# Patient Record
Sex: Male | Born: 1955 | Race: Black or African American | Hispanic: No | Marital: Married | Smoking: Current every day smoker
Health system: Southern US, Community
[De-identification: ages and names within clinical notes are randomized; demographics above are authoritative.]

## PROBLEM LIST (undated history)

## (undated) DIAGNOSIS — F250 Schizoaffective disorder, bipolar type: Secondary | ICD-10-CM

## (undated) DIAGNOSIS — K219 Gastro-esophageal reflux disease without esophagitis: Secondary | ICD-10-CM

## (undated) DIAGNOSIS — E079 Disorder of thyroid, unspecified: Secondary | ICD-10-CM

## (undated) DIAGNOSIS — E785 Hyperlipidemia, unspecified: Secondary | ICD-10-CM

## (undated) DIAGNOSIS — K589 Irritable bowel syndrome without diarrhea: Secondary | ICD-10-CM

## (undated) DIAGNOSIS — F1910 Other psychoactive substance abuse, uncomplicated: Secondary | ICD-10-CM

## (undated) DIAGNOSIS — I10 Essential (primary) hypertension: Secondary | ICD-10-CM

## (undated) DIAGNOSIS — F259 Schizoaffective disorder, unspecified: Secondary | ICD-10-CM

---

## 1998-11-26 ENCOUNTER — Encounter: Payer: Self-pay | Admitting: Emergency Medicine

## 1998-11-26 ENCOUNTER — Emergency Department (HOSPITAL_COMMUNITY): Admission: EM | Admit: 1998-11-26 | Discharge: 1998-11-26 | Payer: Self-pay | Admitting: Emergency Medicine

## 1998-11-30 ENCOUNTER — Emergency Department (HOSPITAL_COMMUNITY): Admission: EM | Admit: 1998-11-30 | Discharge: 1998-11-30 | Payer: Self-pay | Admitting: Emergency Medicine

## 2000-12-18 ENCOUNTER — Encounter: Admission: RE | Admit: 2000-12-18 | Discharge: 2000-12-18 | Payer: Self-pay | Admitting: Nephrology

## 2000-12-18 ENCOUNTER — Encounter: Payer: Self-pay | Admitting: Nephrology

## 2001-01-31 ENCOUNTER — Encounter: Payer: Self-pay | Admitting: Nephrology

## 2001-01-31 ENCOUNTER — Ambulatory Visit (HOSPITAL_COMMUNITY): Admission: RE | Admit: 2001-01-31 | Discharge: 2001-01-31 | Payer: Self-pay | Admitting: Nephrology

## 2003-08-12 ENCOUNTER — Inpatient Hospital Stay (HOSPITAL_COMMUNITY): Admission: EM | Admit: 2003-08-12 | Discharge: 2003-08-20 | Payer: Self-pay | Admitting: Emergency Medicine

## 2004-10-11 ENCOUNTER — Emergency Department (HOSPITAL_COMMUNITY): Admission: EM | Admit: 2004-10-11 | Discharge: 2004-10-11 | Payer: Self-pay | Admitting: Emergency Medicine

## 2005-08-26 ENCOUNTER — Emergency Department (HOSPITAL_COMMUNITY): Admission: EM | Admit: 2005-08-26 | Discharge: 2005-08-26 | Payer: Self-pay | Admitting: Emergency Medicine

## 2010-06-23 ENCOUNTER — Emergency Department (HOSPITAL_COMMUNITY): Admission: EM | Admit: 2010-06-23 | Discharge: 2010-06-23 | Payer: Self-pay | Admitting: Emergency Medicine

## 2010-11-01 LAB — LIPASE, BLOOD: Lipase: 24 U/L (ref 11–59)

## 2010-11-01 LAB — CBC
HCT: 45.6 % (ref 39.0–52.0)
Hemoglobin: 15.6 g/dL (ref 13.0–17.0)
MCH: 31.2 pg (ref 26.0–34.0)
MCHC: 34.3 g/dL (ref 30.0–36.0)
MCV: 90.8 fL (ref 78.0–100.0)
Platelets: 162 10*3/uL (ref 150–400)
RBC: 5.02 MIL/uL (ref 4.22–5.81)
RDW: 13.5 % (ref 11.5–15.5)
WBC: 5.3 10*3/uL (ref 4.0–10.5)

## 2010-11-01 LAB — COMPREHENSIVE METABOLIC PANEL
ALT: 30 U/L (ref 0–53)
AST: 36 U/L (ref 0–37)
Albumin: 4 g/dL (ref 3.5–5.2)
Alkaline Phosphatase: 60 U/L (ref 39–117)
BUN: 6 mg/dL (ref 6–23)
CO2: 26 mEq/L (ref 19–32)
Calcium: 9.7 mg/dL (ref 8.4–10.5)
Chloride: 107 mEq/L (ref 96–112)
Creatinine, Ser: 1.12 mg/dL (ref 0.4–1.5)
GFR calc Af Amer: >60 mL/min (ref >60)
GFR calc non Af Amer: >60 mL/min (ref >60)
Glucose, Bld: 105 mg/dL — ABNORMAL HIGH (ref 70–99)
Potassium: 3.8 mEq/L (ref 3.5–5.1)
Sodium: 142 mEq/L (ref 135–145)
Total Bilirubin: 0.7 mg/dL (ref 0.3–1.2)
Total Protein: 6.7 g/dL (ref 6.0–8.3)

## 2010-11-01 LAB — DIFFERENTIAL
Basophils Absolute: 0 10*3/uL (ref 0.0–0.1)
Basophils Relative: 1 % (ref 0–1)
Eosinophils Absolute: 0.4 10*3/uL (ref 0.0–0.7)
Eosinophils Relative: 7 % — ABNORMAL HIGH (ref 0–5)
Lymphocytes Relative: 31 % (ref 12–46)
Lymphs Abs: 1.6 10*3/uL (ref 0.7–4.0)
Monocytes Absolute: 0.6 10*3/uL (ref 0.1–1.0)
Monocytes Relative: 11 % (ref 3–12)
Neutro Abs: 2.7 10*3/uL (ref 1.7–7.7)
Neutrophils Relative %: 51 % (ref 43–77)

## 2011-01-06 NOTE — Discharge Summary (Signed)
Anthony Solis, Anthony Solis                       ACCOUNT NO.:  [id number]   MEDICAL RECORD NO.:  [id number]                   PATIENT TYPE:  INP   LOCATION:  3018                                 FACILITY:  [facility]   PHYSICIAN:  Jarome Matin, M.D.            DATE OF BIRTH:  07/17/1978   DATE OF ADMISSION:  08/12/2003  DATE OF DISCHARGE:  08/19/2003                                 DISCHARGE SUMMARY   ADMISSION DIAGNOSIS:  Persistent wheezing bilaterally with moderate  distress.   DISCHARGE DIAGNOSES:  1. Chronic bronchitis with acute exacerbation, with exacerbation of     asthmatic wheezing probably brought on by viral or bacterial bronchitis     and also is exacerbated by continued tobacco abuse, smoking.  2. The patient also has a diagnosis of paranoid schizophrenia and he resides     at the [facility].   BRIEF HISTORY AND HOSPITAL COURSE:  This is a 55 year old, I am not sure  about his age, but he says 74, he looks a little older, African-American  male who resides at the [facility].  He has a history of being a paranoid  schizophrenic and hypothyroidism.  He said he has been short of breath for  the past two weeks with wheezing and came to the emergency room on the night  of admission.  He was wheezing bilaterally with some moderate degree of  distress.  He had been given some albuterol with a nebulizer solution, Solu-  Medrol 125 mg, and he continues to wheeze with maybe some slight  improvement.  He denies coughing up sputum or anything of that nature.  His  temperature was 98.3, blood pressure 122/81, pulse was 130, respirations 28-  24.  He had bilateral wheezing, expiratory and inspiratory.  Soft, no mass,  no organomegaly.  Persistent wheezing.   He was admitted and was given nebulizer with albuterol q.6h., Solu-Medrol  125 mg IV q.6h., and he was continued on his other medications, and his  other medications included, the patient is a paranoid schizophrenic and he  was continued on his Bentyl 20 mg q.i.d., and he also has hypothyroidism on  Synthroid 50 mcg daily.  He is on niacin 500 mg daily at night, bedtime,  Remeron 15 mg tablet at h.s., and Protonix 40 mg a.c. tabs daily.  He is  also on Clozaril 125 mg three times a day.  He was given, as stated, some  albuterol for his rescue inhaler, and today the patient's chest is clear and  we are going to continue him for another seven days on the Augmentin that he  was given for his acute bronchitis, 875 mg b.i.d. for seven more days.  Continue on his medications at home mentioned, and give him Atrovent  inhaler.  He can take two puffs of that four times a day and, as stated  before, 875 mg twice a day for another seven days.  Put  him on a prednisone  Dosepak, a 10 mg Dosepak to last about seven days.  His tuberculin skin test  was negative, and Tylenol as needed for pain and also an antacid, Maalox,  q.6h. p.r.n. for his upset stomach.  The patient can continue on that and be  seen in the office.  We are going to also give him a flu shot before he is  discharged.  I do not think he has had one, so we can schedule him to do  that before discharge, and the patient can be seen in the office in three to  four weeks as needed after discharge.  I spoke with him at length about  smoking, but he did not seem very impressed.                                                Jarome Matin, M.D.    CEF/MEDQ  D:  08/19/2003  T:  08/19/2003  Job:  [id number]

## 2012-08-20 ENCOUNTER — Encounter (HOSPITAL_COMMUNITY): Payer: Self-pay | Admitting: Emergency Medicine

## 2012-08-20 DIAGNOSIS — Z79899 Other long term (current) drug therapy: Secondary | ICD-10-CM

## 2012-08-20 DIAGNOSIS — E079 Disorder of thyroid, unspecified: Secondary | ICD-10-CM | POA: Diagnosis present

## 2012-08-20 DIAGNOSIS — K8010 Calculus of gallbladder with chronic cholecystitis without obstruction: Principal | ICD-10-CM | POA: Diagnosis present

## 2012-08-20 DIAGNOSIS — F259 Schizoaffective disorder, unspecified: Secondary | ICD-10-CM | POA: Diagnosis present

## 2012-08-20 DIAGNOSIS — K219 Gastro-esophageal reflux disease without esophagitis: Secondary | ICD-10-CM | POA: Diagnosis present

## 2012-08-20 DIAGNOSIS — K589 Irritable bowel syndrome without diarrhea: Secondary | ICD-10-CM | POA: Diagnosis present

## 2012-08-20 DIAGNOSIS — I10 Essential (primary) hypertension: Secondary | ICD-10-CM | POA: Diagnosis present

## 2012-08-20 DIAGNOSIS — F1910 Other psychoactive substance abuse, uncomplicated: Secondary | ICD-10-CM | POA: Diagnosis present

## 2012-08-20 DIAGNOSIS — E785 Hyperlipidemia, unspecified: Secondary | ICD-10-CM | POA: Diagnosis present

## 2012-08-20 LAB — CBC WITH DIFFERENTIAL/PLATELET
Basophils Absolute: 0 10*3/uL (ref 0.0–0.1)
Basophils Relative: 0 % (ref 0–1)
Eosinophils Absolute: 0.2 10*3/uL (ref 0.0–0.7)
Eosinophils Relative: 3 % (ref 0–5)
HCT: 43.9 % (ref 39.0–52.0)
Hemoglobin: 14.7 g/dL (ref 13.0–17.0)
Lymphocytes Relative: 13 % (ref 12–46)
Lymphs Abs: 1.1 10*3/uL (ref 0.7–4.0)
MCH: 29.1 pg (ref 26.0–34.0)
MCHC: 33.5 g/dL (ref 30.0–36.0)
MCV: 86.8 fL (ref 78.0–100.0)
Monocytes Absolute: 0.4 10*3/uL (ref 0.1–1.0)
Monocytes Relative: 5 % (ref 3–12)
Neutro Abs: 6.6 10*3/uL (ref 1.7–7.7)
Neutrophils Relative %: 79 % — ABNORMAL HIGH (ref 43–77)
Platelets: 141 10*3/uL — ABNORMAL LOW (ref 150–400)
RBC: 5.06 MIL/uL (ref 4.22–5.81)
RDW: 13.4 % (ref 11.5–15.5)
WBC: 8.3 10*3/uL (ref 4.0–10.5)

## 2012-08-20 NOTE — ED Notes (Signed)
PT. ARRIVED WITH EMS FROM [facility] ,  REPORTS GENERALIZED ABDOMINAL PAIN WITH DIARRHEA ONSET THIS EVENING , DENIES NAUSEA OR VOMITTING .

## 2012-08-21 ENCOUNTER — Inpatient Hospital Stay (HOSPITAL_COMMUNITY)
Admission: EM | Admit: 2012-08-21 | Discharge: 2012-08-21 | DRG: 446 | Discharge status: Against Medical Advice | Payer: Medicare Other | Attending: Emergency Medicine | Admitting: Emergency Medicine

## 2012-08-21 ENCOUNTER — Emergency Department (HOSPITAL_COMMUNITY): Payer: Medicare Other

## 2012-08-21 HISTORY — DX: Other psychoactive substance abuse, uncomplicated: F19.10

## 2012-08-21 HISTORY — DX: Gastro-esophageal reflux disease without esophagitis: K21.9

## 2012-08-21 HISTORY — DX: Essential (primary) hypertension: I10

## 2012-08-21 HISTORY — DX: Schizoaffective disorder, unspecified: F25.9

## 2012-08-21 HISTORY — DX: Schizoaffective disorder, bipolar type: F25.0

## 2012-08-21 HISTORY — DX: Irritable bowel syndrome, unspecified: K58.9

## 2012-08-21 HISTORY — DX: Hyperlipidemia, unspecified: E78.5

## 2012-08-21 HISTORY — DX: Disorder of thyroid, unspecified: E07.9

## 2012-08-21 LAB — URINALYSIS, ROUTINE W REFLEX MICROSCOPIC
Glucose, UA: NEGATIVE mg/dL
Hgb urine dipstick: NEGATIVE
Ketones, ur: NEGATIVE mg/dL
Nitrite: NEGATIVE
Protein, ur: NEGATIVE mg/dL
Specific Gravity, Urine: 1.017 (ref 1.005–1.030)
Urobilinogen, UA: 1 mg/dL (ref 0.0–1.0)
pH: 8.5 — ABNORMAL HIGH (ref 5.0–8.0)

## 2012-08-21 LAB — COMPREHENSIVE METABOLIC PANEL
ALT: 126 U/L — ABNORMAL HIGH (ref 0–53)
AST: 259 U/L — ABNORMAL HIGH (ref 0–37)
Albumin: 4.2 g/dL (ref 3.5–5.2)
Alkaline Phosphatase: 89 U/L (ref 39–117)
BUN: 9 mg/dL (ref 6–23)
CO2: 31 mEq/L (ref 19–32)
Calcium: 10.2 mg/dL (ref 8.4–10.5)
Chloride: 100 mEq/L (ref 96–112)
Creatinine, Ser: 1.15 mg/dL (ref 0.50–1.35)
GFR calc Af Amer: 82 mL/min — ABNORMAL LOW (ref >90)
GFR calc non Af Amer: 70 mL/min — ABNORMAL LOW (ref >90)
Glucose, Bld: 154 mg/dL — ABNORMAL HIGH (ref 70–99)
Potassium: 3.3 mEq/L — ABNORMAL LOW (ref 3.5–5.1)
Sodium: 143 mEq/L (ref 135–145)
Total Bilirubin: 1.5 mg/dL — ABNORMAL HIGH (ref 0.3–1.2)
Total Protein: 7 g/dL (ref 6.0–8.3)

## 2012-08-21 LAB — URINE MICROSCOPIC-ADD ON

## 2012-08-21 LAB — LIPASE, BLOOD: Lipase: 28 U/L (ref 11–59)

## 2012-08-21 MED ORDER — MORPHINE SULFATE 4 MG/ML IJ SOLN
4.0000 mg | Freq: Once | INTRAMUSCULAR | Status: AC
  Administered 2012-08-21: 4 mg via INTRAVENOUS
  Filled 2012-08-21: qty 1

## 2012-08-21 MED ORDER — DEXTROSE 5 % IV SOLN
2.0000 g | INTRAVENOUS | Status: AC
  Administered 2012-08-21: 2 g via INTRAVENOUS
  Filled 2012-08-21: qty 2

## 2012-08-21 MED ORDER — DEXTROSE 5 % IV SOLN
2.0000 g | Freq: Two times a day (BID) | INTRAVENOUS | Status: DC
  Filled 2012-08-21 (×2): qty 2

## 2012-08-21 MED ORDER — FENTANYL CITRATE 0.05 MG/ML IJ SOLN
100.0000 ug | Freq: Once | INTRAMUSCULAR | Status: AC
  Administered 2012-08-21: 100 ug via INTRAVENOUS
  Filled 2012-08-21: qty 2

## 2012-08-21 NOTE — ED Notes (Signed)
RN called [facility] to transport patient

## 2012-08-21 NOTE — ED Notes (Signed)
Patient transported to Ultrasound 

## 2012-08-21 NOTE — H&P (Signed)
Anthony Solis is an 57 y.o. male.   Chief Complaint: Abdominal pain HPI: This is a 57 year old gentleman schizoaffective disorder That presents with upper abdominal pain. He is a very poor historian. The pain started last night. He has had chronic abdominal complaints for some time he reports that he has gastroenteritis. He has no nausea or vomiting. Bowel movements have been loose. He denies fevers.  Past Medical History  Diagnosis Date  . IBS (irritable bowel syndrome)   . Thyroid disease   . Schizophrenia, schizo-affective type   . Hypertension   . Polysubstance abuse   . GERD (gastroesophageal reflux disease)   . Hyperlipidemia   . Drug abuse     History reviewed. No pertinent past surgical history.  No family history on file. Social History:  reports that he has never smoked. He does not have any smokeless tobacco history on file. He reports that he does not drink alcohol or use illicit drugs.  Allergies: No Known Allergies   (Not in a hospital admission)  Results for orders placed during the hospital encounter of 08/21/12 (from the past 48 hour(s))  CBC WITH DIFFERENTIAL     Status: Abnormal   Collection Time   08/20/12 11:20 PM      Component Value Range Comment   WBC 8.3  4.0 - 10.5 K/uL    RBC 5.06  4.22 - 5.81 MIL/uL    Hemoglobin 14.7  13.0 - 17.0 g/dL    HCT 16.1  09.6 - 04.5 %    MCV 86.8  78.0 - 100.0 fL    MCH 29.1  26.0 - 34.0 pg    MCHC 33.5  30.0 - 36.0 g/dL    RDW 40.9  81.1 - 91.4 %    Platelets 141 (*) 150 - 400 K/uL    Neutrophils Relative 79 (*) 43 - 77 %    Neutro Abs 6.6  1.7 - 7.7 K/uL    Lymphocytes Relative 13  12 - 46 %    Lymphs Abs 1.1  0.7 - 4.0 K/uL    Monocytes Relative 5  3 - 12 %    Monocytes Absolute 0.4  0.1 - 1.0 K/uL    Eosinophils Relative 3  0 - 5 %    Eosinophils Absolute 0.2  0.0 - 0.7 K/uL    Basophils Relative 0  0 - 1 %    Basophils Absolute 0.0  0.0 - 0.1 K/uL   COMPREHENSIVE METABOLIC PANEL     Status: Abnormal   Collection Time   08/20/12 11:20 PM      Component Value Range Comment   Sodium 143  135 - 145 mEq/L    Potassium 3.3 (*) 3.5 - 5.1 mEq/L    Chloride 100  96 - 112 mEq/L    CO2 31  19 - 32 mEq/L    Glucose, Bld 154 (*) 70 - 99 mg/dL    BUN 9  6 - 23 mg/dL    Creatinine, Ser 7.82  0.50 - 1.35 mg/dL    Calcium 95.6  8.4 - 10.5 mg/dL    Total Protein 7.0  6.0 - 8.3 g/dL    Albumin 4.2  3.5 - 5.2 g/dL    AST 213 (*) 0 - 37 U/L    ALT 126 (*) 0 - 53 U/L    Alkaline Phosphatase 89  39 - 117 U/L    Total Bilirubin 1.5 (*) 0.3 - 1.2 mg/dL    GFR calc non Af Denyse Dago  70 (*) >90 mL/min    GFR calc Af Amer 82 (*) >90 mL/min   LIPASE, BLOOD     Status: Normal   Collection Time   08/20/12 11:20 PM      Component Value Range Comment   Lipase 28  11 - 59 U/L   URINALYSIS, ROUTINE W REFLEX MICROSCOPIC     Status: Abnormal   Collection Time   08/21/12  4:56 AM      Component Value Range Comment   Color, Urine AMBER (*) YELLOW BIOCHEMICALS MAY BE AFFECTED BY COLOR   APPearance CLEAR  CLEAR    Specific Gravity, Urine 1.017  1.005 - 1.030    pH 8.5 (*) 5.0 - 8.0    Glucose, UA NEGATIVE  NEGATIVE mg/dL    Hgb urine dipstick NEGATIVE  NEGATIVE    Bilirubin Urine SMALL (*) NEGATIVE    Ketones, ur NEGATIVE  NEGATIVE mg/dL    Protein, ur NEGATIVE  NEGATIVE mg/dL    Urobilinogen, UA 1.0  0.0 - 1.0 mg/dL    Nitrite NEGATIVE  NEGATIVE    Leukocytes, UA TRACE (*) NEGATIVE   URINE MICROSCOPIC-ADD ON     Status: Normal   Collection Time   08/21/12  4:56 AM      Component Value Range Comment   Squamous Epithelial / LPF RARE  RARE    WBC, UA 3-6  <3 WBC/hpf    RBC / HPF 0-2  <3 RBC/hpf    Bacteria, UA RARE  RARE    US Abdomen Complete  08/21/2012  *RADIOLOGY REPORT*  Clinical Data:  Abdominal pain.  Elevated LFTs.  History of hypertension, IBS, GERD.  COMPLETE ABDOMINAL ULTRASOUND  Comparison:  Ultrasound the abdomen on 06/23/2010  Findings:  Gallbladder:  The gallbladder is contracted with thickened wall.  Wall measures 4.2 mm.  An echogenic focus is identified within the fundus, possibly representing a stone and measuring 3.9 mm.  No sonographic Murphy's sign identified.  Common bile duct:  3.9 mm  Liver:  Liver is echogenic without focal mass.  IVC:  There is limited visualization of the pancreas because of overlying bowel gas.  No focal abnormality identified however.  Pancreas:  No focal abnormality seen.  Spleen:  The spleen has a normal appearance, 9.3 cm in length.  Right Kidney:  The right kidney has a normal appearance, 11.3 cm in length.  Left Kidney:  The left kidney is 11.0 cm in length and has a normal appearance.  Abdominal aorta:  2.0 cm maximum diameter, normal in appearance.  IMPRESSION:  1.  Contracted gallbladder with thickened wall. 2.  Possible gallstone in the fundal region. 3.  No sonographic Murphy's sign.   Original Report Authenticated By: Norva Pavlov, M.D.     Review of Systems  Unable to perform ROS: mental acuity  Respiratory: Negative for shortness of breath.   Cardiovascular: Negative for chest pain.    Blood pressure 112/84, pulse 98, temperature 98.3 F (36.8 C), temperature source Oral, resp. rate 19, SpO2 97.00%. Physical Exam  Constitutional: He is oriented to person, place, and time. He appears well-developed and well-nourished. No distress.  HENT:  Head: Normocephalic and atraumatic.  Mouth/Throat: Oropharynx is clear and moist.       Poor dentition  Eyes: Conjunctivae normal are normal. Pupils are equal, round, and reactive to light. Right eye exhibits no discharge. Left eye exhibits no discharge. No scleral icterus.  Neck: Normal range of motion. Neck supple. No tracheal deviation present.  Cardiovascular: Normal rate, regular rhythm, normal heart sounds and intact distal pulses.   No murmur heard. Respiratory: Effort normal and breath sounds normal. No respiratory distress.  GI: Soft. There is tenderness. There is guarding.       Abdomen is soft and  mildly obese with some mild to moderate right upper quadrant and epigastric tenderness with guarding  Musculoskeletal: Normal range of motion. He exhibits no edema.  Neurological: He is alert and oriented to person, place, and time.  Skin: Skin is warm and dry. He is not diaphoretic. No erythema.  Psychiatric:       Confused and repetitive, but apparently baseline     Assessment/Plan Acute cholecystitis with cholelithiasis  He will be admitted to the hospital for IV antibiotics and IV rehydration. His liver function tests will be repeated in the morning. He may need a GI consultation if they are increased. If not, he will be considered for a laparoscopic cholecystectomy. Currently, he may refuse surgery.  Radwan Cowley A 08/21/2012, 8:54 AM

## 2012-08-21 NOTE — ED Notes (Addendum)
Pt refuses to be admitted. Rn called group home and pt has no POA and makes all his own decisions.  MD and RN discussed pt leaving AMA and pt still refusing to stay.

## 2012-08-21 NOTE — ED Provider Notes (Signed)
History     CSN: [id number]  Arrival date & time 08/20/12  2315   First MD Initiated Contact with Patient 08/21/12 0144      Chief Complaint  Patient presents with  . Abdominal Pain    (Consider location/radiation/quality/duration/timing/severity/associated sxs/prior treatment) HPI T18-year-old male [facility] with complaints of generalized abdominal pain and some loose stools starting this evening. Patient reports pain is sharp radiates throughout his abdomen. He denies nausea or vomiting he has had no fevers. No sick contacts. No unusual foods. Patient reports he had gastroenteritis 3-4 months ago. Patient reports pain started about 5 hours ago. He waited an hour before letting anyone know about it as he was hoping it would go away on its own. No prior abdominal surgeries. Patient has history of schizoaffective/schizophrenia, and is a poor historian Past Medical History  Diagnosis Date  . IBS (irritable bowel syndrome)   . Thyroid disease   . Schizophrenia, schizo-affective type   . Hypertension   . Polysubstance abuse   . GERD (gastroesophageal reflux disease)   . Hyperlipidemia   . Drug abuse     History reviewed. No pertinent past surgical history.  No family history on file.  History  Substance Use Topics  . Smoking status: Never Smoker   . Smokeless tobacco: Not on file  . Alcohol Use: No      Review of Systems  Unable to perform ROS: Psychiatric disorder    Allergies  Review of patient's allergies indicates no known allergies.  Home Medications  No current outpatient prescriptions on file.  BP 102/70  Pulse 102  Temp 98.3 F (36.8 C) (Oral)  Resp 20  SpO2 100%  Physical Exam  Nursing note and vitals reviewed. Constitutional: He is oriented to person, place, and time. He appears well-developed and well-nourished. He appears distressed (uncomfortable appearing).  HENT:  Head: Normocephalic and  atraumatic.  Nose: Nose normal.  Mouth/Throat: Oropharynx is clear and moist.       Poor dentition  Eyes: Conjunctivae normal and EOM are normal. Pupils are equal, round, and reactive to light.  Neck: Normal range of motion. Neck supple. No JVD present. No tracheal deviation present. No thyromegaly present.  Cardiovascular: Normal rate, regular rhythm, normal heart sounds and intact distal pulses.  Exam reveals no gallop and no friction rub.   No murmur heard. Pulmonary/Chest: Effort normal and breath sounds normal. No stridor. No respiratory distress. He has no wheezes. He has no rales. He exhibits no tenderness.  Abdominal: Soft. Bowel sounds are normal. He exhibits no distension and no mass. There is tenderness (diffuse tenderness throughout the abdomen with increased tenderness across upper abdomen right upper quadrant and epigastrium). There is no rebound and no guarding.  Musculoskeletal: Normal range of motion. He exhibits no edema and no tenderness.  Lymphadenopathy:    He has no cervical adenopathy.  Neurological: He is alert and oriented to person, place, and time. He exhibits normal muscle tone. Coordination normal.  Skin: Skin is warm and dry. No rash noted. No erythema. No pallor.  Psychiatric: He has a normal mood and affect. His behavior is normal. Judgment and thought content normal.    ED Course  Procedures (including critical care time)  Labs Reviewed  CBC WITH DIFFERENTIAL - Abnormal; Notable for the following:    Platelets 141 (*)     Neutrophils Relative 79 (*)     All other components within normal limits  COMPREHENSIVE  METABOLIC PANEL - Abnormal; Notable for the following:    Potassium 3.3 (*)     Glucose, Bld 154 (*)     AST 259 (*)     ALT 126 (*)     Total Bilirubin 1.5 (*)     GFR calc non Af Amer 70 (*)     GFR calc Af Amer 82 (*)     All other components within normal limits  URINALYSIS, ROUTINE W REFLEX MICROSCOPIC  LIPASE, BLOOD   No results  found.   No diagnosis found.    MDM  57 year old male with transaminitis and abdominal pain. Concern for cholelithiasis. Lipase is pending. Treat pain, get ultrasound of abdomen.  U/s pending, Care passed to Dr Radford Pax awaiting u/s.          Olivia Mackie, MD 08/21/12 2104

## 2013-09-26 ENCOUNTER — Encounter (HOSPITAL_COMMUNITY): Payer: Self-pay | Admitting: Emergency Medicine

## 2013-09-26 ENCOUNTER — Emergency Department (HOSPITAL_COMMUNITY)
Admission: EM | Admit: 2013-09-26 | Discharge: 2013-09-26 | Disposition: A | Discharge status: Home / Self Care | Payer: Medicare Other | Attending: Emergency Medicine | Admitting: Emergency Medicine

## 2013-09-26 ENCOUNTER — Emergency Department (HOSPITAL_COMMUNITY): Payer: Medicare Other

## 2013-09-26 DIAGNOSIS — J209 Acute bronchitis, unspecified: Secondary | ICD-10-CM | POA: Insufficient documentation

## 2013-09-26 DIAGNOSIS — E079 Disorder of thyroid, unspecified: Secondary | ICD-10-CM | POA: Insufficient documentation

## 2013-09-26 DIAGNOSIS — F259 Schizoaffective disorder, unspecified: Secondary | ICD-10-CM | POA: Insufficient documentation

## 2013-09-26 DIAGNOSIS — Z79899 Other long term (current) drug therapy: Secondary | ICD-10-CM | POA: Insufficient documentation

## 2013-09-26 DIAGNOSIS — E785 Hyperlipidemia, unspecified: Secondary | ICD-10-CM | POA: Insufficient documentation

## 2013-09-26 DIAGNOSIS — K219 Gastro-esophageal reflux disease without esophagitis: Secondary | ICD-10-CM | POA: Insufficient documentation

## 2013-09-26 DIAGNOSIS — F17200 Nicotine dependence, unspecified, uncomplicated: Secondary | ICD-10-CM | POA: Insufficient documentation

## 2013-09-26 DIAGNOSIS — I10 Essential (primary) hypertension: Secondary | ICD-10-CM | POA: Insufficient documentation

## 2013-09-26 DIAGNOSIS — K5900 Constipation, unspecified: Secondary | ICD-10-CM | POA: Insufficient documentation

## 2013-09-26 DIAGNOSIS — Z720 Tobacco use: Secondary | ICD-10-CM

## 2013-09-26 LAB — CBC WITH DIFFERENTIAL/PLATELET
Basophils Absolute: 0 10*3/uL (ref 0.0–0.1)
Basophils Relative: 0 % (ref 0–1)
EOS PCT: 0 % (ref 0–5)
Eosinophils Absolute: 0 10*3/uL (ref 0.0–0.7)
HEMATOCRIT: 42.7 % (ref 39.0–52.0)
HEMOGLOBIN: 15.1 g/dL (ref 13.0–17.0)
LYMPHS ABS: 1.6 10*3/uL (ref 0.7–4.0)
Lymphocytes Relative: 17 % (ref 12–46)
MCH: 31.3 pg (ref 26.0–34.0)
MCHC: 35.4 g/dL (ref 30.0–36.0)
MCV: 88.4 fL (ref 78.0–100.0)
MONOS PCT: 16 % — AB (ref 3–12)
Monocytes Absolute: 1.6 10*3/uL — ABNORMAL HIGH (ref 0.1–1.0)
NEUTROS ABS: 6.5 10*3/uL (ref 1.7–7.7)
Neutrophils Relative %: 67 % (ref 43–77)
Platelets: 115 10*3/uL — ABNORMAL LOW (ref 150–400)
RBC: 4.83 MIL/uL (ref 4.22–5.81)
RDW: 13.2 % (ref 11.5–15.5)
WBC: 9.7 10*3/uL (ref 4.0–10.5)

## 2013-09-26 LAB — COMPREHENSIVE METABOLIC PANEL
ALT: 41 U/L (ref 0–53)
AST: 116 U/L — ABNORMAL HIGH (ref 0–37)
Albumin: 3.8 g/dL (ref 3.5–5.2)
Alkaline Phosphatase: 38 U/L — ABNORMAL LOW (ref 39–117)
BILIRUBIN TOTAL: 1.5 mg/dL — AB (ref 0.3–1.2)
BUN: 19 mg/dL (ref 6–23)
CALCIUM: 9.4 mg/dL (ref 8.4–10.5)
CO2: 28 meq/L (ref 19–32)
CREATININE: 1.23 mg/dL (ref 0.50–1.35)
Chloride: 92 mEq/L — ABNORMAL LOW (ref 96–112)
GFR, EST AFRICAN AMERICAN: 75 mL/min — AB (ref >90)
GFR, EST NON AFRICAN AMERICAN: 64 mL/min — AB (ref >90)
GLUCOSE: 134 mg/dL — AB (ref 70–99)
Potassium: 3.3 mEq/L — ABNORMAL LOW (ref 3.7–5.3)
SODIUM: 135 meq/L — AB (ref 137–147)
Total Protein: 7.3 g/dL (ref 6.0–8.3)

## 2013-09-26 MED ORDER — ALBUTEROL SULFATE (2.5 MG/3ML) 0.083% IN NEBU
2.5000 mg | INHALATION_SOLUTION | Freq: Once | RESPIRATORY_TRACT | Status: AC
  Administered 2013-09-26: 2.5 mg via RESPIRATORY_TRACT
  Filled 2013-09-26: qty 3

## 2013-09-26 MED ORDER — IPRATROPIUM BROMIDE 0.02 % IN SOLN
0.5000 mg | Freq: Once | RESPIRATORY_TRACT | Status: AC
  Administered 2013-09-26: 0.5 mg via RESPIRATORY_TRACT
  Filled 2013-09-26: qty 2.5

## 2013-09-26 MED ORDER — MAGNESIUM CITRATE PO SOLN
1.0000 | Freq: Once | ORAL | Status: AC
  Administered 2013-09-26: 1 via ORAL
  Filled 2013-09-26 (×2): qty 296

## 2013-09-26 MED ORDER — PREDNISONE 20 MG PO TABS
60.0000 mg | ORAL_TABLET | Freq: Once | ORAL | Status: AC
Start: 2013-09-26 — End: 2013-09-26
  Administered 2013-09-26: 60 mg via ORAL
  Filled 2013-09-26: qty 3

## 2013-09-26 MED ORDER — PREDNISONE 20 MG PO TABS
60.0000 mg | ORAL_TABLET | Freq: Every day | ORAL | Status: DC
Start: 2013-09-26 — End: 2016-12-05

## 2013-09-26 MED ORDER — BENZONATATE 100 MG PO CAPS
200.0000 mg | ORAL_CAPSULE | Freq: Three times a day (TID) | ORAL | Status: DC | PRN
  Administered 2013-09-26: 200 mg via ORAL
  Filled 2013-09-26 (×2): qty 2

## 2013-09-26 MED ORDER — BENZONATATE 200 MG PO CAPS: 200.0000 mg | ORAL_CAPSULE | Freq: Three times a day (TID) | ORAL | Status: DC | PRN

## 2013-09-26 MED ORDER — POLYETHYLENE GLYCOL 3350 17 G PO PACK: 17.0000 g | PACK | Freq: Every day | ORAL | Status: DC | PRN

## 2013-09-26 MED ORDER — DM-GUAIFENESIN ER 30-600 MG PO TB12: 1.0000 | ORAL_TABLET | Freq: Two times a day (BID) | ORAL | Status: DC

## 2013-09-26 MED ORDER — FLEET ENEMA 7-19 GM/118ML RE ENEM
1.0000 | ENEMA | Freq: Once | RECTAL | Status: AC
  Administered 2013-09-26: 1 via RECTAL
  Filled 2013-09-26: qty 1

## 2013-09-26 MED ORDER — SENNA-DOCUSATE SODIUM 8.6-50 MG PO TABS: 2.0000 | ORAL_TABLET | Freq: Every day | ORAL | Status: DC

## 2013-09-26 MED ORDER — ALBUTEROL SULFATE HFA 108 (90 BASE) MCG/ACT IN AERS
1.0000 | INHALATION_SPRAY | RESPIRATORY_TRACT | Status: DC | PRN
  Administered 2013-09-26: 2 via RESPIRATORY_TRACT
  Filled 2013-09-26: qty 6.7

## 2013-09-26 NOTE — ED Notes (Signed)
Enema given lying on his left side.  Instructed to hold it ASAP

## 2013-09-26 NOTE — ED Notes (Signed)
Ambulated to the BR without difficulty.

## 2013-09-26 NOTE — ED Notes (Signed)
Patient transported to CT 

## 2013-09-26 NOTE — Discharge Instructions (Signed)
Acute Bronchitis Bronchitis is when the airways that extend from the windpipe into the lungs get red, puffy, and painful (inflamed). Bronchitis often causes thick spit (mucus) to develop. This leads to a cough. A cough is the most common symptom of bronchitis. In acute bronchitis, the condition usually begins suddenly and goes away over time (usually in 2 weeks). Smoking, allergies, and asthma can make bronchitis worse. Repeated episodes of bronchitis may cause more lung problems. HOME CARE  Rest.  Drink enough fluids to keep your pee (urine) clear or pale yellow (unless you need to limit fluids as told by your doctor).  Only take over-the-counter or prescription medicines as told by your doctor.  Avoid smoking and secondhand smoke. These can make bronchitis worse. If you are a smoker, think about using nicotine gum or skin patches. Quitting smoking will help your lungs heal faster.  Reduce the chance of getting bronchitis again by:  Washing your hands often.  Avoiding people with cold symptoms.  Trying not to touch your hands to your mouth, nose, or eyes.  Follow up with your doctor as told. GET HELP IF: Your symptoms do not improve after 1 week of treatment. Symptoms include:  Cough.  Fever.  Coughing up thick spit.  Body aches.  Chest congestion.  Chills.  Shortness of breath.  Sore throat. GET HELP RIGHT AWAY IF:   You have an increased fever.  You have chills.  You have severe shortness of breath.  You have bloody thick spit (sputum).  You throw up (vomit) often.  You lose too much body fluid (dehydration).  You have a severe headache.  You faint. MAKE SURE YOU:   Understand these instructions.  Will watch your condition.  Will get help right away if you are not doing well or get worse. Document Released: 01/24/2008 Document Revised: 04/09/2013 Document Reviewed: 01/28/2013 [facility] Patient Information 2014 [place], [place].  Constipation,  Adult Constipation is when a person:  Poops (bowel movement) less than 3 times a week.  Has a hard time pooping.  Has poop that is dry, hard, or bigger than normal. HOME CARE   Eat more fiber, such as fruits, vegetables, whole grains like brown rice, and beans.  Eat less fatty foods and sugar. This includes Jamaica[place] fries, hamburgers, cookies, candy, and soda.  If you are not getting enough fiber from food, take products with added fiber in them (supplements).  Drink enough fluid to keep your pee (urine) clear or pale yellow.  Go to the restroom when you feel like you need to poop. Do not hold it.  Only take medicine as told by your doctor. Do not take medicines that help you poop (laxatives) without talking to your doctor first.  Exercise on a regular basis, or as told by your doctor. GET HELP RIGHT AWAY IF:   You have bright red blood in your poop (stool).  Your constipation lasts more than 4 days or gets worse.  You have belly (abdomen) or butt (rectal) pain.  You have thin poop (as thin as a pencil).  You lose weight, and it cannot be explained. MAKE SURE YOU:   Understand these instructions.  Will watch your condition.  Will get help right away if you are not doing well or get worse. Document Released: 01/24/2008 Document Revised: 10/30/2011 Document Reviewed: 05/19/2013 [facility] Patient Information 2014 [place], [place].

## 2013-09-26 NOTE — ED Notes (Signed)
[facility].

## 2013-09-26 NOTE — ED Notes (Signed)
Patient presents with cough and congestion.  Also states he hasn't had a BM times 3 days, abd distended per patient.

## 2013-09-26 NOTE — ED Provider Notes (Signed)
CSN: [id number]     Arrival date & time 09/26/13  0307 History   First MD Initiated Contact with Patient 09/26/13 [phone]     Chief Complaint  Patient presents with  . Cough  . Abdominal Pain   (Consider location/radiation/quality/duration/timing/severity/associated sxs/prior Treatment) HPI 58 yo male presents to the ER from home with complaint of 2-3 days of cough, congestion, intermittent low grade fevers, and constipation.  Pt has taken tylenol with some relief.  No reported sick contacts.  Cough is productive of yellow sputum.  Pt is 1ppd smoker.  No abdominal pain, no dysuria.  Some mild abd distension.  No meds for constipation taken.    Past Medical History  Diagnosis Date  . IBS (irritable bowel syndrome)   . Thyroid disease   . Schizophrenia, schizo-affective type   . Hypertension   . Polysubstance abuse   . GERD (gastroesophageal reflux disease)   . Hyperlipidemia   . Drug abuse    History reviewed. No pertinent past surgical history. History reviewed. No pertinent family history. History  Substance Use Topics  . Smoking status: Current Every Day Smoker  . Smokeless tobacco: Not on file  . Alcohol Use: Yes    Review of Systems  All other systems reviewed and are negative.    Allergies  Review of patient's allergies indicates no known allergies.  Home Medications   Current Outpatient Rx  Name  Route  Sig  Dispense  Refill  . cholecalciferol (VITAMIN D) 400 UNITS TABS tablet   Oral   Take 400 Units by mouth daily.         . cloZAPine (CLOZARIL) 100 MG tablet   Oral   Take 200 mg by mouth at bedtime.         . clozapine (CLOZARIL) 50 MG tablet   Oral   Take 50 mg by mouth daily.         Marland Kitchen dicyclomine (BENTYL) 20 MG tablet   Oral   Take 20 mg by mouth every 6 (six) hours.         . Fenofibrate (FENOGLIDE) 120 MG TABS   Oral   Take 120 mg by mouth daily.         . hydrochlorothiazide (HYDRODIURIL) 50 MG tablet   Oral   Take 50 mg by mouth  daily.         Marland Kitchen levothyroxine (SYNTHROID, LEVOTHROID) 50 MCG tablet   Oral   Take 50 mcg by mouth daily.         . Multiple Vitamin (MULTIVITAMIN) tablet   Oral   Take 1 tablet by mouth daily.         . polyethylene glycol (MIRALAX / GLYCOLAX) packet   Oral   Take 17 g by mouth daily as needed. constipation         . ranitidine (ZANTAC) 150 MG tablet   Oral   Take 150 mg by mouth daily.         . sennosides-docusate sodium (SENOKOT-S) 8.6-50 MG tablet   Oral   Take 1 tablet by mouth daily.         . simvastatin (ZOCOR) 20 MG tablet   Oral   Take 20 mg by mouth every evening.         . potassium chloride SA (K-DUR,KLOR-CON) 20 MEQ tablet   Oral   Take 40 mEq by mouth daily.          BP 127/94  Pulse 105  Temp(Src) 97.5  F (36.4 C) (Oral)  Resp 20  Ht 6\' 1"  (1.854 m)  Wt 200 lb (90.719 kg)  BMI 26.39 kg/m2  SpO2 93% Physical Exam  Nursing note and vitals reviewed. Constitutional: He is oriented to person, place, and time. He appears well-developed and well-nourished. No distress.  HENT:  Head: Normocephalic and atraumatic.  Nose: Nose normal.  Mouth/Throat: Oropharynx is clear and moist.  Eyes: Conjunctivae and EOM are normal. Pupils are equal, round, and reactive to light.  Neck: Normal range of motion. Neck supple. No JVD present. No tracheal deviation present. No thyromegaly present.  Cardiovascular: Normal rate, regular rhythm, normal heart sounds and intact distal pulses.  Exam reveals no gallop and no friction rub.   No murmur heard. Pulmonary/Chest: Effort normal. No stridor. No respiratory distress. He has wheezes. He has no rales. He exhibits no tenderness.  Abdominal: Soft. Bowel sounds are normal. He exhibits distension. He exhibits no mass. There is no tenderness. There is no rebound and no guarding.  Musculoskeletal: Normal range of motion. He exhibits no edema and no tenderness.  Lymphadenopathy:    He has no cervical adenopathy.   Neurological: He is alert and oriented to person, place, and time. He exhibits normal muscle tone. Coordination normal.  Skin: Skin is warm and dry. No rash noted. No erythema. No pallor.  Psychiatric: He has a normal mood and affect. His behavior is normal. Judgment and thought content normal.    ED Course  Procedures (including critical care time) Labs Review Labs Reviewed  CBC WITH DIFFERENTIAL - Abnormal; Notable for the following:    Platelets 115 (*)    Monocytes Relative 16 (*)    Monocytes Absolute 1.6 (*)    All other components within normal limits  COMPREHENSIVE METABOLIC PANEL - Abnormal; Notable for the following:    Sodium 135 (*)    Potassium 3.3 (*)    Chloride 92 (*)    Glucose, Bld 134 (*)    AST 116 (*)    Alkaline Phosphatase 38 (*)    Total Bilirubin 1.5 (*)    GFR calc non Af Amer 64 (*)    GFR calc Af Amer 75 (*)    All other components within normal limits   Imaging Review Dg Abd Acute W/chest  09/26/2013   CLINICAL DATA:  Cough.  Shortness of breath.  Abdominal distention.  EXAM: ACUTE ABDOMEN SERIES (ABDOMEN 2 VIEW & CHEST 1 VIEW)  COMPARISON:  06/23/2010 chest x-ray  FINDINGS: There is chronic interstitial coarsening with focal subpleural scarring at the right apex. No edema or consolidation. No effusion or pneumothorax. No acute osseous findings.  Large volume of formed stool, distending multiple segments of colon. No bowel obstruction. No abnormal intra-abdominal mass effect or calcification. No pneumoperitoneum. Negative osseous structures.  IMPRESSION: 1. Constipation. 2. Chronic bronchitic change.  No acute cardiopulmonary disease.   Electronically Signed   By: Tiburcio PeaJonathan  Watts M.D.   On: 09/26/2013 04:38    EKG Interpretation   None       MDM   1. Acute bronchitis   2. Tobacco use   3. Constipation    58 yo male with wheezing, cough, h/o tobacco abuse along with constipation.  CXR with probable bronchitis, only mild improvement after first  neb.  Will give second neb with prednisone, fleets enema, mag citrate.  Plan to d/c home with miralax, albuterol mdi, prednisone burst.    Olivia Mackielga M Artemio Dobie, MD 09/26/13 [phone]

## 2013-09-26 NOTE — ED Notes (Signed)
EMS reports CBG 142, Pulse 110, Resp  20

## 2013-09-26 NOTE — ED Notes (Signed)
Patient denies N/V/D. 

## 2016-08-21 DIAGNOSIS — B182 Chronic viral hepatitis C: Secondary | ICD-10-CM | POA: Insufficient documentation

## 2016-12-03 ENCOUNTER — Encounter (HOSPITAL_COMMUNITY): Payer: Self-pay | Admitting: *Deleted

## 2016-12-03 ENCOUNTER — Emergency Department (HOSPITAL_COMMUNITY): Payer: Medicare Other

## 2016-12-03 ENCOUNTER — Inpatient Hospital Stay (HOSPITAL_COMMUNITY)
Admission: EM | Admit: 2016-12-03 | Discharge: 2016-12-05 | DRG: 871 | Disposition: A | Discharge status: Home / Self Care | Payer: Medicare Other | Attending: Internal Medicine | Admitting: Internal Medicine

## 2016-12-03 DIAGNOSIS — R17 Unspecified jaundice: Secondary | ICD-10-CM | POA: Diagnosis present

## 2016-12-03 DIAGNOSIS — R740 Nonspecific elevation of levels of transaminase and lactic acid dehydrogenase [LDH]: Secondary | ICD-10-CM | POA: Diagnosis present

## 2016-12-03 DIAGNOSIS — A4189 Other specified sepsis: Secondary | ICD-10-CM | POA: Diagnosis present

## 2016-12-03 DIAGNOSIS — I951 Orthostatic hypotension: Secondary | ICD-10-CM

## 2016-12-03 DIAGNOSIS — A419 Sepsis, unspecified organism: Secondary | ICD-10-CM | POA: Diagnosis not present

## 2016-12-03 DIAGNOSIS — J111 Influenza due to unidentified influenza virus with other respiratory manifestations: Secondary | ICD-10-CM | POA: Diagnosis not present

## 2016-12-03 DIAGNOSIS — K219 Gastro-esophageal reflux disease without esophagitis: Secondary | ICD-10-CM | POA: Diagnosis present

## 2016-12-03 DIAGNOSIS — F259 Schizoaffective disorder, unspecified: Secondary | ICD-10-CM | POA: Diagnosis present

## 2016-12-03 DIAGNOSIS — Z79899 Other long term (current) drug therapy: Secondary | ICD-10-CM | POA: Diagnosis not present

## 2016-12-03 DIAGNOSIS — E039 Hypothyroidism, unspecified: Secondary | ICD-10-CM | POA: Diagnosis present

## 2016-12-03 DIAGNOSIS — E861 Hypovolemia: Secondary | ICD-10-CM | POA: Diagnosis present

## 2016-12-03 DIAGNOSIS — E785 Hyperlipidemia, unspecified: Secondary | ICD-10-CM | POA: Diagnosis present

## 2016-12-03 DIAGNOSIS — F17200 Nicotine dependence, unspecified, uncomplicated: Secondary | ICD-10-CM | POA: Diagnosis present

## 2016-12-03 DIAGNOSIS — D696 Thrombocytopenia, unspecified: Secondary | ICD-10-CM | POA: Diagnosis present

## 2016-12-03 DIAGNOSIS — E871 Hypo-osmolality and hyponatremia: Secondary | ICD-10-CM

## 2016-12-03 DIAGNOSIS — E038 Other specified hypothyroidism: Secondary | ICD-10-CM | POA: Diagnosis not present

## 2016-12-03 DIAGNOSIS — J9601 Acute respiratory failure with hypoxia: Secondary | ICD-10-CM | POA: Diagnosis present

## 2016-12-03 DIAGNOSIS — I10 Essential (primary) hypertension: Secondary | ICD-10-CM | POA: Diagnosis present

## 2016-12-03 DIAGNOSIS — F209 Schizophrenia, unspecified: Secondary | ICD-10-CM

## 2016-12-03 DIAGNOSIS — R55 Syncope and collapse: Secondary | ICD-10-CM | POA: Diagnosis not present

## 2016-12-03 DIAGNOSIS — J101 Influenza due to other identified influenza virus with other respiratory manifestations: Secondary | ICD-10-CM | POA: Diagnosis present

## 2016-12-03 LAB — URINALYSIS, ROUTINE W REFLEX MICROSCOPIC
BILIRUBIN URINE: NEGATIVE
GLUCOSE, UA: NEGATIVE mg/dL
Ketones, ur: NEGATIVE mg/dL
Leukocytes, UA: NEGATIVE
Nitrite: NEGATIVE
PH: 6 (ref 5.0–8.0)
PROTEIN: NEGATIVE mg/dL
SQUAMOUS EPITHELIAL / LPF: NONE SEEN
Specific Gravity, Urine: 1.011 (ref 1.005–1.030)

## 2016-12-03 LAB — RAPID URINE DRUG SCREEN, HOSP PERFORMED
AMPHETAMINES: NOT DETECTED
BARBITURATES: NOT DETECTED
Benzodiazepines: NOT DETECTED
Cocaine: NOT DETECTED
Opiates: NOT DETECTED
Tetrahydrocannabinol: NOT DETECTED

## 2016-12-03 LAB — CBC
HCT: 41.2 % (ref 39.0–52.0)
HEMOGLOBIN: 13.7 g/dL (ref 13.0–17.0)
MCH: 29.1 pg (ref 26.0–34.0)
MCHC: 33.3 g/dL (ref 30.0–36.0)
MCV: 87.5 fL (ref 78.0–100.0)
Platelets: 47 10*3/uL — ABNORMAL LOW (ref 150–400)
RBC: 4.71 MIL/uL (ref 4.22–5.81)
RDW: 13.6 % (ref 11.5–15.5)
WBC: 7.8 10*3/uL (ref 4.0–10.5)

## 2016-12-03 LAB — TROPONIN I: Troponin I: <0.03 ng/mL (ref <0.03)

## 2016-12-03 LAB — COMPREHENSIVE METABOLIC PANEL
ALT: 28 U/L (ref 17–63)
AST: 99 U/L — ABNORMAL HIGH (ref 15–41)
Albumin: 3.9 g/dL (ref 3.5–5.0)
Alkaline Phosphatase: 31 U/L — ABNORMAL LOW (ref 38–126)
Anion gap: 10 (ref 5–15)
BUN: 18 mg/dL (ref 6–20)
CO2: 25 mmol/L (ref 22–32)
CREATININE: 1.27 mg/dL — AB (ref 0.61–1.24)
Calcium: 8.6 mg/dL — ABNORMAL LOW (ref 8.9–10.3)
Chloride: 98 mmol/L — ABNORMAL LOW (ref 101–111)
GFR calc non Af Amer: >60 mL/min (ref >60)
Glucose, Bld: 116 mg/dL — ABNORMAL HIGH (ref 65–99)
Potassium: 4.6 mmol/L (ref 3.5–5.1)
SODIUM: 133 mmol/L — AB (ref 135–145)
Total Bilirubin: 2.4 mg/dL — ABNORMAL HIGH (ref 0.3–1.2)
Total Protein: 6.7 g/dL (ref 6.5–8.1)

## 2016-12-03 LAB — I-STAT TROPONIN, ED: TROPONIN I, POC: 0.01 ng/mL (ref 0.00–0.08)

## 2016-12-03 LAB — DIFFERENTIAL
BASOS ABS: 0 10*3/uL (ref 0.0–0.1)
Basophils Relative: 0 %
EOS ABS: 0 10*3/uL (ref 0.0–0.7)
Eosinophils Relative: 1 %
LYMPHS ABS: 1.4 10*3/uL (ref 0.7–4.0)
LYMPHS PCT: 18 %
Monocytes Absolute: 0.6 10*3/uL (ref 0.1–1.0)
Monocytes Relative: 8 %
Neutro Abs: 5.7 10*3/uL (ref 1.7–7.7)
Neutrophils Relative %: 73 %

## 2016-12-03 LAB — INFLUENZA PANEL BY PCR (TYPE A & B)
Influenza A By PCR: NEGATIVE
Influenza B By PCR: POSITIVE — AB

## 2016-12-03 LAB — I-STAT CG4 LACTIC ACID, ED
Lactic Acid, Venous: 1.67 mmol/L (ref 0.5–1.9)
Lactic Acid, Venous: 1.53 mmol/L (ref 0.5–1.9)

## 2016-12-03 LAB — MRSA PCR SCREENING: MRSA by PCR: NEGATIVE

## 2016-12-03 LAB — VITAMIN B12: Vitamin B-12: 790 pg/mL (ref 180–914)

## 2016-12-03 MED ORDER — CLOZAPINE 100 MG PO TABS
200.0000 mg | ORAL_TABLET | Freq: Every day | ORAL | Status: DC
  Administered 2016-12-03 – 2016-12-04 (×2): 200 mg via ORAL
  Filled 2016-12-03 (×2): qty 2

## 2016-12-03 MED ORDER — DICYCLOMINE HCL 20 MG PO TABS
20.0000 mg | ORAL_TABLET | Freq: Four times a day (QID) | ORAL | Status: DC
  Administered 2016-12-03 – 2016-12-05 (×8): 20 mg via ORAL
  Filled 2016-12-03 (×10): qty 1

## 2016-12-03 MED ORDER — IPRATROPIUM-ALBUTEROL 0.5-2.5 (3) MG/3ML IN SOLN
3.0000 mL | Freq: Once | RESPIRATORY_TRACT | Status: AC
  Administered 2016-12-03: 3 mL via RESPIRATORY_TRACT
  Filled 2016-12-03: qty 3

## 2016-12-03 MED ORDER — CLOZAPINE 100 MG PO TABS
100.0000 mg | ORAL_TABLET | Freq: Every day | ORAL | Status: DC
  Administered 2016-12-04 – 2016-12-05 (×2): 100 mg via ORAL
  Filled 2016-12-03 (×2): qty 1

## 2016-12-03 MED ORDER — ALBUTEROL SULFATE (2.5 MG/3ML) 0.083% IN NEBU: 2.5000 mg | INHALATION_SOLUTION | RESPIRATORY_TRACT | Status: DC | PRN

## 2016-12-03 MED ORDER — METHYLPREDNISOLONE SODIUM SUCC 125 MG IJ SOLR
125.0000 mg | Freq: Once | INTRAMUSCULAR | Status: AC
  Administered 2016-12-03: 125 mg via INTRAVENOUS
  Filled 2016-12-03: qty 2

## 2016-12-03 MED ORDER — ONDANSETRON HCL 4 MG/2ML IJ SOLN: 4.0000 mg | Freq: Four times a day (QID) | INTRAMUSCULAR | Status: DC | PRN

## 2016-12-03 MED ORDER — SODIUM CHLORIDE 0.9 % IV BOLUS (SEPSIS)
1000.0000 mL | Freq: Once | INTRAVENOUS | Status: AC
  Administered 2016-12-03: 1000 mL via INTRAVENOUS

## 2016-12-03 MED ORDER — DOCUSATE SODIUM 100 MG PO CAPS
100.0000 mg | ORAL_CAPSULE | Freq: Every day | ORAL | Status: DC
Start: 2016-12-04 — End: 2016-12-05
  Administered 2016-12-04 – 2016-12-05 (×2): 100 mg via ORAL
  Filled 2016-12-03 (×2): qty 1

## 2016-12-03 MED ORDER — IPRATROPIUM-ALBUTEROL 0.5-2.5 (3) MG/3ML IN SOLN
3.0000 mL | Freq: Three times a day (TID) | RESPIRATORY_TRACT | Status: DC
  Administered 2016-12-03 – 2016-12-05 (×6): 3 mL via RESPIRATORY_TRACT
  Filled 2016-12-03 (×6): qty 3

## 2016-12-03 MED ORDER — ACETAMINOPHEN 325 MG PO TABS
650.0000 mg | ORAL_TABLET | Freq: Four times a day (QID) | ORAL | Status: DC | PRN
  Administered 2016-12-05: 650 mg via ORAL
  Filled 2016-12-03: qty 2

## 2016-12-03 MED ORDER — SENNOSIDES-DOCUSATE SODIUM 8.6-50 MG PO TABS
2.0000 | ORAL_TABLET | Freq: Every day | ORAL | Status: DC
  Administered 2016-12-03 – 2016-12-05 (×3): 2 via ORAL
  Filled 2016-12-03 (×3): qty 2

## 2016-12-03 MED ORDER — ALBUTEROL SULFATE (2.5 MG/3ML) 0.083% IN NEBU: 2.5000 mg | INHALATION_SOLUTION | Freq: Four times a day (QID) | RESPIRATORY_TRACT | Status: DC

## 2016-12-03 MED ORDER — AZITHROMYCIN 250 MG PO TABS
500.0000 mg | ORAL_TABLET | Freq: Once | ORAL | Status: AC
  Administered 2016-12-03: 500 mg via ORAL
  Filled 2016-12-03: qty 2

## 2016-12-03 MED ORDER — LEVOTHYROXINE SODIUM 50 MCG PO TABS
50.0000 ug | ORAL_TABLET | Freq: Every day | ORAL | Status: DC
  Administered 2016-12-04 – 2016-12-05 (×2): 50 ug via ORAL
  Filled 2016-12-03 (×2): qty 1

## 2016-12-03 MED ORDER — ACETAMINOPHEN 650 MG RE SUPP: 650.0000 mg | Freq: Four times a day (QID) | RECTAL | Status: DC | PRN

## 2016-12-03 MED ORDER — OSELTAMIVIR PHOSPHATE 75 MG PO CAPS
75.0000 mg | ORAL_CAPSULE | Freq: Once | ORAL | Status: AC
  Administered 2016-12-03: 75 mg via ORAL
  Filled 2016-12-03: qty 1

## 2016-12-03 MED ORDER — ACETAMINOPHEN 500 MG PO TABS
1000.0000 mg | ORAL_TABLET | Freq: Once | ORAL | Status: AC
  Administered 2016-12-03: 1000 mg via ORAL
  Filled 2016-12-03: qty 2

## 2016-12-03 MED ORDER — OSELTAMIVIR PHOSPHATE 75 MG PO CAPS
75.0000 mg | ORAL_CAPSULE | Freq: Two times a day (BID) | ORAL | Status: DC
  Administered 2016-12-03 – 2016-12-05 (×4): 75 mg via ORAL
  Filled 2016-12-03 (×4): qty 1

## 2016-12-03 MED ORDER — CHOLECALCIFEROL 10 MCG (400 UNIT) PO TABS
400.0000 [IU] | ORAL_TABLET | Freq: Every day | ORAL | Status: DC
  Administered 2016-12-04 – 2016-12-05 (×2): 400 [IU] via ORAL
  Filled 2016-12-03 (×2): qty 1

## 2016-12-03 MED ORDER — FENOFIBRATE 54 MG PO TABS
54.0000 mg | ORAL_TABLET | Freq: Every day | ORAL | Status: DC
  Administered 2016-12-04 – 2016-12-05 (×2): 54 mg via ORAL
  Filled 2016-12-03 (×2): qty 1

## 2016-12-03 MED ORDER — DM-GUAIFENESIN ER 30-600 MG PO TB12
1.0000 | ORAL_TABLET | Freq: Two times a day (BID) | ORAL | Status: DC
  Administered 2016-12-03 – 2016-12-05 (×5): 1 via ORAL
  Filled 2016-12-03 (×5): qty 1

## 2016-12-03 MED ORDER — ONDANSETRON HCL 4 MG PO TABS: 4.0000 mg | ORAL_TABLET | Freq: Four times a day (QID) | ORAL | Status: DC | PRN

## 2016-12-03 MED ORDER — SIMVASTATIN 20 MG PO TABS
20.0000 mg | ORAL_TABLET | Freq: Every evening | ORAL | Status: DC
  Administered 2016-12-03 – 2016-12-04 (×2): 20 mg via ORAL
  Filled 2016-12-03 (×2): qty 1

## 2016-12-03 MED ORDER — FAMOTIDINE 20 MG PO TABS
20.0000 mg | ORAL_TABLET | Freq: Every day | ORAL | Status: DC
  Administered 2016-12-04 – 2016-12-05 (×2): 20 mg via ORAL
  Filled 2016-12-03 (×2): qty 1

## 2016-12-03 MED ORDER — NICOTINE 14 MG/24HR TD PT24
14.0000 mg | MEDICATED_PATCH | Freq: Every day | TRANSDERMAL | Status: DC
  Administered 2016-12-03 – 2016-12-05 (×3): 14 mg via TRANSDERMAL
  Filled 2016-12-03 (×3): qty 1

## 2016-12-03 MED ORDER — ADULT MULTIVITAMIN W/MINERALS CH
1.0000 | ORAL_TABLET | Freq: Every day | ORAL | Status: DC
  Administered 2016-12-04 – 2016-12-05 (×2): 1 via ORAL
  Filled 2016-12-03 (×2): qty 1

## 2016-12-03 MED ORDER — SODIUM CHLORIDE 0.9 % IV SOLN
INTRAVENOUS | Status: DC
Start: 2016-12-03 — End: 2016-12-04
  Administered 2016-12-03: 100 mL/h via INTRAVENOUS
  Administered 2016-12-03: 13:00:00 via INTRAVENOUS

## 2016-12-03 MED ORDER — MIRTAZAPINE 15 MG PO TABS
15.0000 mg | ORAL_TABLET | Freq: Every day | ORAL | Status: DC
  Administered 2016-12-03 – 2016-12-04 (×2): 15 mg via ORAL
  Filled 2016-12-03 (×2): qty 1

## 2016-12-03 MED ORDER — BENZTROPINE MESYLATE 0.5 MG PO TABS
0.5000 mg | ORAL_TABLET | Freq: Two times a day (BID) | ORAL | Status: DC
  Administered 2016-12-03 – 2016-12-05 (×4): 0.5 mg via ORAL
  Filled 2016-12-03 (×4): qty 1

## 2016-12-03 MED ORDER — IPRATROPIUM BROMIDE 0.02 % IN SOLN: 0.5000 mg | Freq: Four times a day (QID) | RESPIRATORY_TRACT | Status: DC

## 2016-12-03 MED ORDER — SODIUM CHLORIDE 0.9% FLUSH
3.0000 mL | Freq: Two times a day (BID) | INTRAVENOUS | Status: DC
  Administered 2016-12-03 – 2016-12-05 (×5): 3 mL via INTRAVENOUS

## 2016-12-03 NOTE — ED Notes (Signed)
ED Provider at bedside. 

## 2016-12-03 NOTE — ED Notes (Signed)
Notified [facility] of pt admission

## 2016-12-03 NOTE — ED Triage Notes (Signed)
Pt bought in by EMS from group home for fall and syncope x 1 ,

## 2016-12-03 NOTE — ED Provider Notes (Signed)
[facility]-EMERGENCY DEPT Provider Note   CSN: [id number] Arrival date & time: 12/03/16  0802     History   Chief Complaint Chief Complaint  Patient presents with  . Loss of Consciousness    HPI Anthony Solis is a 61 y.o. male.  The history is provided by the patient and the EMS personnel. No language interpreter was used.  Loss of Consciousness     Anthony Solis is a 61 y.o. male who presents to the Emergency Department complaining of syncope.  Level V caveat due to confusion.  Hx is provided by EMS and pt.  Per reports patient had a syncopal event last night and EMS was called to the group home where he resides he was evaluated and plans transfer. He had another event today and on EMS arrival he was noted to have sats of 90% and a temperature of 103 and he agreed to transfer. He reports pain to his low back and states that he passed out at [facility] last night and again this morning. He denies any head injury. He reports some shortness of breath. No abdominal pain, vomiting. He was unaware of fevers. He is a resident of [facility]. Past Medical History:  Diagnosis Date  . Drug abuse   . GERD (gastroesophageal reflux disease)   . Hyperlipidemia   . Hypertension   . IBS (irritable bowel syndrome)   . Polysubstance abuse   . Schizophrenia, schizo-affective type (HCC)   . Thyroid disease     There are no active problems to display for this patient.   History reviewed. No pertinent surgical history.     Home Medications    Prior to Admission medications   Medication Sig Start Date End Date Taking? Authorizing Provider  benzonatate (TESSALON) 200 MG capsule Take 1 capsule (200 mg total) by mouth 3 (three) times daily as needed for cough. 09/26/13   Marisa Severin, MD  cholecalciferol (VITAMIN D) 400 UNITS TABS tablet Take 400 Units by mouth daily.    Historical Provider, MD  cloZAPine (CLOZARIL) 100 MG tablet Take 200 mg by mouth at bedtime.     Historical Provider, MD  clozapine (CLOZARIL) 50 MG tablet Take 50 mg by mouth daily.    Historical Provider, MD  dextromethorphan-guaiFENesin (MUCINEX DM) 30-600 MG per 12 hr tablet Take 1 tablet by mouth 2 (two) times daily. 09/26/13   Marisa Severin, MD  dicyclomine (BENTYL) 20 MG tablet Take 20 mg by mouth every 6 (six) hours.    Historical Provider, MD  Fenofibrate (FENOGLIDE) 120 MG TABS Take 120 mg by mouth daily.    Historical Provider, MD  hydrochlorothiazide (HYDRODIURIL) 50 MG tablet Take 50 mg by mouth daily.    Historical Provider, MD  levothyroxine (SYNTHROID, LEVOTHROID) 50 MCG tablet Take 50 mcg by mouth daily.    Historical Provider, MD  Multiple Vitamin (MULTIVITAMIN) tablet Take 1 tablet by mouth daily.    Historical Provider, MD  polyethylene glycol (MIRALAX / GLYCOLAX) packet Take 17 g by mouth daily as needed. constipation 09/26/13   Marisa Severin, MD  potassium chloride SA (K-DUR,KLOR-CON) 20 MEQ tablet Take 40 mEq by mouth daily.    Historical Provider, MD  predniSONE (DELTASONE) 20 MG tablet Take 3 tablets (60 mg total) by mouth daily. 09/26/13   Marisa Severin, MD  ranitidine (ZANTAC) 150 MG tablet Take 150 mg by mouth daily.    Historical Provider, MD  sennosides-docusate sodium (SENOKOT-S) 8.6-50 MG tablet Take 2 tablets by mouth daily.  09/26/13   Marisa Severin, MD  simvastatin (ZOCOR) 20 MG tablet Take 20 mg by mouth every evening.    Historical Provider, MD    Family History History reviewed. No pertinent family history.  Social History Social History  Substance Use Topics  . Smoking status: Current Every Day Smoker  . Smokeless tobacco: Not on file  . Alcohol use Yes     Allergies   Patient has no known allergies.   Review of Systems Review of Systems  Cardiovascular: Positive for syncope.  All other systems reviewed and are negative.    Physical Exam Updated Vital Signs BP 113/82   Pulse (!) 110   Temp (!) 103 F (39.4 C)   Resp 18   Ht 6' (1.829 m)   Wt 150  lb (68 kg)   SpO2 90%   BMI 20.34 kg/m   Physical Exam  Constitutional: He appears well-developed and well-nourished. He appears distressed.  HENT:  Head: Normocephalic and atraumatic.  Cardiovascular: Regular rhythm.   No murmur heard. Tachycardic  Pulmonary/Chest:  Tachypnea with rhonchi bilaterally  Abdominal: Soft. There is no tenderness. There is no rebound and no guarding.  Musculoskeletal: He exhibits no edema or tenderness.  Neurological: He is alert.  On orientation patient believes the year is 2017. He is mildly confused and slow to answer questions. Generalized weakness.  Skin: Skin is warm and dry.  Psychiatric: He has a normal mood and affect. His behavior is normal.  Nursing note and vitals reviewed.    ED Treatments / Results  Labs (all labs ordered are listed, but only abnormal results are displayed) Labs Reviewed  CULTURE, BLOOD (ROUTINE X 2)  CULTURE, BLOOD (ROUTINE X 2)  URINE CULTURE  CBC  COMPREHENSIVE METABOLIC PANEL  URINALYSIS, ROUTINE W REFLEX MICROSCOPIC  I-STAT CG4 LACTIC ACID, ED    EKG  EKG Interpretation None       Radiology No results found.  Procedures Procedures (including critical care time)  Medications Ordered in ED Medications  dextromethorphan-guaiFENesin (MUCINEX DM) 30-600 MG per 12 hr tablet 1 tablet (1 tablet Oral Given 12/03/16 2344)  benztropine (COGENTIN) tablet 0.5 mg (0.5 mg Oral Given 12/03/16 2345)  cloZAPine (CLOZARIL) tablet 100 mg (not administered)  docusate sodium (COLACE) capsule 100 mg (not administered)  mirtazapine (REMERON) tablet 15 mg (15 mg Oral Given 12/03/16 2345)  cholecalciferol (VITAMIN D) tablet 400 Units (not administered)  fenofibrate tablet 54 mg (not administered)  multivitamin with minerals tablet 1 tablet (not administered)  famotidine (PEPCID) tablet 20 mg (not administered)  senna-docusate (Senokot-S) tablet 2 tablet (2 tablets Oral Given 12/03/16 1609)  cloZAPine (CLOZARIL) tablet  200 mg (200 mg Oral Given 12/03/16 2345)  levothyroxine (SYNTHROID, LEVOTHROID) tablet 50 mcg (not administered)  simvastatin (ZOCOR) tablet 20 mg (20 mg Oral Given 12/03/16 1610)  dicyclomine (BENTYL) tablet 20 mg (20 mg Oral Given 12/04/16 0655)  sodium chloride flush (NS) 0.9 % injection 3 mL (3 mLs Intravenous Given 12/03/16 2346)  0.9 %  sodium chloride infusion (100 mL/hr Intravenous New Bag/Given 12/03/16 2346)  acetaminophen (TYLENOL) tablet 650 mg (not administered)    Or  acetaminophen (TYLENOL) suppository 650 mg (not administered)  ondansetron (ZOFRAN) tablet 4 mg (not administered)    Or  ondansetron (ZOFRAN) injection 4 mg (not administered)  albuterol (PROVENTIL) (2.5 MG/3ML) 0.083% nebulizer solution 2.5 mg (not administered)  oseltamivir (TAMIFLU) capsule 75 mg (0 mg Oral Duplicate 12/03/16 1845)  nicotine (NICODERM CQ - dosed in mg/24 hours) patch 14  mg (14 mg Transdermal Patch Applied 12/03/16 1608)  ipratropium-albuterol (DUONEB) 0.5-2.5 (3) MG/3ML nebulizer solution 3 mL (3 mLs Nebulization Given 12/03/16 1927)  acetaminophen (TYLENOL) tablet 1,000 mg (1,000 mg Oral Given 12/03/16 0822)  sodium chloride 0.9 % bolus 1,000 mL (0 mLs Intravenous Stopped 12/03/16 1113)  ipratropium-albuterol (DUONEB) 0.5-2.5 (3) MG/3ML nebulizer solution 3 mL (3 mLs Nebulization Given 12/03/16 0939)  methylPREDNISolone sodium succinate (SOLU-MEDROL) 125 mg/2 mL injection 125 mg (125 mg Intravenous Given 12/03/16 1014)  ipratropium-albuterol (DUONEB) 0.5-2.5 (3) MG/3ML nebulizer solution 3 mL (3 mLs Nebulization Given 12/03/16 1203)  azithromycin (ZITHROMAX) tablet 500 mg (500 mg Oral Given 12/03/16 1014)  oseltamivir (TAMIFLU) capsule 75 mg (75 mg Oral Given 12/03/16 1120)  sodium chloride 0.9 % bolus 1,000 mL (1,000 mLs Intravenous New Bag/Given 12/03/16 1222)     Initial Impression / Assessment and Plan / ED Course  I have reviewed the triage vital signs and the nursing notes.  Pertinent labs &  imaging results that were available during my care of the patient were reviewed by me and considered in my medical decision making (see chart for details).     Patient here for evaluation of syncope. Patient with tachypnea in the emergency department with confusion. It is unclear what his baseline is. He does have rhonchi on examination with new oxygen requirement and noted to be febrile in the emergency department. Initial concern for pneumonia versus COPD exacerbation versus viral illness. On repeat assessment following nebulizers, steroids, some little oxygen he is feeling improved but does have continued increased oxygen requirement. Influenza swab is positive. Plan to admit for ongoing treatment given his syncope and new oxygen requirement. Presentation is not consistent with ACS or PE. Hospitalist consulted for admission.   Final Clinical Impressions(s) / ED Diagnoses   Final diagnoses:  None    New Prescriptions New Prescriptions   No medications on file     Tilden Fossa, MD 12/04/16 [phone]

## 2016-12-03 NOTE — ED Notes (Signed)
Bed: WA09 Expected date: 12/03/16 Expected time: 8:00 AM Means of arrival: Ambulance Comments: Syncope with fall

## 2016-12-03 NOTE — ED Notes (Signed)
Patient transported to X-ray 

## 2016-12-03 NOTE — H&P (Signed)
History and Physical    Anthony Solis ZOX:[id number] DOB: May 10, 1956 DOA: 12/03/2016  PCP: Default, Provider, MD  Patient coming from: Group Home   I have personally briefly reviewed patient's old medical records in [facility]  Chief Complaint: Pass out, weakness.   HPI: Anthony Solis is a 61 y.o. male with medical history significant of Schizophrenia, HTN, IBS, Polysubstance abuse, who presents today after two episodes of syncope. The first episode was day prior to admission. He was going to store to gets cigarette when he pass out. He felt lightheaded, weak. Today, he was going to store when he pass out again. Same presentation. He denies chest pain, dyspnea. He report productive cough and runny nose. He is feeling generalized weakness, unable to walk.   ED Course: Patient was found to be febrile, with tempeture at 103, oxygen sat 86 on RA. Increased to 100 on 2 L. Chest x ray: Emphysematous change and chronic bronchitic markings. No clear acute cardiopulmonary findings. Influenza B positive, lactic acid 1.6, troponin 0.01, sodium 133, cr 1.27, AST 99, platelet 47, hb 13, WBC 7.8   Review of Systems: As per HPI otherwise 10 point review of systems negative.    Past Medical History:  Diagnosis Date  . Drug abuse   . GERD (gastroesophageal reflux disease)   . Hyperlipidemia   . Hypertension   . IBS (irritable bowel syndrome)   . Polysubstance abuse   . Schizophrenia, schizo-affective type (HCC)   . Thyroid disease     History reviewed. No pertinent surgical history.   reports that he has been smoking.  He does not have any smokeless tobacco history on file. He reports that he drinks alcohol. He reports that he does not use drugs.  No Known Allergies  Family history; [parents alive, he doesn't know his family history   Prior to Admission medications   Medication Sig Start Date End Date Taking? Authorizing Provider  benztropine (COGENTIN) 0.5 MG tablet Take 0.5  mg by mouth 2 (two) times daily.   Yes Historical Provider, MD  cholecalciferol (VITAMIN D) 400 UNITS TABS tablet Take 400 Units by mouth daily.   Yes Historical Provider, MD  cloZAPine (CLOZARIL) 100 MG tablet Take 200 mg by mouth at bedtime.   Yes Historical Provider, MD  cloZAPine (CLOZARIL) 100 MG tablet Take 100 mg by mouth daily.   Yes Historical Provider, MD  dicyclomine (BENTYL) 20 MG tablet Take 20 mg by mouth every 6 (six) hours.   Yes Historical Provider, MD  docusate sodium (COLACE) 100 MG capsule Take 100 mg by mouth daily.   Yes Historical Provider, MD  Fenofibrate (FENOGLIDE) 120 MG TABS Take 120 mg by mouth daily.   Yes Historical Provider, MD  hydrochlorothiazide (HYDRODIURIL) 50 MG tablet Take 50 mg by mouth daily.   Yes Historical Provider, MD  levothyroxine (SYNTHROID, LEVOTHROID) 50 MCG tablet Take 50 mcg by mouth daily.   Yes Historical Provider, MD  mirtazapine (REMERON) 15 MG tablet Take 15 mg by mouth at bedtime.   Yes Historical Provider, MD  Multiple Vitamin (MULTIVITAMIN) tablet Take 1 tablet by mouth daily.   Yes Historical Provider, MD  ranitidine (ZANTAC) 150 MG tablet Take 150 mg by mouth daily.   Yes Historical Provider, MD  sennosides-docusate sodium (SENOKOT-S) 8.6-50 MG tablet Take 2 tablets by mouth daily. 09/26/13  Yes Marisa Severin, MD  simvastatin (ZOCOR) 20 MG tablet Take 20 mg by mouth every evening.   Yes Historical Provider, MD  benzonatate Kimberlee Nearing)  200 MG capsule Take 1 capsule (200 mg total) by mouth 3 (three) times daily as needed for cough. Patient not taking: Reported on 12/03/2016 09/26/13   Marisa Severin, MD  dextromethorphan-guaiFENesin [facility] DM) 30-600 MG per 12 hr tablet Take 1 tablet by mouth 2 (two) times daily. Patient not taking: Reported on 12/03/2016 09/26/13   Marisa Severin, MD  polyethylene glycol [facility] Ethelene Hal) packet Take 17 g by mouth daily as needed. constipation Patient not taking: Reported on 12/03/2016 09/26/13   Marisa Severin, MD    predniSONE (DELTASONE) 20 MG tablet Take 3 tablets (60 mg total) by mouth daily. Patient not taking: Reported on 12/03/2016 09/26/13   Marisa Severin, MD    Physical Exam: Vitals:   12/03/16 0830 12/03/16 0945 12/03/16 1022 12/03/16 1024  BP:   113/77 102/69  Pulse: (!) 112 (!) 106 (!) 102 (!) 107  Resp: (!) 25 (!) 27 (!) 24 (!) 25  Temp:      SpO2: 100% 97% 95% (!) 86%  Weight:      Height:        Constitutional: NAD, calm, comfortable Vitals:   12/03/16 0830 12/03/16 0945 12/03/16 1022 12/03/16 1024  BP:   113/77 102/69  Pulse: (!) 112 (!) 106 (!) 102 (!) 107  Resp: (!) 25 (!) 27 (!) 24 (!) 25  Temp:      SpO2: 100% 97% 95% (!) 86%  Weight:      Height:       Eyes: PERRL, lids and conjunctivae normal ENMT: Mucous membranes are moist. Posterior pharynx clear of any exudate or lesions.Normal dentition.  Neck: normal, supple, no masses, no thyromegaly Respiratory:  Tachypnea,  No accessory muscle use, bilateral air mbvement, bilateral ronchus, no wheezing. .  Cardiovascular: Regular rate and rhythm, no murmurs / rubs / gallops. No extremity edema. 2+ pedal pulses. No carotid bruits.  Abdomen: no tenderness, no masses palpated. No hepatosplenomegaly. Bowel sounds positive.  Musculoskeletal: no clubbing / cyanosis. No joint deformity upper and lower extremities. Good ROM, no contractures. Normal muscle tone.  Skin: no rashes, lesions, ulcers. No induration Neurologic: CN 2-12 grossly intact. Sensation intact, DTR normal. Strength 5/5 in all 4. Slowly answering questions.  Psychiatric:  Normal mood.    Labs on Admission: I have personally reviewed following labs and imaging studies  CBC:  Recent Labs Lab 12/03/16 0825  WBC 7.8  HGB 13.7  HCT 41.2  MCV 87.5  PLT 47*   Basic Metabolic Panel:  Recent Labs Lab 12/03/16 0825  NA 133*  K 4.6  CL 98*  CO2 25  GLUCOSE 116*  BUN 18  CREATININE 1.27*  CALCIUM 8.6*   GFR: Estimated Creatinine Clearance: 61 mL/min (A)  (by C-G formula based on SCr of 1.27 mg/dL (H)). Liver Function Tests:  Recent Labs Lab 12/03/16 0825  AST 99*  ALT 28  ALKPHOS 31*  BILITOT 2.4*  PROT 6.7  ALBUMIN 3.9   No results for input(s): LIPASE, AMYLASE in the last 168 hours. No results for input(s): AMMONIA in the last 168 hours. Coagulation Profile: No results for input(s): INR, PROTIME in the last 168 hours. Cardiac Enzymes: No results for input(s): CKTOTAL, CKMB, CKMBINDEX, TROPONINI in the last 168 hours. BNP (last 3 results) No results for input(s): PROBNP in the last 8760 hours. HbA1C: No results for input(s): HGBA1C in the last 72 hours. CBG: No results for input(s): GLUCAP in the last 168 hours. Lipid Profile: No results for input(s): CHOL, HDL, LDLCALC,  TRIG, CHOLHDL, LDLDIRECT in the last 72 hours. Thyroid Function Tests: No results for input(s): TSH, T4TOTAL, FREET4, T3FREE, THYROIDAB in the last 72 hours. Anemia Panel: No results for input(s): VITAMINB12, FOLATE, FERRITIN, TIBC, IRON, RETICCTPCT in the last 72 hours. Urine analysis:    Component Value Date/Time   COLORURINE AMBER (A) 08/21/2012 0456   APPEARANCEUR CLEAR 08/21/2012 0456   LABSPEC 1.017 08/21/2012 0456   PHURINE 8.5 (H) 08/21/2012 0456   GLUCOSEU NEGATIVE 08/21/2012 0456   HGBUR NEGATIVE 08/21/2012 0456   BILIRUBINUR SMALL (A) 08/21/2012 0456   KETONESUR NEGATIVE 08/21/2012 0456   PROTEINUR NEGATIVE 08/21/2012 0456   UROBILINOGEN 1.0 08/21/2012 0456   NITRITE NEGATIVE 08/21/2012 0456   LEUKOCYTESUR TRACE (A) 08/21/2012 0456    Radiological Exams on Admission: Dg Chest 2 View  Result Date: 12/03/2016 CLINICAL DATA:  Congestion and cough. EXAM: CHEST  2 VIEW COMPARISON:  09/26/2013 FINDINGS: Normal cardiac silhouette. Upper lobe emphysema noted. No focal consolidation. No pulmonary edema. No pleural fluid. No pneumothorax. IMPRESSION: 1. Emphysematous change and chronic bronchitic markings. 2. No clear acute cardiopulmonary  findings. Electronically Signed   By: Genevive Bi M.D.   On: 12/03/2016 08:55   Ct Head Wo Contrast  Result Date: 12/03/2016 CLINICAL DATA:  61 year old male with syncope and fall today. EXAM: CT HEAD WITHOUT CONTRAST TECHNIQUE: Contiguous axial images were obtained from the base of the skull through the vertex without intravenous contrast. COMPARISON:  None. FINDINGS: Brain: No evidence of acute infarction, hemorrhage, hydrocephalus, extra-axial collection or mass lesion/mass effect. Vascular: No hyperdense vessel or unexpected calcification. Skull: Normal. Negative for fracture or focal lesion. Sinuses/Orbits: Small amount of fluid in the left maxillary sinus noted. Other: None. IMPRESSION: No evidence of intracranial abnormality. Small amount of fluid in the left maxillary sinus. Electronically Signed   By: Harmon Pier M.D.   On: 12/03/2016 09:03    EKG: Independently reviewed. Sinus tachycardia, non specific T waves abnormalities.   Assessment/Plan Active Problems:   Influenza B   Hypothyroidism   Schizophrenia (HCC)   Thrombocytopenia (HCC)   Syncope   Acute respiratory failure with hypoxemia (HCC)   Hyponatremia   Orthostatic hypotension  1-Acute hypoxic Respiratory Failure;  In setting of influenza, acute bronchitis.  Admit to telemetry. Oxygen sat stable on @ L.  Schedule nebulizer treatment. Received a dose solumedrol, not currently wheezing.  Influenza  B positive. Started on Tamiflu.   2-Syncope; in setting of hypovolemia, orthostatic hypotension.  Monitor on Telemetry. EKG with non specific T waves abnormalities. Cycle cardiac enzymes.  IV fluids.  Check UDS.   3-Influenza B;  Started on Tamiflu. On droplet precaution.  Nebulizer tx.   4-Chronic Thrombocytopenia. Last platelet count 115 three years ago.  Worsening platelet count could be in setting of infection.  Check B-12 level. Monitor levels.   5-SIRS, early sepsis; in setting influenza.  IV fluids, lactic  acid normal.  Treating for influenza.  Follow Blood culture.   6-Schizophrenia; Continue with Benztropine, clozapine, remeron.   7-Hypothyroidism; continue with synthroid.   DVT prophylaxis: SCD, due to thrombocytopenia.  Code Status: Full code.  Family Communication: care discussed with patient.  Disposition Plan: Back to group home in 48 hours Consults called: None Admission status: Inpatient, telemetry    Hartley Barefoot A MD [facility] Pager [phone]  If 7PM-7AM, please contact night-coverage [email or web address] Password [email or web address]  12/03/2016, 11:59 AM

## 2016-12-04 ENCOUNTER — Inpatient Hospital Stay (HOSPITAL_COMMUNITY): Payer: Medicare Other

## 2016-12-04 DIAGNOSIS — J9601 Acute respiratory failure with hypoxia: Secondary | ICD-10-CM

## 2016-12-04 DIAGNOSIS — E871 Hypo-osmolality and hyponatremia: Secondary | ICD-10-CM

## 2016-12-04 DIAGNOSIS — A419 Sepsis, unspecified organism: Secondary | ICD-10-CM

## 2016-12-04 DIAGNOSIS — R55 Syncope and collapse: Secondary | ICD-10-CM

## 2016-12-04 DIAGNOSIS — E038 Other specified hypothyroidism: Secondary | ICD-10-CM

## 2016-12-04 DIAGNOSIS — J111 Influenza due to unidentified influenza virus with other respiratory manifestations: Secondary | ICD-10-CM

## 2016-12-04 DIAGNOSIS — I951 Orthostatic hypotension: Secondary | ICD-10-CM

## 2016-12-04 LAB — COMPREHENSIVE METABOLIC PANEL
ALBUMIN: 3.3 g/dL — AB (ref 3.5–5.0)
ALT: 33 U/L (ref 17–63)
ANION GAP: 11 (ref 5–15)
AST: 133 U/L — ABNORMAL HIGH (ref 15–41)
Alkaline Phosphatase: 26 U/L — ABNORMAL LOW (ref 38–126)
BUN: 14 mg/dL (ref 6–20)
CALCIUM: 8.4 mg/dL — AB (ref 8.9–10.3)
CHLORIDE: 102 mmol/L (ref 101–111)
CO2: 23 mmol/L (ref 22–32)
Creatinine, Ser: 0.9 mg/dL (ref 0.61–1.24)
GFR calc Af Amer: >60 mL/min (ref >60)
GFR calc non Af Amer: >60 mL/min (ref >60)
GLUCOSE: 114 mg/dL — AB (ref 65–99)
Potassium: 3.7 mmol/L (ref 3.5–5.1)
SODIUM: 136 mmol/L (ref 135–145)
Total Bilirubin: 0.8 mg/dL (ref 0.3–1.2)
Total Protein: 5.9 g/dL — ABNORMAL LOW (ref 6.5–8.1)

## 2016-12-04 LAB — BASIC METABOLIC PANEL
ANION GAP: 9 (ref 5–15)
BUN: 15 mg/dL (ref 6–20)
CALCIUM: 8.3 mg/dL — AB (ref 8.9–10.3)
CO2: 27 mmol/L (ref 22–32)
Chloride: 101 mmol/L (ref 101–111)
Creatinine, Ser: 0.98 mg/dL (ref 0.61–1.24)
Glucose, Bld: 123 mg/dL — ABNORMAL HIGH (ref 65–99)
Potassium: 3.6 mmol/L (ref 3.5–5.1)
SODIUM: 137 mmol/L (ref 135–145)

## 2016-12-04 LAB — CBC
HCT: 34.9 % — ABNORMAL LOW (ref 39.0–52.0)
HEMOGLOBIN: 11.8 g/dL — AB (ref 13.0–17.0)
MCH: 29.2 pg (ref 26.0–34.0)
MCHC: 33.8 g/dL (ref 30.0–36.0)
MCV: 86.4 fL (ref 78.0–100.0)
PLATELETS: 114 10*3/uL — AB (ref 150–400)
RBC: 4.04 MIL/uL — AB (ref 4.22–5.81)
RDW: 13.3 % (ref 11.5–15.5)
WBC: 9.7 10*3/uL (ref 4.0–10.5)

## 2016-12-04 LAB — HIV ANTIBODY (ROUTINE TESTING W REFLEX): HIV SCREEN 4TH GENERATION: NONREACTIVE

## 2016-12-04 LAB — TROPONIN I

## 2016-12-04 LAB — URINE CULTURE: Culture: NO GROWTH

## 2016-12-04 MED ORDER — SODIUM CHLORIDE 0.9 % IV SOLN
INTRAVENOUS | Status: AC
  Administered 2016-12-04: 11:00:00 via INTRAVENOUS
  Filled 2016-12-04: qty 1000

## 2016-12-04 NOTE — Progress Notes (Addendum)
PROGRESS NOTE  Anthony Solis WUJ:[id number] DOB: 1955/10/14 DOA: 12/03/2016 PCP: Default, Provider, MD  Brief History:  61 year old male with a history of schizoaffective disorder, hypertension, polysubstance abuse, hyperlipidemia presented with syncopal episodes 2. On 12/02/2016, the patient had a syncopal episode while walking back to his group home from the [facility]. He stated that he felt lightheaded and weak which resulted in another fall. On the morning of 12/04/2015, the patient had another syncopal episode at his group home. EMS was activated. The patient was found to have a temperature 103.80F with oxygen saturation of 90%. He agreed to be transferred to the emergency department for further evaluation. The patient denied any headache, chest pain, nausea, vomiting, diarrhea. He did have some shortness of breath with coughing producing yellow-green sputum the past to 2- 3 days. Initial workup revealed positive influenza B with chest x-ray showing chronic bronchitic changes. Orthostatic vital signs were positive. The patient was started on oseltamivir and IV fluids.  Assessment/Plan: Acute respiratory failure with hypoxia -Secondary to influenza in the setting of chronic tobacco use -The patient has been weaned to room air saturation 94-95% -Flutter valve  Sepsis  -secondary to influenza -presented with tachycardia, fever, and hypoxia -sepsis physiology improving -Lactic acid 1.67 -continue IVF -follow blood cultures -UA--no pyuria -CXR--no consolidation--personally reviewed  Influenza with respiratory manifestations -Continue oseltamivir -Presently stable on room air  Syncope -Secondary to volume depletion/orthostasis -Continue IV fluids -Recheck orthostatic vital signs -personally reviewed EKG--sinus, nonspecific T-wave changes  Thrombocytopenia -This has been chronic dating back to 09/26/2013 -Serum B12 790 -HIV pending -am  CBC  Transaminasemia/Hyperbilirubinemia -RUQ [place] -hep B and C serology -HIV pending -likely due to medications  Hyponatremia -Secondary to volume depletion -Improved with IV fluids -am BMP  Schizoaffective disorder -Continue Cogentin and Clozaril  Disposition Plan:   Home 4/17 if stable Family Communication:   No Family at bedside  Consultants:  none  Code Status:  FULL   DVT Prophylaxis:  SCDs   Procedures: As Listed in Progress Note Above  Antibiotics: None    Subjective: Patient continues complaining of cough with yellow sputum. Denies any hemoptysis. He denies any fevers, chills, chest pain, vomiting, diarrhea. Breathing is better. His shortness of breath is better  Objective: Vitals:   12/03/16 1927 12/03/16 2118 12/04/16 0440 12/04/16 0827  BP:  94/63 102/61   Pulse:  91 95   Resp:  18 18   Temp:  98.1 F (36.7 C) 98.1 F (36.7 C)   TempSrc:  Oral Oral   SpO2: 92% 100% 94% 93%  Weight:      Height:        Intake/Output Summary (Last 24 hours) at 12/04/16 0858 Last data filed at 12/04/16 0600  Gross per 24 hour  Intake          2058.33 ml  Output             2050 ml  Net             8.33 ml   Weight change:  Exam:   General:  Pt is alert, follows commands appropriately, not in acute distress  HEENT: No icterus, No thrush, No neck mass, [place]/AT  Cardiovascular: RRR, S1/S2, no rubs, no gallops  Respiratory: Bibasilar rales. Diminished breath sounds bilateral.  Abdomen: Soft/+BS, non tender, non distended, no guarding  Extremities: No edema, No lymphangitis, No petechiae, No rashes, no synovitis   Data Reviewed: I have personally  reviewed following labs and imaging studies Basic Metabolic Panel:  Recent Labs Lab 12/03/16 0825 12/04/16 0544  NA 133* 137  K 4.6 3.6  CL 98* 101  CO2 25 27  GLUCOSE 116* 123*  BUN 18 15  CREATININE 1.27* 0.98  CALCIUM 8.6* 8.3*   Liver Function Tests:  Recent Labs Lab 12/03/16 0825  AST 99*   ALT 28  ALKPHOS 31*  BILITOT 2.4*  PROT 6.7  ALBUMIN 3.9   No results for input(s): LIPASE, AMYLASE in the last 168 hours. No results for input(s): AMMONIA in the last 168 hours. Coagulation Profile: No results for input(s): INR, PROTIME in the last 168 hours. CBC:  Recent Labs Lab 12/03/16 0825 12/04/16 0544  WBC 7.8 9.7  NEUTROABS 5.7  --   HGB 13.7 11.8*  HCT 41.2 34.9*  MCV 87.5 86.4  PLT 47* 114*   Cardiac Enzymes:  Recent Labs Lab 12/03/16 1642 12/03/16 2241 12/04/16 0544  TROPONINI <0.03 <0.03 <0.03   BNP: Invalid input(s): POCBNP CBG: No results for input(s): GLUCAP in the last 168 hours. HbA1C: No results for input(s): HGBA1C in the last 72 hours. Urine analysis:    Component Value Date/Time   COLORURINE YELLOW 12/03/2016 1327   APPEARANCEUR CLEAR 12/03/2016 1327   LABSPEC 1.011 12/03/2016 1327   PHURINE 6.0 12/03/2016 1327   GLUCOSEU NEGATIVE 12/03/2016 1327   HGBUR SMALL (A) 12/03/2016 1327   BILIRUBINUR NEGATIVE 12/03/2016 1327   KETONESUR NEGATIVE 12/03/2016 1327   PROTEINUR NEGATIVE 12/03/2016 1327   UROBILINOGEN 1.0 08/21/2012 0456   NITRITE NEGATIVE 12/03/2016 1327   LEUKOCYTESUR NEGATIVE 12/03/2016 1327   Sepsis Labs: (procalcitonin:4,lacticidven:4) ) Recent Results (from the past 240 hour(s))  MRSA PCR Screening     Status: None   Collection Time: 12/03/16  1:28 PM  Result Value Ref Range Status   MRSA by PCR NEGATIVE NEGATIVE Final    Comment:        The GeneXpert MRSA Assay (FDA approved for NASAL specimens only), is one component of a comprehensive MRSA colonization surveillance program. It is not intended to diagnose MRSA infection nor to guide or monitor treatment for MRSA infections.      Scheduled Meds: . benztropine  0.5 mg Oral BID  . cholecalciferol  400 Units Oral Daily  . cloZAPine  100 mg Oral Daily  . cloZAPine  200 mg Oral QHS  . dextromethorphan-guaiFENesin  1 tablet Oral BID  . dicyclomine   20 mg Oral Q6H  . docusate sodium  100 mg Oral Daily  . famotidine  20 mg Oral Daily  . fenofibrate  54 mg Oral Daily  . ipratropium-albuterol  3 mL Nebulization TID  . levothyroxine  50 mcg Oral QAC breakfast  . mirtazapine  15 mg Oral QHS  . multivitamin with minerals  1 tablet Oral Daily  . nicotine  14 mg Transdermal Daily  . oseltamivir  75 mg Oral BID  . senna-docusate  2 tablet Oral Daily  . simvastatin  20 mg Oral QPM  . sodium chloride flush  3 mL Intravenous Q12H   Continuous Infusions: . sodium chloride 100 mL/hr (12/03/16 2346)    Procedures/Studies: Dg Chest 2 View  Result Date: 12/03/2016 CLINICAL DATA:  Congestion and cough. EXAM: CHEST  2 VIEW COMPARISON:  09/26/2013 FINDINGS: Normal cardiac silhouette. Upper lobe emphysema noted. No focal consolidation. No pulmonary edema. No pleural fluid. No pneumothorax. IMPRESSION: 1. Emphysematous change and chronic bronchitic markings. 2. No clear acute cardiopulmonary findings. Electronically Signed  By: Genevive Bi M.D.   On: 12/03/2016 08:55   Ct Head Wo Contrast  Result Date: 12/03/2016 CLINICAL DATA:  61 year old male with syncope and fall today. EXAM: CT HEAD WITHOUT CONTRAST TECHNIQUE: Contiguous axial images were obtained from the base of the skull through the vertex without intravenous contrast. COMPARISON:  None. FINDINGS: Brain: No evidence of acute infarction, hemorrhage, hydrocephalus, extra-axial collection or mass lesion/mass effect. Vascular: No hyperdense vessel or unexpected calcification. Skull: Normal. Negative for fracture or focal lesion. Sinuses/Orbits: Small amount of fluid in the left maxillary sinus noted. Other: None. IMPRESSION: No evidence of intracranial abnormality. Small amount of fluid in the left maxillary sinus. Electronically Signed   By: Harmon Pier M.D.   On: 12/03/2016 09:03    Prentiss Hammett, DO  [facility] Pager [phone]  If 7PM-7AM, please contact  night-coverage [email or web address] Password [email or web address] 12/04/2016, 8:58 AM   LOS: 1 day

## 2016-12-04 NOTE — Care Management Note (Signed)
Case Management Note  Patient Details  Name: Anthony Solis MRN: [id number] Date of Birth: 02-04-1956  Subjective/Objective: 61 y/o m admitted w/influenza. From group home-CSW following.                   Action/Plan:d/c back to group home.   Expected Discharge Date:                  Expected Discharge Plan:  Group Home  In-House Referral:  Clinical Social Work  Discharge planning Services  CM Consult  Post Acute Care Choice:    Choice offered to:     DME Arranged:    DME Agency:     HH Arranged:    HH Agency:     Status of Service:  In process, will continue to follow  If discussed at Long Length of Stay Meetings, dates discussed:    Additional Comments:  Lanier Clam, RN 12/04/2016, 12:01 PM

## 2016-12-05 DIAGNOSIS — J101 Influenza due to other identified influenza virus with other respiratory manifestations: Secondary | ICD-10-CM

## 2016-12-05 LAB — BASIC METABOLIC PANEL
Anion gap: 8 (ref 5–15)
BUN: 11 mg/dL (ref 6–20)
CALCIUM: 8.2 mg/dL — AB (ref 8.9–10.3)
CHLORIDE: 106 mmol/L (ref 101–111)
CO2: 24 mmol/L (ref 22–32)
Creatinine, Ser: 0.82 mg/dL (ref 0.61–1.24)
Glucose, Bld: 92 mg/dL (ref 65–99)
Potassium: 3.4 mmol/L — ABNORMAL LOW (ref 3.5–5.1)
Sodium: 138 mmol/L (ref 135–145)

## 2016-12-05 LAB — MAGNESIUM: MAGNESIUM: 2 mg/dL (ref 1.7–2.4)

## 2016-12-05 LAB — CBC
HCT: 32.6 % — ABNORMAL LOW (ref 39.0–52.0)
Hemoglobin: 11.3 g/dL — ABNORMAL LOW (ref 13.0–17.0)
MCH: 30.8 pg (ref 26.0–34.0)
MCHC: 34.7 g/dL (ref 30.0–36.0)
MCV: 88.8 fL (ref 78.0–100.0)
PLATELETS: 122 10*3/uL — AB (ref 150–400)
RBC: 3.67 MIL/uL — ABNORMAL LOW (ref 4.22–5.81)
RDW: 13.6 % (ref 11.5–15.5)
WBC: 6.2 10*3/uL (ref 4.0–10.5)

## 2016-12-05 LAB — HEPATITIS C ANTIBODY: HCV Ab: >11 s/co ratio — ABNORMAL HIGH (ref 0.0–0.9)

## 2016-12-05 LAB — HEPATITIS B SURFACE ANTIGEN: HEP B S AG: NEGATIVE

## 2016-12-05 MED ORDER — OSELTAMIVIR PHOSPHATE 75 MG PO CAPS: 75.0000 mg | ORAL_CAPSULE | Freq: Two times a day (BID) | ORAL | 0 refills | Status: DC

## 2016-12-05 NOTE — Care Management Note (Signed)
Case Management Note  Patient Details  Name: Anthony Solis MRN: [id number] Date of Birth: 1955/08/26  Subjective/Objective: CSW following for d/c back to group home.                   Action/Plan:d/c group home.   Expected Discharge Date:  12/05/16               Expected Discharge Plan:  Group Home  In-House Referral:  Clinical Social Work  Discharge planning Services  CM Consult  Post Acute Care Choice:    Choice offered to:     DME Arranged:    DME Agency:     HH Arranged:    HH Agency:     Status of Service:  Completed, signed off  If discussed at [employer], dates discussed:    Additional Comments:  Lanier Clam, RN 12/05/2016, 11:11 AM

## 2016-12-05 NOTE — Discharge Summary (Signed)
Physician Discharge Summary  Eleazar Kimmey RUE:[id number] DOB: 1956/01/03 DOA: 12/03/2016  PCP: Default, Provider, MD  Admit date: 12/03/2016 Discharge date: 12/05/2016  Admitted From: Home Disposition:  Home   Recommendations for Outpatient Follow-up:  1. Follow up with PCP in 1-2 weeks 2. Please obtain BMP/CBC in one week   Discharge Condition: Stable CODE STATUS: FULL Diet recommendation: Heart Healthy  Brief/Interim Summary: 61 year old male with a history of schizoaffective disorder, hypertension, polysubstance abuse, hyperlipidemia presented with syncopal episodes 2. On 12/02/2016, the patient had a syncopal episode while walking back to his group home from the [facility]. He stated that he felt lightheaded and weak which resulted in another fall. On the morning of 12/04/2015, the patient had another syncopal episode at his group home. EMS was activated. The patient was found to have a temperature 103.36F with oxygen saturation of 90%. He agreed to be transferred to the emergency department for further evaluation. The patient denied any headache, chest pain, nausea, vomiting, diarrhea. He did have some shortness of breath with coughing producing yellow-green sputum the past to 2- 3 days. Initial workup revealed positive influenza B with chest x-ray showing chronic bronchitic changes. Orthostatic vital signs were positive. The patient was started on oseltamivir and IV fluids.  Discharge Diagnoses:  Acute respiratory failure with hypoxia -Secondary to influenza in the setting of chronic tobacco use -The patient has been weaned to room air saturation 94-95% -Flutter valve -ambulatory pulseox prior to discharge did not show any desaturation on RA  Sepsis  -secondary to influenza -presented with tachycardia, fever, and hypoxia -sepsis physiology improving -Lactic acid 1.67 -continue IVF -follow blood cultures--neg at time of d/c -UA--no pyuria -CXR--no  consolidation--personally reviewed  Influenza with respiratory manifestations -Continue oseltamivir--3 more days after d/c -Presently stable on room air  Syncope -Secondary to volume depletion/orthostasis -Continue IV fluids -Recheck orthostatic vital signs on day of d/c-->neg -personally reviewed EKG--sinus, nonspecific T-wave changes  Thrombocytopenia -This has been chronic dating back to 09/26/2013 -Serum B12 790 -HIV--neg -acute worsening due to infection-->improving  Transaminasemia/Hyperbilirubinemia -RUQ US--neg -hep B surface antigen negative -hep C antibody positive-->outpt followup -HIV--neg -likely due to medications/infection-->improving  Hyponatremia -Secondary to volume depletion -Improved with IV fluids -d/c HCTZ altogether--BP remained controlled  Schizoaffective disorder -Continue Cogentin and Clozaril   Discharge Instructions  Discharge Instructions    Diet - low sodium heart healthy    Complete by:  As directed    Increase activity slowly    Complete by:  As directed      Allergies as of 12/05/2016   No Known Allergies     Medication List    STOP taking these medications   benzonatate 200 MG capsule Commonly known as:  TESSALON   dextromethorphan-guaiFENesin 30-600 MG 12hr tablet Commonly known as:  MUCINEX DM   hydrochlorothiazide 50 MG tablet Commonly known as:  HYDRODIURIL   polyethylene glycol packet Commonly known as:  MIRALAX / GLYCOLAX   predniSONE 20 MG tablet Commonly known as:  DELTASONE     TAKE these medications   benztropine 0.5 MG tablet Commonly known as:  COGENTIN Take 0.5 mg by mouth 2 (two) times daily.   cholecalciferol 400 units Tabs tablet Commonly known as:  VITAMIN D Take 400 Units by mouth daily.   cloZAPine 100 MG tablet Commonly known as:  CLOZARIL Take 100 mg by mouth daily.   cloZAPine 100 MG tablet Commonly known as:  CLOZARIL Take 200 mg by mouth at bedtime.   dicyclomine 20 MG  tablet Commonly known as:  BENTYL Take 20 mg by mouth every 6 (six) hours.   docusate sodium 100 MG capsule Commonly known as:  COLACE Take 100 mg by mouth daily.   FENOGLIDE 120 MG Tabs Generic drug:  Fenofibrate Take 120 mg by mouth daily.   levothyroxine 50 MCG tablet Commonly known as:  SYNTHROID, LEVOTHROID Take 50 mcg by mouth daily.   mirtazapine 15 MG tablet Commonly known as:  REMERON Take 15 mg by mouth at bedtime.   multivitamin tablet Take 1 tablet by mouth daily.   oseltamivir 75 MG capsule Commonly known as:  TAMIFLU Take 1 capsule (75 mg total) by mouth 2 (two) times daily.   ranitidine 150 MG tablet Commonly known as:  ZANTAC Take 150 mg by mouth daily.   sennosides-docusate sodium 8.6-50 MG tablet Commonly known as:  SENOKOT-S Take 2 tablets by mouth daily.   simvastatin 20 MG tablet Commonly known as:  ZOCOR Take 20 mg by mouth every evening.       No Known Allergies  Consultations:  none   Procedures/Studies: Dg Chest 2 View  Result Date: 12/03/2016 CLINICAL DATA:  Congestion and cough. EXAM: CHEST  2 VIEW COMPARISON:  09/26/2013 FINDINGS: Normal cardiac silhouette. Upper lobe emphysema noted. No focal consolidation. No pulmonary edema. No pleural fluid. No pneumothorax. IMPRESSION: 1. Emphysematous change and chronic bronchitic markings. 2. No clear acute cardiopulmonary findings. Electronically Signed   By: Genevive Bi M.D.   On: 12/03/2016 08:55   Ct Head Wo Contrast  Result Date: 12/03/2016 CLINICAL DATA:  61 year old male with syncope and fall today. EXAM: CT HEAD WITHOUT CONTRAST TECHNIQUE: Contiguous axial images were obtained from the base of the skull through the vertex without intravenous contrast. COMPARISON:  None. FINDINGS: Brain: No evidence of acute infarction, hemorrhage, hydrocephalus, extra-axial collection or mass lesion/mass effect. Vascular: No hyperdense vessel or unexpected calcification. Skull: Normal. Negative  for fracture or focal lesion. Sinuses/Orbits: Small amount of fluid in the left maxillary sinus noted. Other: None. IMPRESSION: No evidence of intracranial abnormality. Small amount of fluid in the left maxillary sinus. Electronically Signed   By: Harmon Pier M.D.   On: 12/03/2016 09:03   US Abdomen Limited Ruq  Result Date: 12/04/2016 CLINICAL DATA:  Hyperbilirubinemia. EXAM: US ABDOMEN LIMITED - RIGHT UPPER QUADRANT COMPARISON:  None. FINDINGS: Gallbladder: No gallstones or wall thickening visualized. No sonographic Murphy sign noted by sonographer. Common bile duct: Diameter: 3.8 mm, normal. Liver: No focal lesion identified. Within normal limits in parenchymal echogenicity. IMPRESSION: Normal exam. Electronically Signed   By: Francene Boyers M.D.   On: 12/04/2016 17:15        Discharge Exam: Vitals:   12/04/16 2037 12/05/16 0554  BP: 91/67 114/71  Pulse: 96 90  Resp: 18 18  Temp: 98.6 F (37 C) 98.7 F (37.1 C)   Vitals:   12/04/16 1413 12/04/16 1418 12/04/16 2037 12/05/16 0554  BP: 102/60  91/67 114/71  Pulse: 99  96 90  Resp: Temp: 98.2 F (36.8 C)  98.6 F (37 C) 98.7 F (37.1 C)  TempSrc: Oral  Oral Oral  SpO2: 94% 94% 92% 98%  Weight:      Height:        General: Pt is alert, awake, not in acute distress Cardiovascular: RRR, S1/S2 +, no rubs, no gallops Respiratory: diminished BS with bibasilar rales; no wheeze Abdominal: Soft, NT, ND, bowel sounds + Extremities: no edema, no cyanosis  The results of significant diagnostics from this hospitalization (including imaging, microbiology, ancillary and laboratory) are listed below for reference.    Significant Diagnostic Studies: Dg Chest 2 View  Result Date: 12/03/2016 CLINICAL DATA:  Congestion and cough. EXAM: CHEST  2 VIEW COMPARISON:  09/26/2013 FINDINGS: Normal cardiac silhouette. Upper lobe emphysema noted. No focal consolidation. No pulmonary edema. No pleural fluid. No pneumothorax. IMPRESSION:  1. Emphysematous change and chronic bronchitic markings. 2. No clear acute cardiopulmonary findings. Electronically Signed   By: Genevive Bi M.D.   On: 12/03/2016 08:55   Ct Head Wo Contrast  Result Date: 12/03/2016 CLINICAL DATA:  61 year old male with syncope and fall today. EXAM: CT HEAD WITHOUT CONTRAST TECHNIQUE: Contiguous axial images were obtained from the base of the skull through the vertex without intravenous contrast. COMPARISON:  None. FINDINGS: Brain: No evidence of acute infarction, hemorrhage, hydrocephalus, extra-axial collection or mass lesion/mass effect. Vascular: No hyperdense vessel or unexpected calcification. Skull: Normal. Negative for fracture or focal lesion. Sinuses/Orbits: Small amount of fluid in the left maxillary sinus noted. Other: None. IMPRESSION: No evidence of intracranial abnormality. Small amount of fluid in the left maxillary sinus. Electronically Signed   By: Harmon Pier M.D.   On: 12/03/2016 09:03   US Abdomen Limited Ruq  Result Date: 12/04/2016 CLINICAL DATA:  Hyperbilirubinemia. EXAM: US ABDOMEN LIMITED - RIGHT UPPER QUADRANT COMPARISON:  None. FINDINGS: Gallbladder: No gallstones or wall thickening visualized. No sonographic Murphy sign noted by sonographer. Common bile duct: Diameter: 3.8 mm, normal. Liver: No focal lesion identified. Within normal limits in parenchymal echogenicity. IMPRESSION: Normal exam. Electronically Signed   By: Francene Boyers M.D.   On: 12/04/2016 17:15     Microbiology: Recent Results (from the past 240 hour(s))  Blood culture (routine x 2)     Status: None (Preliminary result)   Collection Time: 12/03/16  8:30 AM  Result Value Ref Range Status   Specimen Description BLOOD RIGHT ARM  Final   Special Requests   Final    BOTTLES DRAWN AEROBIC AND ANAEROBIC Blood Culture adequate volume   Culture   Final    NO GROWTH 2 DAYS Performed at [facility], [address], [place], [place] [address]    Report Status  PENDING  Incomplete  Urine culture     Status: None   Collection Time: 12/03/16  1:28 PM  Result Value Ref Range Status   Specimen Description URINE, RANDOM  Final   Special Requests NONE  Final   Culture   Final    NO GROWTH Performed at [facility], [address] [place], [place] [address]    Report Status 12/04/2016 FINAL  Final  MRSA PCR Screening     Status: None   Collection Time: 12/03/16  1:28 PM  Result Value Ref Range Status   MRSA by PCR NEGATIVE NEGATIVE Final    Comment:        The GeneXpert MRSA Assay (FDA approved for NASAL specimens only), is one component of a comprehensive MRSA colonization surveillance program. It is not intended to diagnose MRSA infection nor to guide or monitor treatment for MRSA infections.   Blood culture (routine x 2)     Status: None (Preliminary result)   Collection Time: 12/03/16  4:40 PM  Result Value Ref Range Status   Specimen Description BLOOD LEFT ARM  Final   Special Requests   Final    BOTTLES DRAWN AEROBIC ONLY Blood Culture adequate volume  Culture   Final    NO GROWTH 2 DAYS Performed at [facility], [address] [place], [place] [address]    Report Status PENDING  Incomplete     Labs: Basic Metabolic Panel:  Recent Labs Lab 12/03/16 0825 12/04/16 0544 12/04/16 0919 12/05/16 0535  NA 133* 137 136 138  K 4.6 3.6 3.7 3.4*  CL 98* 101 102 106  CO2 GLUCOSE 116* 123* 114* 92  BUN CREATININE 1.27* 0.98 0.90 0.82  CALCIUM 8.6* 8.3* 8.4* 8.2*  MG  --   --   --  2.0   Liver Function Tests:  Recent Labs Lab 12/03/16 0825 12/04/16 0919  AST 99* 133*  ALT 28 33  ALKPHOS 31* 26*  BILITOT 2.4* 0.8  PROT 6.7 5.9*  ALBUMIN 3.9 3.3*   No results for input(s): LIPASE, AMYLASE in the last 168 hours. No results for input(s): AMMONIA in the last 168 hours. CBC:  Recent Labs Lab 12/03/16 0825 12/04/16 0544 12/05/16 0535  WBC 7.8 9.7 6.2  NEUTROABS 5.7  --   --    HGB 13.7 11.8* 11.3*  HCT 41.2 34.9* 32.6*  MCV 87.5 86.4 88.8  PLT 47* 114* 122*   Cardiac Enzymes:  Recent Labs Lab 12/03/16 1642 12/03/16 2241 12/04/16 0544  TROPONINI <0.03 <0.03 <0.03   BNP: Invalid input(s): POCBNP CBG: No results for input(s): GLUCAP in the last 168 hours.  Time coordinating discharge:  Greater than 30 minutes  Signed:  Rael Tilly, DO [facility] Pager: [phone] 12/05/2016, 10:58 AM
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
## 2016-12-05 NOTE — Progress Notes (Signed)
AVS reviewed with patient, patient able to verbalize when all medications are next due. Anthony Solis A

## 2016-12-09 LAB — CULTURE, BLOOD (ROUTINE X 2)
CULTURE: NO GROWTH
Culture: NO GROWTH
SPECIAL REQUESTS: ADEQUATE
Special Requests: ADEQUATE

## 2019-03-11 ENCOUNTER — Emergency Department (HOSPITAL_COMMUNITY): Payer: Medicare Other

## 2019-03-11 ENCOUNTER — Other Ambulatory Visit: Payer: Self-pay

## 2019-03-11 ENCOUNTER — Inpatient Hospital Stay (HOSPITAL_COMMUNITY)
Admission: EM | Admit: 2019-03-11 | Discharge: 2019-03-14 | DRG: 072 | Disposition: A | Discharge status: Home / Self Care | Payer: Medicare Other | Source: Skilled Nursing Facility | Attending: Internal Medicine | Admitting: Internal Medicine

## 2019-03-11 ENCOUNTER — Encounter (HOSPITAL_COMMUNITY): Payer: Self-pay | Admitting: *Deleted

## 2019-03-11 DIAGNOSIS — R740 Nonspecific elevation of levels of transaminase and lactic acid dehydrogenase [LDH]: Secondary | ICD-10-CM | POA: Diagnosis not present

## 2019-03-11 DIAGNOSIS — D72829 Elevated white blood cell count, unspecified: Secondary | ICD-10-CM | POA: Diagnosis present

## 2019-03-11 DIAGNOSIS — Z79890 Hormone replacement therapy (postmenopausal): Secondary | ICD-10-CM

## 2019-03-11 DIAGNOSIS — I10 Essential (primary) hypertension: Secondary | ICD-10-CM | POA: Diagnosis present

## 2019-03-11 DIAGNOSIS — G9340 Encephalopathy, unspecified: Secondary | ICD-10-CM | POA: Diagnosis not present

## 2019-03-11 DIAGNOSIS — K219 Gastro-esophageal reflux disease without esophagitis: Secondary | ICD-10-CM | POA: Diagnosis present

## 2019-03-11 DIAGNOSIS — Z8619 Personal history of other infectious and parasitic diseases: Secondary | ICD-10-CM

## 2019-03-11 DIAGNOSIS — F17200 Nicotine dependence, unspecified, uncomplicated: Secondary | ICD-10-CM | POA: Diagnosis present

## 2019-03-11 DIAGNOSIS — R32 Unspecified urinary incontinence: Secondary | ICD-10-CM | POA: Diagnosis present

## 2019-03-11 DIAGNOSIS — E039 Hypothyroidism, unspecified: Secondary | ICD-10-CM | POA: Diagnosis present

## 2019-03-11 DIAGNOSIS — Z20828 Contact with and (suspected) exposure to other viral communicable diseases: Secondary | ICD-10-CM | POA: Diagnosis present

## 2019-03-11 DIAGNOSIS — K589 Irritable bowel syndrome without diarrhea: Secondary | ICD-10-CM | POA: Diagnosis present

## 2019-03-11 DIAGNOSIS — Z79899 Other long term (current) drug therapy: Secondary | ICD-10-CM

## 2019-03-11 DIAGNOSIS — E785 Hyperlipidemia, unspecified: Secondary | ICD-10-CM | POA: Diagnosis present

## 2019-03-11 DIAGNOSIS — E876 Hypokalemia: Secondary | ICD-10-CM | POA: Diagnosis present

## 2019-03-11 DIAGNOSIS — R748 Abnormal levels of other serum enzymes: Secondary | ICD-10-CM | POA: Diagnosis present

## 2019-03-11 DIAGNOSIS — F209 Schizophrenia, unspecified: Secondary | ICD-10-CM | POA: Diagnosis present

## 2019-03-11 DIAGNOSIS — R4781 Slurred speech: Secondary | ICD-10-CM | POA: Diagnosis present

## 2019-03-11 DIAGNOSIS — R7401 Elevation of levels of liver transaminase levels: Secondary | ICD-10-CM

## 2019-03-11 DIAGNOSIS — H5711 Ocular pain, right eye: Secondary | ICD-10-CM | POA: Diagnosis present

## 2019-03-11 LAB — COMPREHENSIVE METABOLIC PANEL
ALT: 111 U/L — ABNORMAL HIGH (ref 0–44)
AST: 132 U/L — ABNORMAL HIGH (ref 15–41)
Albumin: 3.9 g/dL (ref 3.5–5.0)
Alkaline Phosphatase: 33 U/L — ABNORMAL LOW (ref 38–126)
Anion gap: 12 (ref 5–15)
BUN: 14 mg/dL (ref 6–20)
CO2: 26 mmol/L (ref 22–32)
Calcium: 9.3 mg/dL (ref 8.9–10.3)
Chloride: 104 mmol/L (ref 98–111)
Creatinine, Ser: 0.92 mg/dL (ref 0.61–1.24)
GFR calc Af Amer: >60 mL/min (ref >60)
GFR calc non Af Amer: >60 mL/min (ref >60)
Glucose, Bld: 97 mg/dL (ref 70–99)
Potassium: 3.1 mmol/L — ABNORMAL LOW (ref 3.5–5.1)
Sodium: 142 mmol/L (ref 135–145)
Total Bilirubin: 1.2 mg/dL (ref 0.3–1.2)
Total Protein: 6.6 g/dL (ref 6.5–8.1)

## 2019-03-11 LAB — CBC
HCT: 39.2 % (ref 39.0–52.0)
Hemoglobin: 13 g/dL (ref 13.0–17.0)
MCH: 31.8 pg (ref 26.0–34.0)
MCHC: 33.2 g/dL (ref 30.0–36.0)
MCV: 95.8 fL (ref 80.0–100.0)
Platelets: 171 10*3/uL (ref 150–400)
RBC: 4.09 MIL/uL — ABNORMAL LOW (ref 4.22–5.81)
RDW: 13 % (ref 11.5–15.5)
WBC: 15.4 10*3/uL — ABNORMAL HIGH (ref 4.0–10.5)
nRBC: 0 % (ref 0.0–0.2)

## 2019-03-11 LAB — URINALYSIS, ROUTINE W REFLEX MICROSCOPIC
Bilirubin Urine: NEGATIVE
Glucose, UA: NEGATIVE mg/dL
Hgb urine dipstick: NEGATIVE
Ketones, ur: NEGATIVE mg/dL
Leukocytes,Ua: NEGATIVE
Nitrite: NEGATIVE
Protein, ur: NEGATIVE mg/dL
Specific Gravity, Urine: 1.009 (ref 1.005–1.030)
pH: 6 (ref 5.0–8.0)

## 2019-03-11 LAB — RAPID URINE DRUG SCREEN, HOSP PERFORMED
Amphetamines: NOT DETECTED
Barbiturates: NOT DETECTED
Benzodiazepines: NOT DETECTED
Cocaine: NOT DETECTED
Opiates: NOT DETECTED
Tetrahydrocannabinol: NOT DETECTED

## 2019-03-11 LAB — SARS CORONAVIRUS 2 BY RT PCR (HOSPITAL ORDER, PERFORMED IN ~~LOC~~ HOSPITAL LAB): SARS Coronavirus 2: NEGATIVE

## 2019-03-11 LAB — MAGNESIUM: Magnesium: 2 mg/dL (ref 1.7–2.4)

## 2019-03-11 LAB — AMMONIA: Ammonia: 11 umol/L (ref 9–35)

## 2019-03-11 LAB — CBG MONITORING, ED: Glucose-Capillary: 92 mg/dL (ref 70–99)

## 2019-03-11 LAB — ETHANOL: Alcohol, Ethyl (B): <10 mg/dL (ref <10)

## 2019-03-11 LAB — SALICYLATE LEVEL: Salicylate Lvl: <7 mg/dL (ref 2.8–30.0)

## 2019-03-11 LAB — BRAIN NATRIURETIC PEPTIDE: B Natriuretic Peptide: 52.4 pg/mL (ref 0.0–100.0)

## 2019-03-11 LAB — TROPONIN I (HIGH SENSITIVITY)
Troponin I (High Sensitivity): 6 ng/L (ref <18)
Troponin I (High Sensitivity): 5 ng/L (ref <18)

## 2019-03-11 LAB — LACTIC ACID, PLASMA
Lactic Acid, Venous: 2.4 mmol/L (ref 0.5–1.9)
Lactic Acid, Venous: 1.5 mmol/L (ref 0.5–1.9)

## 2019-03-11 LAB — ACETAMINOPHEN LEVEL: Acetaminophen (Tylenol), Serum: <10 ug/mL — ABNORMAL LOW (ref 10–30)

## 2019-03-11 MED ORDER — POTASSIUM CHLORIDE 10 MEQ/100ML IV SOLN: 10.0000 meq | INTRAVENOUS | Status: AC

## 2019-03-11 MED ORDER — SODIUM CHLORIDE 0.9% FLUSH
3.0000 mL | Freq: Once | INTRAVENOUS | Status: AC
  Administered 2019-03-11: 3 mL via INTRAVENOUS

## 2019-03-11 MED ORDER — ENOXAPARIN SODIUM 40 MG/0.4ML ~~LOC~~ SOLN
40.0000 mg | SUBCUTANEOUS | Status: DC
  Administered 2019-03-12 (×2): 40 mg via SUBCUTANEOUS
  Filled 2019-03-11 (×3): qty 0.4

## 2019-03-11 MED ORDER — LEVOTHYROXINE SODIUM 50 MCG PO TABS
50.0000 ug | ORAL_TABLET | Freq: Every day | ORAL | Status: DC
  Administered 2019-03-12 – 2019-03-14 (×3): 50 ug via ORAL
  Filled 2019-03-11 (×3): qty 1

## 2019-03-11 MED ORDER — SODIUM CHLORIDE 0.9 % IV BOLUS
250.0000 mL | Freq: Once | INTRAVENOUS | Status: AC
  Administered 2019-03-11: 250 mL via INTRAVENOUS

## 2019-03-11 MED ORDER — SODIUM CHLORIDE 0.9 % IV SOLN
INTRAVENOUS | Status: DC
  Administered 2019-03-11 – 2019-03-14 (×5): via INTRAVENOUS

## 2019-03-11 MED ORDER — CLOZAPINE 100 MG PO TABS
100.0000 mg | ORAL_TABLET | Freq: Every day | ORAL | Status: DC
  Administered 2019-03-12 – 2019-03-13 (×2): 100 mg via ORAL
  Filled 2019-03-11 (×2): qty 1

## 2019-03-11 NOTE — ED Triage Notes (Signed)
[facility]  Pt was seen by the NP at the facility and the paper work states "Pt has had a mental and physical decline over the past month.  He was having worsening delusions and agitation and seroquel was increased."  Pt is incontinent of urine, and EMS reports he is very hard to arouse.  EMS EKG is unremarkable.
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
## 2019-03-11 NOTE — H&P (Signed)
History and Physical    Anthony GarterBenjamin Solis ZOX:[id number] DOB: [birth year] DOA: 03/11/2019  PCP: Default, Provider, MD   Patient coming from: SNF   I have personally briefly reviewed patient[facility]  Chief Complaint: sent for altered mental status  HPI: Anthony Solis is a 63 y.o. male with medical history significant of GERD, hypertension, hyperlipidemia, schizophrenia, hypothyroidism, IBS, h/o Hepatitis C, [facility] status going on for 4 weeks. On my exam, he was more alert, told me his name and date of birth, couldn't tell me the place or time. He reports pain in the right eye and in the left leg. He denies any nausea, vomiting or abd pain. He denies fever or chills. He appears to have a dry cough. No sob or chest pain. Patient reports pain in the right eye of unclear duration.  He is referred to medical service for admission for AMS.     ED Course: he is afebrile, slightly tachycardic normotensive.  Labs revealed elevated lactic acid of 2.4, COVID-19 negative, urine analysis negative, UDS is negative.  BMP showed potassium of 3.1, elevated AST and ALT.  WBC count of 15.4, Tylenol level negative.  CT of the head negative for acute pathology.  Chest x-ray does not show any active disease.  EKG shows sinus tachycardia with borderline QT prolongation  Review of Systems: Detailed ROS could not be obtained due to altered mental status   Past Medical History:  Diagnosis Date  . Drug abuse (HCC)   . GERD (gastroesophageal reflux disease)   . Hyperlipidemia   . Hypertension   . IBS (irritable bowel syndrome)   . Polysubstance abuse (HCC)   . Schizophrenia, schizo-affective type (HCC)   . Thyroid disease     History reviewed. No pertinent surgical history.   reports that he has been smoking. He has never used smokeless tobacco. He reports current alcohol use. He reports that he does not use drugs.  No  Known Allergies  No family history on file. Family history could not be reviewed due to altered mental status  Prior to Admission medications   Medication Sig Start Date End Date Taking? Authorizing Provider  benztropine (COGENTIN) 0.5 MG tablet Take 0.5 mg by mouth 2 (two) times daily.    [provider]  cholecalciferol (VITAMIN D) 400 UNITS TABS tablet Take 400 Units by mouth daily.    [provider]  cloZAPine (CLOZARIL) 100 MG tablet Take 200 mg by mouth at bedtime.    [provider]  cloZAPine (CLOZARIL) 100 MG tablet Take 100 mg by mouth daily.    [provider]  dicyclomine (BENTYL) 20 MG tablet Take 20 mg by mouth every 6 (six) hours.    [provider]  docusate sodium (COLACE) 100 MG capsule Take 100 mg by mouth daily.    [provider]  Fenofibrate (FENOGLIDE) 120 MG TABS Take 120 mg by mouth daily.    [provider]  levothyroxine (SYNTHROID, LEVOTHROID) 50 MCG tablet Take 50 mcg by mouth daily.    [provider]  mirtazapine (REMERON) 15 MG tablet Take 15 mg by mouth at bedtime.    [provider]  Multiple Vitamin (MULTIVITAMIN) tablet Take 1 tablet by mouth daily.    [provider]  ranitidine (ZANTAC) 150 MG tablet Take 150 mg by mouth daily.    [provider]  sennosides-docusate sodium (SENOKOT-S) 8.6-50 MG tablet Take 2  tablets by mouth daily. 09/26/13   Marisa Severintter, Olga, MD  simvastatin (ZOCOR) 20 MG tablet Take 20 mg by mouth every evening.    [provider]    Physical Exam: Vitals:   03/11/19 1200 03/11/19 1330 03/11/19 1400 03/11/19 1630  BP: 118/71 127/81 (!) 143/128 123/77  Pulse: 95 84 96 85  Resp: 12 13 20 11   Temp:      TempSrc:      SpO2: 97% 99% 99% 97%    Constitutional: Ill-appearing, cachectic looking Vitals:   03/11/19 1200 03/11/19 1330 03/11/19 1400 03/11/19 1630  BP: 118/71 127/81 (!) 143/128 123/77  Pulse: 95 84 96 85  Resp: 12 13  20 11   Temp:      TempSrc:      SpO2: 97% 99% 99% 97%   Eyes: PERRL, erythematous conjunctiva on the right ENMT: Mucous membranes are dry Neck: normal, supple, no masses, no thyromegaly Respiratory: clear to auscultation bilaterally,. Normal respiratory effort. No accessory muscle use.  Cardiovascular: Regular rate and rhythm, no murmur , trace pedal edema abdomen: no tenderness, no masses palpated. No hepatosplenomegaly. Bowel sounds positive.  Musculoskeletal: Pedal edema on the left foot, tenderness of the left foot Skin: no rashes, lesions, ulcers. No induration Neurologic: Patient alert and oriented to person only.  Able to move his upper extremities.  Patient reports unable to move left lower extremity due to pain. Psychiatric: Patient does not appear to be agitated.   Labs on Admission: I have personally reviewed following labs and imaging studies  CBC: Recent Labs  Lab 03/11/19 1219  WBC 15.4*  HGB 13.0  HCT 39.2  MCV 95.8  PLT 171   Basic Metabolic Panel: Recent Labs  Lab 03/11/19 1219  NA 142  K 3.1*  CL 104  CO2 26  GLUCOSE 97  BUN 14  CREATININE 0.92  CALCIUM 9.3   GFR: CrCl cannot be calculated (Unknown ideal weight.). Liver Function Tests: Recent Labs  Lab 03/11/19 1219  AST 132*  ALT 111*  ALKPHOS 33*  BILITOT 1.2  PROT 6.6  ALBUMIN 3.9   No results for input(s): LIPASE, AMYLASE in the last 168 hours. Recent Labs  Lab 03/11/19 1236  AMMONIA 11   Coagulation Profile: No results for input(s): INR, PROTIME in the last 168 hours. Cardiac Enzymes: No results for input(s): CKTOTAL, CKMB, CKMBINDEX, TROPONINI in the last 168 hours. BNP (last 3 results) No results for input(s): PROBNP in the last 8760 hours. HbA1C: No results for input(s): HGBA1C in the last 72 hours. CBG: Recent Labs  Lab 03/11/19 1218  GLUCAP 92   Lipid Profile: No results for input(s): CHOL, HDL, LDLCALC, TRIG, CHOLHDL, LDLDIRECT in the last 72 hours. Thyroid  Function Tests: No results for input(s): TSH, T4TOTAL, FREET4, T3FREE, THYROIDAB in the last 72 hours. Anemia Panel: No results for input(s): VITAMINB12, FOLATE, FERRITIN, TIBC, IRON, RETICCTPCT in the last 72 hours. Urine analysis:    Component Value Date/Time   COLORURINE YELLOW 03/11/2019 1223   APPEARANCEUR CLEAR 03/11/2019 1223   LABSPEC 1.009 03/11/2019 1223   PHURINE 6.0 03/11/2019 1223   GLUCOSEU NEGATIVE 03/11/2019 1223   HGBUR NEGATIVE 03/11/2019 1223   BILIRUBINUR NEGATIVE 03/11/2019 1223   KETONESUR NEGATIVE 03/11/2019 1223   PROTEINUR NEGATIVE 03/11/2019 1223   UROBILINOGEN 1.0 08/21/2012 0456   NITRITE NEGATIVE 03/11/2019 1223   LEUKOCYTESUR NEGATIVE 03/11/2019 1223    Radiological Exams on Admission: Ct Head Wo Contrast  Result Date: 03/11/2019 CLINICAL DATA:  Altered mental status  and incontinent EXAM: CT HEAD WITHOUT CONTRAST TECHNIQUE: Contiguous axial images were obtained from the base of the skull through the vertex without intravenous contrast. COMPARISON:  12/03/2016 FINDINGS: Brain: No evidence of acute infarction, hemorrhage, hydrocephalus, extra-axial collection or mass lesion/mass effect. Vascular: No hyperdense vessel or unexpected calcification. Skull: Normal. Negative for fracture or focal lesion. Sinuses/Orbits: No acute finding. Other: None. IMPRESSION: Normal head CT Electronically Signed   By: Inez Catalina M.D.   On: 03/11/2019 15:33   Dg Chest Portable 1 View  Result Date: 03/11/2019 CLINICAL DATA:  Altered mental status. EXAM: PORTABLE CHEST 1 VIEW COMPARISON:  Chest x-rays dated 12/03/2016 and 06/23/2010. FINDINGS: Heart size and mediastinal contours are within normal limits. Lungs are clear. No pleural effusions seen. Osseous structures about the chest are unremarkable. IMPRESSION: No active disease.  No evidence of pneumonia or pulmonary edema. Electronically Signed   By: Franki Cabot M.D.   On: 03/11/2019 12:26    EKG: Independently reviewed.   Sinus rhythm with borderline prolonged QT interval  Assessment/Plan Active Problems:   Acute encephalopathy   Acute encephalopathy Differential includes secondary to dehydration from poor oral intake versus from polypharmacy. I have held the Cogentin and clozapine for now. Gently hydrate the patient and check for TSH, vitamin B12 and follow blood cultures and urine cultures. If his mental status does not improve in the next 24 hours please follow-up with an MRI of the brain and possibly a neurological evaluation. slp evaluation ordered.  Patient denies any back pain.  Leukocytosis Unclear etiology so far no signs of infection on UA or chest x-ray in the skin does not show any signs of cellulitis. Repeat CBC tomorrow    Hypokalemia Replace as needed.   Hypertension Well-controlled    Mildly elevated liver enzymes Possibly secondary to statins and fenofibrate. Patient denies any nausea vomiting or abdominal pain. Check CK levels.   Right eye pain with slight erythema of the conjunctiva: No vesicular lesions near by  to indicate zoster.  When I examined the patient, one end of the mask was in his right eye, pulled it down so it does not bother his right eye.  If he continues to have pain in the right eye, will need further work up.  Pain control for now.    DVT prophylaxis: Lovenox Code Status: Presumed full code Family Communication: None at bedside Disposition Plan: Pending further work-up Consults called: None Admission status:/Telemetry   Hosie Poisson MD [facility] Pager [phone]  If 7PM-7AM, please contact night-coverage [email or web address] Password [email or web address]  03/11/2019, 6:01 PM

## 2019-03-12 ENCOUNTER — Ambulatory Visit (HOSPITAL_COMMUNITY): Payer: Medicare Other

## 2019-03-12 ENCOUNTER — Other Ambulatory Visit: Payer: Self-pay

## 2019-03-12 DIAGNOSIS — E785 Hyperlipidemia, unspecified: Secondary | ICD-10-CM | POA: Diagnosis present

## 2019-03-12 DIAGNOSIS — F209 Schizophrenia, unspecified: Secondary | ICD-10-CM

## 2019-03-12 DIAGNOSIS — Z8619 Personal history of other infectious and parasitic diseases: Secondary | ICD-10-CM | POA: Diagnosis not present

## 2019-03-12 DIAGNOSIS — R4781 Slurred speech: Secondary | ICD-10-CM | POA: Diagnosis present

## 2019-03-12 DIAGNOSIS — K219 Gastro-esophageal reflux disease without esophagitis: Secondary | ICD-10-CM | POA: Diagnosis present

## 2019-03-12 DIAGNOSIS — R52 Pain, unspecified: Secondary | ICD-10-CM | POA: Diagnosis not present

## 2019-03-12 DIAGNOSIS — K589 Irritable bowel syndrome without diarrhea: Secondary | ICD-10-CM | POA: Diagnosis present

## 2019-03-12 DIAGNOSIS — Z79899 Other long term (current) drug therapy: Secondary | ICD-10-CM | POA: Diagnosis not present

## 2019-03-12 DIAGNOSIS — D72829 Elevated white blood cell count, unspecified: Secondary | ICD-10-CM | POA: Diagnosis present

## 2019-03-12 DIAGNOSIS — R32 Unspecified urinary incontinence: Secondary | ICD-10-CM | POA: Diagnosis present

## 2019-03-12 DIAGNOSIS — E039 Hypothyroidism, unspecified: Secondary | ICD-10-CM | POA: Diagnosis present

## 2019-03-12 DIAGNOSIS — R740 Nonspecific elevation of levels of transaminase and lactic acid dehydrogenase [LDH]: Secondary | ICD-10-CM | POA: Diagnosis present

## 2019-03-12 DIAGNOSIS — R609 Edema, unspecified: Secondary | ICD-10-CM

## 2019-03-12 DIAGNOSIS — Z20828 Contact with and (suspected) exposure to other viral communicable diseases: Secondary | ICD-10-CM | POA: Diagnosis present

## 2019-03-12 DIAGNOSIS — G9340 Encephalopathy, unspecified: Secondary | ICD-10-CM | POA: Diagnosis present

## 2019-03-12 DIAGNOSIS — H5711 Ocular pain, right eye: Secondary | ICD-10-CM | POA: Diagnosis present

## 2019-03-12 DIAGNOSIS — Z79890 Hormone replacement therapy (postmenopausal): Secondary | ICD-10-CM | POA: Diagnosis not present

## 2019-03-12 DIAGNOSIS — I10 Essential (primary) hypertension: Secondary | ICD-10-CM | POA: Diagnosis present

## 2019-03-12 DIAGNOSIS — F17200 Nicotine dependence, unspecified, uncomplicated: Secondary | ICD-10-CM | POA: Diagnosis present

## 2019-03-12 DIAGNOSIS — E876 Hypokalemia: Secondary | ICD-10-CM | POA: Diagnosis present

## 2019-03-12 DIAGNOSIS — R748 Abnormal levels of other serum enzymes: Secondary | ICD-10-CM | POA: Diagnosis present

## 2019-03-12 LAB — CBC WITH DIFFERENTIAL/PLATELET
Abs Immature Granulocytes: 0.05 10*3/uL (ref 0.00–0.07)
Basophils Absolute: 0.1 10*3/uL (ref 0.0–0.1)
Basophils Relative: 1 %
Eosinophils Absolute: 0.2 10*3/uL (ref 0.0–0.5)
Eosinophils Relative: 2 %
HCT: 37.3 % — ABNORMAL LOW (ref 39.0–52.0)
Hemoglobin: 11.9 g/dL — ABNORMAL LOW (ref 13.0–17.0)
Immature Granulocytes: 1 %
Lymphocytes Relative: 22 %
Lymphs Abs: 1.9 10*3/uL (ref 0.7–4.0)
MCH: 31 pg (ref 26.0–34.0)
MCHC: 31.9 g/dL (ref 30.0–36.0)
MCV: 97.1 fL (ref 80.0–100.0)
Monocytes Absolute: 0.9 10*3/uL (ref 0.1–1.0)
Monocytes Relative: 10 %
Neutro Abs: 5.7 10*3/uL (ref 1.7–7.7)
Neutrophils Relative %: 64 %
Platelets: 162 10*3/uL (ref 150–400)
RBC: 3.84 MIL/uL — ABNORMAL LOW (ref 4.22–5.81)
RDW: 13 % (ref 11.5–15.5)
WBC: 8.8 10*3/uL (ref 4.0–10.5)
nRBC: 0 % (ref 0.0–0.2)

## 2019-03-12 LAB — BASIC METABOLIC PANEL WITH GFR
Anion gap: 9 (ref 5–15)
BUN: 13 mg/dL (ref 6–20)
CO2: 25 mmol/L (ref 22–32)
Calcium: 8.9 mg/dL (ref 8.9–10.3)
Chloride: 108 mmol/L (ref 98–111)
Creatinine, Ser: 0.73 mg/dL (ref 0.61–1.24)
GFR calc Af Amer: >60 mL/min (ref >60)
GFR calc non Af Amer: >60 mL/min (ref >60)
Glucose, Bld: 78 mg/dL (ref 70–99)
Potassium: 3.2 mmol/L — ABNORMAL LOW (ref 3.5–5.1)
Sodium: 142 mmol/L (ref 135–145)

## 2019-03-12 LAB — LACTIC ACID, PLASMA: Lactic Acid, Venous: 1.5 mmol/L (ref 0.5–1.9)

## 2019-03-12 LAB — URINE CULTURE: Culture: <10000 — AB

## 2019-03-12 LAB — PHOSPHORUS: Phosphorus: 3.2 mg/dL (ref 2.5–4.6)

## 2019-03-12 LAB — VITAMIN B12: Vitamin B-12: 1061 pg/mL — ABNORMAL HIGH (ref 180–914)

## 2019-03-12 LAB — MRSA PCR SCREENING: MRSA by PCR: NEGATIVE

## 2019-03-12 LAB — CK
Total CK: 936 U/L — ABNORMAL HIGH (ref 49–397)
Total CK: 511 U/L — ABNORMAL HIGH (ref 49–397)

## 2019-03-12 LAB — MAGNESIUM: Magnesium: 2 mg/dL (ref 1.7–2.4)

## 2019-03-12 LAB — TSH: TSH: 1.263 u[IU]/mL (ref 0.350–4.500)

## 2019-03-12 MED ORDER — POTASSIUM CHLORIDE 10 MEQ/100ML IV SOLN
10.0000 meq | INTRAVENOUS | Status: AC
  Administered 2019-03-12 (×2): 10 meq via INTRAVENOUS
  Filled 2019-03-12 (×2): qty 100

## 2019-03-12 MED ORDER — MORPHINE SULFATE (PF) 2 MG/ML IV SOLN
2.0000 mg | Freq: Once | INTRAVENOUS | Status: AC
  Administered 2019-03-12: 2 mg via INTRAVENOUS
  Filled 2019-03-12: qty 1

## 2019-03-12 NOTE — Progress Notes (Signed)
Patient unable to urinate. Bladder scan 484. Patient complaint of pain. Paged floor coverage.

## 2019-03-12 NOTE — Progress Notes (Signed)
PT Cancellation Note  Patient Details Name: Hamlin Devine MRN: [id number] DOB: Feb 21, 1956   Cancelled Treatment:    Reason Eval/Treat Not Completed: Medical issues which prohibited therapy(dopplers to d/o DVT are pending. Will follow.)  Philomena Doheny PT 03/12/2019  Acute Rehabilitation Services Pager [phone] Office [phone]

## 2019-03-12 NOTE — Progress Notes (Signed)
PT Cancellation Note  Patient Details Name: Anthony Solis MRN: [id number] DOB: Oct 10, 1955   Cancelled Treatment:    Reason Eval/Treat Not Completed: Patient declined, no reason specified(pt refused several times to attempt mobility despite encouragement to do so. He stated his, "feet feel heavy" and doesn't feel he can ambulate, he refused to try to ambulate. Will follow.)  Philomena Doheny PT 03/12/2019  Acute Rehabilitation Services Pager [phone] Office [phone]

## 2019-03-12 NOTE — Progress Notes (Signed)
OT Cancellation Note  Patient Details Name: Anthony Solis MRN: [id number] DOB: March 01, 1956   Cancelled Treatment:    Reason Eval/Treat Not Completed: Other (comment).  Per RN, pt to have [place] to r/o DVT. Will check back after this is complete.   [place] 03/12/2019, 11:24 AM  Lesle Chris, OTR/L Acute Rehabilitation Services [phone] [facility] pager [phone] office 03/12/2019

## 2019-03-12 NOTE — Evaluation (Signed)
Occupational Therapy Evaluation Patient Details Name: Anthony GarterBenjamin Solis MRN: [id number] DOB: [birth year] Today's Date: 03/12/2019    History of Present Illness 63 year old man from Group Home admitted for AMS lasting 4 weeks.  PMH:  HTN, schizophrenia, Hep C and polysubstance abuse   Clinical Impression   Pt was admitted for the above.  Pt reported he was independent prior to admission, but CM called and Group Home reported that they assisted with adls.  Will follow in acute setting with min guard to supervision level goals as stated below.  Pt cooperative; he needs extra time to respond    Follow Up Recommendations  (back to group home, if they can provide needed A)    Equipment Recommendations  (tba further)    Recommendations for Other Services       Precautions / Restrictions Precautions Precautions: Fall Restrictions Weight Bearing Restrictions: No      Mobility Bed Mobility Overal bed mobility: Needs Assistance Bed Mobility: Supine to Sit     Supine to sit: Supervision     General bed mobility comments: extra time and assist for lines  Transfers Overall transfer level: Needs assistance Equipment used: Rolling walker (2 wheeled) Transfers: Sit to/from [employer] CorporationStand;Stand Pivot Transfers Sit to Stand: Min guard Stand pivot transfers: Min guard       General transfer comment: for safety    Balance                                           ADL either performed or assessed with clinical judgement   ADL Overall ADL's : Needs assistance/impaired Eating/Feeding: Set up   Grooming: Set up   Upper Body Bathing: Minimal assistance   Lower Body Bathing: Moderate assistance   Upper Body Dressing : Minimal assistance   Lower Body Dressing: Moderate assistance Lower Body Dressing Details (indicate cue type and reason): wears flip flops.  Toilet Transfer: Min guard;Stand-pivot;RW(chair)             General ADL Comments: Pt agreeable to up to  chair.  Would not attempt socks--used his flip flops     Vision         Perception     Praxis      Pertinent Vitals/Pain Pain Assessment: No/denies pain     Hand Dominance     Extremity/Trunk Assessment Upper Extremity Assessment Upper Extremity Assessment: Generalized weakness(grossly 3/5)           Communication Communication Communication: (very soft spoken)   Cognition Arousal/Alertness: Awake/alert Behavior During Therapy: WFL for tasks assessed/performed Overall Cognitive Status: No family/caregiver present to determine baseline cognitive functioning                                 General Comments: did not answer all questions, delayed responses verbally and motorically   General Comments       Exercises     Shoulder Instructions      Home Living                                   Additional Comments: from Group Home      Prior Functioning/Environment          Comments: per TOC notes, needed assist with adls  OT Problem List: Decreased strength;Decreased activity tolerance;Impaired balance (sitting and/or standing);Decreased cognition;Decreased safety awareness      OT Treatment/Interventions: DME and/or AE instruction;Balance training;Patient/family education;Therapeutic activities;Self-care/ADL training;Cognitive remediation/compensation;Therapeutic exercise;Energy conservation    OT Goals(Current goals can be found in the care plan section) Acute Rehab OT Goals Patient Stated Goal: none stated OT Goal Formulation: Patient unable to participate in goal setting Time For Goal Achievement: 03/26/19 Potential to Achieve Goals: Good ADL Goals Pt Will Perform Grooming: with supervision;standing Pt Will Transfer to Toilet: with min guard assist;ambulating;regular height toilet Additional ADL Goal #1: pt will follow basic commands in context 75% of time within 5 seconds Additional ADL Goal #2: Pt will  participate in bil UE strengthening program AROM > level one theraband x 10 reps x 3 exercises with min cues  OT Frequency: Min 2X/week   Barriers to D/C:            Co-evaluation              AM-PAC OT "6 Clicks" Daily Activity     Outcome Measure Help from another person eating meals?: A Little Help from another person taking care of personal grooming?: A Little Help from another person toileting, which includes using toliet, bedpan, or urinal?: A Little Help from another person bathing (including washing, rinsing, drying)?: A Lot Help from another person to put on and taking off regular upper body clothing?: A Little Help from another person to put on and taking off regular lower body clothing?: A Lot 6 Click Score: 16   End of Session    Activity Tolerance: Patient tolerated treatment well Patient left: in chair;with call bell/phone within reach;with chair alarm set  OT Visit Diagnosis: Unsteadiness on feet (R26.81);Muscle weakness (generalized) (M62.81)                Time: 1062-6948 OT Time Calculation (min): 22 min Charges:  OT General Charges $OT Visit: 1 Visit OT Evaluation $OT Eval Low Complexity: [place], OTR/L Acute Rehabilitation Services [phone] [facility] pager [phone] office 03/12/2019  [place] 03/12/2019, 3:02 PM

## 2019-03-12 NOTE — Progress Notes (Signed)
Left lower extremity venous duplex completed. Preliminary results in Chart review CV Proc. [facility], [place] 03/12/2019, 12:37 PM

## 2019-03-12 NOTE — Progress Notes (Signed)
PROGRESS NOTE    Anthony Solis  ZOX:[id number] DOB: [birth year] DOA: 03/11/2019 PCP: Default, Provider, MD   Brief Narrative:  Anthony Solis is a 63 y.o. male with medical history significant of GERD, hypertension, hyperlipidemia, schizophrenia, hypothyroidism, IBS, h/o Hepatitis C, comes from [facility] for altered mental status going on for 4 weeks. On my exam, he was more alert, told me his name and date of birth, couldn't tell me the place or time. He reports pain in the right eye and in the left leg. He denies any nausea, vomiting or abd pain. He denies fever or chills. He appears to have a dry cough. No sob or chest pain. Patient reports pain in the right eye of unclear duration.  He is referred to medical service for admission for AMS.     ED Course: he is afebrile, slightly tachycardic normotensive.  Labs revealed elevated lactic acid of 2.4, COVID-19 negative, urine analysis negative, UDS is negative.  BMP showed potassium of 3.1, elevated AST and ALT.  WBC count of 15.4, Tylenol level negative.  CT of the head negative for acute pathology.  Chest x-ray does not show any active disease.  EKG shows sinus tachycardia with borderline QT prolongation   Assessment & Plan:   Active Problems:   Acute encephalopathy  Encephalopathy acute versus chronic.  Called patient's living facility to get further information they only provide much meaningful history.  Agree with holding Cogentin and clozapine.  Gently hydrating, urine culture with insignificant growth, blood cultures negative, white blood cell count normal, UA negative for signs of infection UDS negative.  We will continue to hydrate monitor additional 24 hours, try to obtain more information from family members and staff facility where he stays. Clozapine Cogentin is been held will consider psychiatry consultation in the morning  Leukocytosis Resolved no evidence of infection  Hypokalemia Replace as  needed.  Hypertension Well-controlled  Mildly elevated liver enzymes Possibly secondary to statins and fenofibrate. We will hold these medications, Recheck in the morning after patient's been appropriately rehydrated . Elevated CK. We will trend, monitor renal function, increase hydration from 75 mils per hour  Right eye pain with slight erythema of the conjunctiva: It was noted patient had corner of mask in his eye on admission by evaluation Denies any eye pain on my eval today No evidence of infection  DVT prophylaxis: Lovenox SQ  Code Status: FULL    Code Status Orders  (From admission, onward)         Start     Ordered   03/11/19 2149  Full code  Continuous     03/11/19 2148        Code Status History    Date Active Date Inactive Code Status Order ID Comments User Context   12/03/2016 1313 12/05/2016 1954 Full Code [id number]  Alba Cory, MD Inpatient   Advance Care Planning Activity     Family Communication: NONE TODAY  Disposition Plan:   Patient will remain an additional day for continued hydration, monitoring of abnormalities with labs to include CK, possible further radiographic evaluation and possible subspecialties consultation.  Without these treatments patient at risk of severe clinical deterioration Consults called: None Admission status: Inpatient   Consultants:   None  Procedures:  Ct Head Wo Contrast  Result Date: 03/11/2019 CLINICAL DATA:  Altered mental status and incontinent EXAM: CT HEAD WITHOUT CONTRAST TECHNIQUE: Contiguous axial images were obtained from the base of the skull through the vertex without intravenous  contrast. COMPARISON:  12/03/2016 FINDINGS: Brain: No evidence of acute infarction, hemorrhage, hydrocephalus, extra-axial collection or mass lesion/mass effect. Vascular: No hyperdense vessel or unexpected calcification. Skull: Normal. Negative for fracture or focal lesion. Sinuses/Orbits: No acute finding. Other: None.  IMPRESSION: Normal head CT Electronically Signed   By: Alcide CleverMark  Lukens M.D.   On: 03/11/2019 15:33   Dg Chest Portable 1 View  Result Date: 03/11/2019 CLINICAL DATA:  Altered mental status. EXAM: PORTABLE CHEST 1 VIEW COMPARISON:  Chest x-rays dated 12/03/2016 and 06/23/2010. FINDINGS: Heart size and mediastinal contours are within normal limits. Lungs are clear. No pleural effusions seen. Osseous structures about the chest are unremarkable. IMPRESSION: No active disease.  No evidence of pneumonia or pulmonary edema. Electronically Signed   By: Bary RichardStan  Maynard M.D.   On: 03/11/2019 12:26   Vas Koreas Lower Extremity Venous (dvt)  Result Date: 03/12/2019  Lower Venous Study Indications: Pain, and Edema.  Comparison Study: No previous exam available for comparison Performing Technologist: Toma DeitersVirginia Slaughter RVS  Examination Guidelines: A complete evaluation includes B-mode imaging, spectral Doppler, color Doppler, and power Doppler as needed of all accessible portions of each vessel. Bilateral testing is considered an integral part of a complete examination. Limited examinations for reoccurring indications may be performed as noted.  +-----+---------------+---------+-----------+----------+-------+  RIGHT Compressibility Phasicity Spontaneity Properties Summary  +-----+---------------+---------+-----------+----------+-------+  CFV   Full            Yes       Yes                             +-----+---------------+---------+-----------+----------+-------+  SFJ   Full                                                      +-----+---------------+---------+-----------+----------+-------+   +---------+---------------+---------+-----------+----------+-------+  LEFT      Compressibility Phasicity Spontaneity Properties Summary  +---------+---------------+---------+-----------+----------+-------+  CFV       Full            Yes       Yes                              +---------+---------------+---------+-----------+----------+-------+  SFJ       Full                                                      +---------+---------------+---------+-----------+----------+-------+  FV Prox   Full            Yes       Yes                             +---------+---------------+---------+-----------+----------+-------+  FV Mid    Full                                                      +---------+---------------+---------+-----------+----------+-------+  FV Distal Full  Yes       Yes                             +---------+---------------+---------+-----------+----------+-------+  PFV       Full            Yes       Yes                             +---------+---------------+---------+-----------+----------+-------+  POP       Full            Yes       Yes                             +---------+---------------+---------+-----------+----------+-------+  PTV       Full                                                      +---------+---------------+---------+-----------+----------+-------+  PERO      Full                                                      +---------+---------------+---------+-----------+----------+-------+     Summary: Right: There is no evidence of a common femoral vein obstruction Left: There is no evidence of deep vein thrombosis in the lower extremity. No cystic structure found in the popliteal fossa.  *See table(s) above for measurements and observations.    Preliminary      Antimicrobials:   NONE    Subjective: Mildly confused the setting of CHRONIC schizophrenia   Objective: Vitals:   03/11/19 2103 03/11/19 2141 03/11/19 2203 03/12/19 0518  BP: 102/76 115/79  124/87  Pulse: 89 92  86  Resp: 16 19  20   Temp:  (!) 97.4 F (36.3 C)  (!) 97.5 F (36.4 C)  TempSrc:  Oral  Oral  SpO2: 96% 98%  97%  Weight:   70.5 kg   Height:   5\' 9"  (1.753 m)     Intake/Output Summary (Last 24 hours) at 03/12/2019 1435 Last data filed at 03/12/2019  1000 Gross per 24 hour  Intake 1054.08 ml  Output 400 ml  Net 654.08 ml   Filed Weights   03/11/19 2203  Weight: 70.5 kg    Examination:  General exam: Appears calm and comfortable, TANGENTIAL THOUGHT PROCESS Respiratory system: Clear to auscultation. Respiratory effort normal. Cardiovascular system: S1 & S2 heard, RRR. No JVD, murmurs, rubs, gallops or clicks. No pedal edema. Gastrointestinal system: Abdomen is nondistended, soft and nontender. No organomegaly or masses felt. Normal bowel sounds heard. Central nervous system: Alert and oriented. No focal neurological deficits. Extremities: Symmetric 5 x 5 power. Skin: No rashes, lesions or ulcers Psychiatry: Judgement and insight impaired. Mood & affect flat    Data Reviewed: I have personally reviewed following labs and imaging studies  CBC: Recent Labs  Lab 03/11/19 1219 03/12/19 0051  WBC 15.4* 8.8  NEUTROABS  --  5.7  HGB 13.0 11.9*  HCT 39.2 37.3*  MCV 95.8 97.1  PLT  171 162   Basic Metabolic Panel: Recent Labs  Lab 03/11/19 1219 03/11/19 1456 03/12/19 0101  NA 142  --  142  K 3.1*  --  3.2*  CL 104  --  108  CO2 26  --  25  GLUCOSE 97  --  78  BUN 14  --  13  CREATININE 0.92  --  0.73  CALCIUM 9.3  --  8.9  MG  --  2.0 2.0  PHOS  --   --  3.2   GFR: Estimated Creatinine Clearance: 97.9 mL/min (by C-G formula based on SCr of 0.73 mg/dL). Liver Function Tests: Recent Labs  Lab 03/11/19 1219  AST 132*  ALT 111*  ALKPHOS 33*  BILITOT 1.2  PROT 6.6  ALBUMIN 3.9   No results for input(s): LIPASE, AMYLASE in the last 168 hours. Recent Labs  Lab 03/11/19 1236  AMMONIA 11   Coagulation Profile: No results for input(s): INR, PROTIME in the last 168 hours. Cardiac Enzymes: Recent Labs  Lab 03/12/19 0101  CKTOTAL 936*   BNP (last 3 results) No results for input(s): PROBNP in the last 8760 hours. HbA1C: No results for input(s): HGBA1C in the last 72 hours. CBG: Recent Labs  Lab  03/11/19 1218  GLUCAP 92   Lipid Profile: No results for input(s): CHOL, HDL, LDLCALC, TRIG, CHOLHDL, LDLDIRECT in the last 72 hours. Thyroid Function Tests: Recent Labs    03/12/19 0101  TSH 1.263   Anemia Panel: Recent Labs    03/12/19 0101  VITAMINB12 1,061*   Sepsis Labs: Recent Labs  Lab 03/11/19 1456 03/11/19 1457 03/12/19 0101  LATICACIDVEN 2.4* 1.5 1.5    Recent Results (from the past 240 hour(s))  Blood culture (routine x 2)     Status: None (Preliminary result)   Collection Time: 03/11/19 12:22 PM   Specimen: BLOOD  Result Value Ref Range Status   Specimen Description   Final    BLOOD LEFT ANTECUBITAL Performed at [facility], [address], [place], [place] [address]    Special Requests   Final    BOTTLES DRAWN AEROBIC AND ANAEROBIC Blood Culture adequate volume Performed at [facility], [address], [place], [place] [address]    Culture   Final    NO GROWTH < 24 HOURS Performed at [facility], [address], [place], [place] [address]    Report Status PENDING  Incomplete  Urine culture     Status: Abnormal   Collection Time: 03/11/19 12:23 PM   Specimen: Urine, Random  Result Value Ref Range Status   Specimen Description   Final    URINE, RANDOM Performed at [facility], [address], [place], [place] [address]    Special Requests   Final    NONE Performed at [facility], [address], [place], [place] [address]    Culture (A)  Final    <10,000 COLONIES/mL INSIGNIFICANT GROWTH Performed at [facility], [address], [place], [place] [address]    Report Status 03/12/2019 FINAL  Final  SARS Coronavirus 2 (CEPHEID - Performed in [facility] hospital lab), Hosp Order     Status: None   Collection Time: 03/11/19  1:20 PM   Specimen: Nasopharyngeal Swab  Result Value Ref Range Status   SARS Coronavirus 2 NEGATIVE NEGATIVE Final    Comment: (NOTE) If result is  NEGATIVE SARS-CoV-2 target nucleic acids are NOT DETECTED. The SARS-CoV-2 RNA  is generally detectable in upper and lower  respiratory specimens during the acute phase of infection. The lowest  concentration of SARS-CoV-2 viral copies this assay can detect is 250  copies / mL. A negative result does not preclude SARS-CoV-2 infection  and should not be used as the sole basis for treatment or other  patient management decisions.  A negative result may occur with  improper specimen collection / handling, submission of specimen other  than nasopharyngeal swab, presence of viral mutation(s) within the  areas targeted by this assay, and inadequate number of viral copies  (<250 copies / mL). A negative result must be combined with clinical  observations, patient history, and epidemiological information. If result is POSITIVE SARS-CoV-2 target nucleic acids are DETECTED. The SARS-CoV-2 RNA is generally detectable in upper and lower  respiratory specimens dur ing the acute phase of infection.  Positive  results are indicative of active infection with SARS-CoV-2.  Clinical  correlation with patient history and other diagnostic information is  necessary to determine patient infection status.  Positive results do  not rule out bacterial infection or co-infection with other viruses. If result is PRESUMPTIVE POSTIVE SARS-CoV-2 nucleic acids MAY BE PRESENT.   A presumptive positive result was obtained on the submitted specimen  and confirmed on repeat testing.  While 2019 novel coronavirus  (SARS-CoV-2) nucleic acids may be present in the submitted sample  additional confirmatory testing may be necessary for epidemiological  and / or clinical management purposes  to differentiate between  SARS-CoV-2 and other Sarbecovirus currently known to infect humans.  If clinically indicated additional testing with an alternate test  methodology [phone]) is advised. The SARS-CoV-2 RNA is generally  detectable  in upper and lower respiratory sp ecimens during the acute  phase of infection. The expected result is Negative. Fact Sheet for Patients:  StrictlyIdeas.no Fact Sheet for Healthcare Providers: BankingDealers.co.za This test is not yet approved or cleared by the [place] FDA and has been authorized for detection and/or diagnosis of SARS-CoV-2 by FDA under an Emergency Use Authorization (EUA).  This EUA will remain in effect (meaning this test can be used) for the duration of the COVID-19 declaration under Section 564(b)(1) of the Act, 21 U.S.C. section 360bbb-3(b)(1), unless the authorization is terminated or revoked sooner. Performed at [facility], [place] [address] [place], [place] [address]   Blood culture (routine x 2)     Status: None (Preliminary result)   Collection Time: 03/11/19  1:20 PM   Specimen: BLOOD  Result Value Ref Range Status   Specimen Description   Final    BLOOD RIGHT ANTECUBITAL Performed at [facility] [address] [place], [place] [address]    Special Requests   Final    BOTTLES DRAWN AEROBIC AND ANAEROBIC Blood Culture adequate volume Performed at [facility] [address], [place], [place] [address]    Culture   Final    NO GROWTH < 24 HOURS Performed at [facility] [address] [place], [place] [address]    Report Status PENDING  Incomplete  MRSA PCR Screening     Status: None   Collection Time: 03/12/19 11:27 AM   Specimen: Nasopharyngeal  Result Value Ref Range Status   MRSA by PCR NEGATIVE NEGATIVE Final    Comment:        The GeneXpert MRSA Assay (FDA approved for NASAL specimens only), is one component of a comprehensive MRSA colonization surveillance program. It  is not intended to diagnose MRSA infection nor to guide or monitor treatment for MRSA infections. Performed at [facility], [address] [place], [place] [id number]          Radiology Studies: Ct Head Wo Contrast  Result Date: 03/11/2019 CLINICAL DATA:  Altered mental status and incontinent EXAM: CT HEAD WITHOUT CONTRAST TECHNIQUE: Contiguous axial images were obtained from the base of the skull through the vertex without intravenous contrast. COMPARISON:  12/03/2016 FINDINGS: Brain: No evidence of acute infarction, hemorrhage, hydrocephalus, extra-axial collection or mass lesion/mass effect. Vascular: No hyperdense vessel or unexpected calcification. Skull: Normal. Negative for fracture or focal lesion. Sinuses/Orbits: No acute finding. Other: None. IMPRESSION: Normal head CT Electronically Signed   By: Alcide CleverMark  Lukens M.D.   On: 03/11/2019 15:33   Dg Chest Portable 1 View  Result Date: 03/11/2019 CLINICAL DATA:  Altered mental status. EXAM: PORTABLE CHEST 1 VIEW COMPARISON:  Chest x-rays dated 12/03/2016 and 06/23/2010. FINDINGS: Heart size and mediastinal contours are within normal limits. Lungs are clear. No pleural effusions seen. Osseous structures about the chest are unremarkable. IMPRESSION: No active disease.  No evidence of pneumonia or pulmonary edema. Electronically Signed   By: Bary RichardStan  Maynard M.D.   On: 03/11/2019 12:26   Vas Koreas Lower Extremity Venous (dvt)  Result Date: 03/12/2019  Lower Venous Study Indications: Pain, and Edema.  Comparison Study: No previous exam available for comparison Performing Technologist: Toma DeitersVirginia Slaughter RVS  Examination Guidelines: A complete evaluation includes B-mode imaging, spectral Doppler, color Doppler, and power Doppler as needed of all accessible portions of each vessel. Bilateral testing is considered an integral part of a complete examination. Limited examinations for reoccurring indications may be performed as noted.  +-----+---------------+---------+-----------+----------+-------+  RIGHT Compressibility Phasicity Spontaneity Properties Summary   +-----+---------------+---------+-----------+----------+-------+  CFV   Full            Yes       Yes                             +-----+---------------+---------+-----------+----------+-------+  SFJ   Full                                                      +-----+---------------+---------+-----------+----------+-------+   +---------+---------------+---------+-----------+----------+-------+  LEFT      Compressibility Phasicity Spontaneity Properties Summary  +---------+---------------+---------+-----------+----------+-------+  CFV       Full            Yes       Yes                             +---------+---------------+---------+-----------+----------+-------+  SFJ       Full                                                      +---------+---------------+---------+-----------+----------+-------+  FV Prox   Full            Yes       Yes                             +---------+---------------+---------+-----------+----------+-------+  FV Mid    Full                                                      +---------+---------------+---------+-----------+----------+-------+  FV Distal Full            Yes       Yes                             +---------+---------------+---------+-----------+----------+-------+  PFV       Full            Yes       Yes                             +---------+---------------+---------+-----------+----------+-------+  POP       Full            Yes       Yes                             +---------+---------------+---------+-----------+----------+-------+  PTV       Full                                                      +---------+---------------+---------+-----------+----------+-------+  PERO      Full                                                      +---------+---------------+---------+-----------+----------+-------+     Summary: Right: There is no evidence of a common femoral vein obstruction Left: There is no evidence of deep vein thrombosis in the lower extremity. No cystic structure  found in the popliteal fossa.  *See table(s) above for measurements and observations.    Preliminary         Scheduled Meds:  cloZAPine  100 mg Oral Daily   enoxaparin (LOVENOX) injection  40 mg Subcutaneous Q24H   levothyroxine  50 mcg Oral Daily   Continuous Infusions:  sodium chloride 75 mL/hr at 03/12/19 0604     LOS: 0 days    Time spent: 35 min    Burke Keels, MD [facility]  If 7PM-7AM, please contact night-coverage  03/12/2019, 2:35 PM

## 2019-03-12 NOTE — TOC Initial Note (Signed)
Transition of Care [facility]) - Initial/Assessment Note    Patient Details  Name: Anthony Solis MRN: [id number] Date of Birth: [birth year]  [facility]) CM/SW Contact:    Dessa Phi, RN Phone Number: 03/12/2019, 2:04 PM  Clinical Narrative:  From [place] group home. TC to group home spoke to [facility] [phone]-patient able to return back may need taxi. Await PT/OT cons recc.                 Expected Discharge Plan: Group Home Barriers to Discharge: Continued Medical Work up   Patient Goals and CMS Choice Patient states their goals for this hospitalization and ongoing recovery are:: go home      Expected Discharge Plan and Services Expected Discharge Plan: Group Home   Discharge Planning Services: CM Consult   Living arrangements for the past 2 months: Group Home                                      Prior Living Arrangements/Services Living arrangements for the past 2 months: Group Home Lives with:: Roommate Patient language and need for interpreter reviewed:: Yes Do you feel safe going back to the place where you live?: Yes      Need for Family Participation in Patient Care: No (Comment) Care giver support system in place?: Yes (comment)   Criminal Activity/Legal Involvement Pertinent to Current Situation/Hospitalization: No - Comment as needed  Activities of Daily Living Home Assistive Devices/Equipment: None ADL Screening (condition at time of admission) Patient's cognitive ability adequate to safely complete daily activities?: No Is the patient deaf or have difficulty hearing?: No Does the patient have difficulty seeing, even when wearing glasses/contacts?: No Does the patient have difficulty concentrating, remembering, or making decisions?: Yes Patient able to express need for assistance with ADLs?: Yes Does the patient have difficulty dressing or bathing?: Yes Independently performs ADLs?:  No Communication: Needs assistance Is this a change from baseline?: Pre-admission baseline Dressing (OT): Needs assistance Is this a change from baseline?: Pre-admission baseline Grooming: Needs assistance Is this a change from baseline?: Pre-admission baseline Feeding: Needs assistance Is this a change from baseline?: Pre-admission baseline Bathing: Needs assistance Is this a change from baseline?: Pre-admission baseline Toileting: Needs assistance Is this a change from baseline?: Pre-admission baseline In/Out Bed: Needs assistance Is this a change from baseline?: Pre-admission baseline Walks in Home: Needs assistance Is this a change from baseline?: Pre-admission baseline Does the patient have difficulty walking or climbing stairs?: Yes Weakness of Legs: Both Weakness of Arms/Hands: None  Permission Sought/Granted Permission sought to share information with : Case Manager Permission granted to share information with : Yes, Verbal Permission Granted  Share Information with NAME: Eulogio Bear  Permission granted to share info w AGENCY: [facility]        Emotional Assessment Appearance:: Appears stated age Attitude/Demeanor/Rapport: Gracious Affect (typically observed): Accepting Orientation: : Oriented to Self, Oriented to Place, Oriented to  Time, Oriented to Situation Alcohol / Substance Use: Tobacco Use, Alcohol Use Psych Involvement: No (comment)  Admission diagnosis:  Transaminitis [R74.0] Encephalopathy [G93.40] Patient Active Problem List   Diagnosis Date Noted  . Acute encephalopathy 03/11/2019  . Influenza with respiratory manifestation 12/04/2016  . Sepsis due to undetermined organism ([place]) 12/04/2016  . Hyperbilirubinemia   . Influenza B 12/03/2016  . Hypothyroidism 12/03/2016  . Schizophrenia ([place]) 12/03/2016  .  Thrombocytopenia ([facility]) 12/03/2016  . Syncope 12/03/2016  . Acute respiratory failure with hypoxemia ([facility])  12/03/2016  . Hyponatremia 12/03/2016  . Orthostatic hypotension 12/03/2016   PCP:  Default, Provider, MD Pharmacy:   [facility] [phone]#[id number] [place] - [address] [place] [phone] Phone: [phone] Fax: [phone]     Social Determinants of Health (SDOH) Interventions    Readmission Risk Interventions No flowsheet data found.

## 2019-03-12 NOTE — Evaluation (Signed)
Clinical/Bedside Swallow Evaluation Patient Details  Name: Anthony Solis MRN: [id number] Date of Birth: May 16, 1956  Today's Date: 03/12/2019 Time: SLP Start Time (ACUTE ONLY): 1135 SLP Stop Time (ACUTE ONLY): 1150 SLP Time Calculation (min) (ACUTE ONLY): 15 min  Past Medical History:  Past Medical History:  Diagnosis Date  . Drug abuse ([place])   . GERD (gastroesophageal reflux disease)   . Hyperlipidemia   . Hypertension   . IBS (irritable bowel syndrome)   . Polysubstance abuse ([place])   . Schizophrenia, schizo-affective type ([place])   . Thyroid disease    Past Surgical History: History reviewed. No pertinent surgical history. HPI:  63 yo male awake in bed has h/o schizophrenia admitted with AMS, ? polypharmacy.  Swallow evaluation ordered.  CXR and CT head negative.  Diet at group home is carb mod.   Assessment / Plan / Recommendation Clinical Impression  Patient with negative CN exam, mildly impaired ability to follow directions.  C/O xerostomia - slightly red tinged oral cavity.  Easily passed 3 ounce water test with no indications of airway compromise and timely swallow via straw.  Slightly prolonged oral manipulation likely due to cognitive component but no oral residuals.    Pt reports he takes his medicine with applesauce or orange juice but poor historian.  Recommend carb mod with general precautions. Pt also with frequent belching and his confirms issues with GERD. SLP Visit Diagnosis: Dysphagia, unspecified (R13.10)    Aspiration Risk  Mild aspiration risk    Diet Recommendation     Liquid Administration via: Cup;Straw Medication Administration: (as tolerated with oj or applesauce per pt) Supervision: Patient able to self feed Compensations: Slow rate;Small sips/bites Postural Changes: Seated upright at 90 degrees;Remain upright for at least 30 minutes after po intake    Other  Recommendations Oral Care Recommendations: Oral care BID   Follow up Recommendations         Frequency and Duration min 1 x/week  1 week       Prognosis        Swallow Study   General Date of Onset: 03/12/19 HPI: 63 yo male awake in bed has h/o schizophrenia admitted with AMS, ? polypharmacy.  Swallow evaluation ordered.  CXR and CT head negative.  Diet at group home is carb mod. Type of Study: Bedside Swallow Evaluation Diet Prior to this Study: Regular;Thin liquids Temperature Spikes Noted: No Respiratory Status: Room air History of Recent Intubation: No Behavior/Cognition: Alert;Cooperative;Pleasant mood Oral Cavity Assessment: Within Functional Limits Oral Care Completed by SLP: No Oral Cavity - Dentition: (some missing dentition) Vision: Functional for self-feeding Self-Feeding Abilities: Able to feed self Patient Positioning: Upright in bed Baseline Vocal Quality: Low vocal intensity Volitional Cough: Strong Volitional Swallow: Unable to elicit(pt xerostomic)    Oral/Motor/Sensory Function Overall Oral Motor/Sensory Function: Within functional limits(possible minimal generalized weakness, decreased command following)   Ice Chips Ice chips: Not tested   Thin Liquid Thin Liquid: Within functional limits Presentation: Cup;Straw;Self Fed Other Comments: 3 ounce water test easily passed    Nectar Thick Nectar Thick Liquid: Not tested   Honey Thick Honey Thick Liquid: Not tested   Puree Puree: Within functional limits Presentation: Self Fed;Spoon Other Comments: mildly slow oral transiting, no residuals   Solid     Solid: Within functional limits Presentation: Self Fed Other Comments: slow but effective mastication without oral residuals      Anthony Solis 03/12/2019,12:01 PM   Anthony Salk, MS [facility] SLP Acute Rehab Services Pager [phone] Office  [phone]   

## 2019-03-13 ENCOUNTER — Inpatient Hospital Stay (HOSPITAL_COMMUNITY): Payer: Medicare Other

## 2019-03-13 DIAGNOSIS — R748 Abnormal levels of other serum enzymes: Secondary | ICD-10-CM

## 2019-03-13 DIAGNOSIS — I10 Essential (primary) hypertension: Secondary | ICD-10-CM | POA: Diagnosis present

## 2019-03-13 DIAGNOSIS — E876 Hypokalemia: Secondary | ICD-10-CM | POA: Diagnosis present

## 2019-03-13 LAB — CBC WITH DIFFERENTIAL/PLATELET
Abs Immature Granulocytes: 0.04 10*3/uL (ref 0.00–0.07)
Basophils Absolute: 0 10*3/uL (ref 0.0–0.1)
Basophils Relative: 1 %
Eosinophils Absolute: 0.2 10*3/uL (ref 0.0–0.5)
Eosinophils Relative: 4 %
HCT: 38.9 % — ABNORMAL LOW (ref 39.0–52.0)
Hemoglobin: 12.3 g/dL — ABNORMAL LOW (ref 13.0–17.0)
Immature Granulocytes: 1 %
Lymphocytes Relative: 26 %
Lymphs Abs: 1.4 10*3/uL (ref 0.7–4.0)
MCH: 30.8 pg (ref 26.0–34.0)
MCHC: 31.6 g/dL (ref 30.0–36.0)
MCV: 97.3 fL (ref 80.0–100.0)
Monocytes Absolute: 0.7 10*3/uL (ref 0.1–1.0)
Monocytes Relative: 13 %
Neutro Abs: 3.1 10*3/uL (ref 1.7–7.7)
Neutrophils Relative %: 55 %
Platelets: 167 10*3/uL (ref 150–400)
RBC: 4 MIL/uL — ABNORMAL LOW (ref 4.22–5.81)
RDW: 12.9 % (ref 11.5–15.5)
WBC: 5.5 10*3/uL (ref 4.0–10.5)
nRBC: 0 % (ref 0.0–0.2)

## 2019-03-13 LAB — BASIC METABOLIC PANEL
Anion gap: 10 (ref 5–15)
BUN: 11 mg/dL (ref 6–20)
CO2: 22 mmol/L (ref 22–32)
Calcium: 8.7 mg/dL — ABNORMAL LOW (ref 8.9–10.3)
Chloride: 107 mmol/L (ref 98–111)
Creatinine, Ser: 0.75 mg/dL (ref 0.61–1.24)
GFR calc Af Amer: >60 mL/min (ref >60)
GFR calc non Af Amer: >60 mL/min (ref >60)
Glucose, Bld: 86 mg/dL (ref 70–99)
Potassium: 3.5 mmol/L (ref 3.5–5.1)
Sodium: 139 mmol/L (ref 135–145)

## 2019-03-13 LAB — CK
Total CK: 303 U/L (ref 49–397)
Total CK: 203 U/L (ref 49–397)

## 2019-03-13 LAB — FOLATE RBC
Folate, Hemolysate: 446 ng/mL
Folate, RBC: 1285 ng/mL (ref >498)
Hematocrit: 34.7 % — ABNORMAL LOW (ref 37.5–51.0)

## 2019-03-13 LAB — HIV ANTIBODY (ROUTINE TESTING W REFLEX): HIV Screen 4th Generation wRfx: NONREACTIVE

## 2019-03-13 MED ORDER — BENZTROPINE MESYLATE 0.5 MG PO TABS
0.5000 mg | ORAL_TABLET | Freq: Two times a day (BID) | ORAL | Status: DC
  Administered 2019-03-14: 0.5 mg via ORAL
  Filled 2019-03-13 (×2): qty 1

## 2019-03-13 MED ORDER — QUETIAPINE FUMARATE 25 MG PO TABS
50.0000 mg | ORAL_TABLET | Freq: Two times a day (BID) | ORAL | Status: DC
  Administered 2019-03-14 (×2): 50 mg via ORAL
  Filled 2019-03-13 (×2): qty 2

## 2019-03-13 MED ORDER — QUETIAPINE FUMARATE 25 MG PO TABS
100.0000 mg | ORAL_TABLET | Freq: Every day | ORAL | Status: DC
  Filled 2019-03-13: qty 4

## 2019-03-13 MED ORDER — CLOZAPINE 100 MG PO TABS
200.0000 mg | ORAL_TABLET | Freq: Two times a day (BID) | ORAL | Status: DC
  Administered 2019-03-14: 200 mg via ORAL
  Filled 2019-03-13 (×3): qty 2

## 2019-03-13 NOTE — Evaluation (Signed)
Physical Therapy Evaluation Patient Details Name: Anthony Solis MRN: [id number] DOB: May 09, 1956 Today's Date: 03/13/2019   History of Present Illness  63 year old man from [place] admitted for AMS lasting 4 weeks.  PMH:  HTN, schizophrenia, Hep C and polysubstance abuse  Clinical Impression   Pt presents with generalized weakness, impaired cognition unsure of pt baseline, difficulty performing mobility tasks, decreased safety awareness, and decreased activity tolerance. Pt to benefit from acute PT to address deficits. Pt ambulated hallway distance with IV pole and HHA for steadying, pt states at baseline he does not use AD but unsure of the accuracy of this. PT recommending return to group home upon d/c from hospital, no follow up PT needs at this time. PT to progress mobility as tolerated, and will continue to follow acutely.      Follow Up Recommendations Other (comment)(d/c to group home)    Equipment Recommendations  Other (comment)(unsure of what equipment pt has access to)    Recommendations for Other Services       Precautions / Restrictions Precautions Precautions: Fall Restrictions Weight Bearing Restrictions: No      Mobility  Bed Mobility Overal bed mobility: Needs Assistance Bed Mobility: Supine to Sit     Supine to sit: Mod assist;HOB elevated     General bed mobility comments: mod assist for trunk elevation, LE management, scooting to EOB with use of bed pad.  Transfers Overall transfer level: Needs assistance Equipment used: Rolling walker (2 wheeled) Transfers: Sit to/from Stand Sit to Stand: Min guard;From elevated surface         General transfer comment: Min guard for safety, increased time to rise with reaching towards IV pole for steadying.  Ambulation/Gait Ambulation/Gait assistance: Min guard Gait Distance (Feet): 150 Feet Assistive device: IV Pole Gait Pattern/deviations: Step-through pattern;Decreased stride length;Trunk  flexed Gait velocity: WFL   General Gait Details: Min guard safety with assist for IV pole navigation, pt lacking safety awareness with IV pole due to distractability in the hallway.  Stairs            Wheelchair Mobility    Modified Rankin (Stroke Patients Only)       Balance                                             Pertinent Vitals/Pain Pain Assessment: Faces Faces Pain Scale: Hurts a little bit Pain Location: penis Pain Descriptors / Indicators: Discomfort Pain Intervention(s): Limited activity within patient's tolerance    Home Living Family/patient expects to be discharged to:: Other (Comment)                 Additional Comments: from Group Home    Prior Function Level of Independence: Needs assistance   Gait / Transfers Assistance Needed: Pt reports not using AD for ambulation, states "I didn't walk much"  ADL's / Homemaking Assistance Needed: Per TOC notes, pt needed assist with ADLs  Comments: =     Hand Dominance   Dominant Hand: Right    Extremity/Trunk Assessment   Upper Extremity Assessment Upper Extremity Assessment: Overall WFL for tasks assessed    Lower Extremity Assessment Lower Extremity Assessment: Difficult to assess due to impaired cognition;Generalized weakness(able to perform AAROM hip/knee flexion/extension)    Cervical / Trunk Assessment Cervical / Trunk Assessment: Normal  Communication   Communication: (very soft spoken)  Cognition Arousal/Alertness:  Awake/alert Behavior During Therapy: WFL for tasks assessed/performed Overall Cognitive Status: No family/caregiver present to determine baseline cognitive functioning Area of Impairment: Orientation;Following commands;Attention;Safety/judgement                 Orientation Level: Disoriented to;Situation Current Attention Level: Selective   Following Commands: Follows one step commands consistently;Follows one step commands with increased  time Safety/Judgement: Decreased awareness of safety     General Comments: Pt unable to state birthday, when asked pt began stating a series of unrelated numbers. Pt very easily distracted by visual stimuli, especially in hallway.      General Comments      Exercises     Assessment/Plan    PT Assessment Patient needs continued PT services  PT Problem List Decreased strength;Decreased mobility;Decreased range of motion;Decreased activity tolerance;Decreased balance;Decreased knowledge of use of DME;Pain;Decreased safety awareness       PT Treatment Interventions DME instruction;Therapeutic activities;Gait training;Therapeutic exercise;Patient/family education;Balance training;Functional mobility training    PT Goals (Current goals can be found in the Care Plan section)  Acute Rehab PT Goals Patient Stated Goal: none stated PT Goal Formulation: With patient Time For Goal Achievement: 03/27/19 Potential to Achieve Goals: Good    Frequency Min 3X/week   Barriers to discharge        Co-evaluation               AM-PAC PT "6 Clicks" Mobility  Outcome Measure Help needed turning from your back to your side while in a flat bed without using bedrails?: A Lot Help needed moving from lying on your back to sitting on the side of a flat bed without using bedrails?: A Lot Help needed moving to and from a bed to a chair (including a wheelchair)?: A Little Help needed standing up from a chair using your arms (e.g., wheelchair or bedside chair)?: A Little Help needed to walk in hospital room?: A Little Help needed climbing 3-5 steps with a railing? : A Little 6 Click Score: 16    End of Session   Activity Tolerance: Patient tolerated treatment well Patient left: in chair;with chair alarm set;with call bell/phone within reach Nurse Communication: Mobility status PT Visit Diagnosis: Other abnormalities of gait and mobility (R26.89);Difficulty in walking, not elsewhere classified  (R26.2)    Time: 1525-1540 PT Time Calculation (min) (ACUTE ONLY): 15 min   Charges:   PT Evaluation $PT Eval Low Complexity: 1 Low        Caiden Monsivais Terrial Rhodes Venesha Petraitis, PT Acute Rehabilitation Services Pager [phone]  Office [phone]   Johanan Skorupski D Belynda Pagaduan 03/13/2019, 5:11 PM

## 2019-03-13 NOTE — Progress Notes (Addendum)
PROGRESS NOTE    Anthony Solis  ZOX:[id number] DOB: 1956/06/08 DOA: 03/11/2019 PCP: Default, Provider, MD   Brief Narrative:  Anthony AugustaBenjamin Hargroveis a 62 y.o.malewith medical history significant ofGERD, hypertension, hyperlipidemia, schizophrenia, hypothyroidism, IBS, h/o Hepatitis C, [facility]. On my exam, he was more alert, told me his name and date of birth, couldn't tell me the place or time. He reports pain in the right eye and in the left leg. He denies any nausea, vomiting or abd pain. He denies fever or chills. He appears to have a dry cough. No sob or chest pain. Patient reports pain in the right eye of unclear duration. He is referred to medical service for admission for AMS.    Assessment & Plan:   Active Problems:   Acute encephalopathy   Encephalopathy acute versus chronic.  Called patient's living facility to get further information they were unable to provide much meaningful history.  restart cogentin and antipsychotic today.  Gently hydrating, urine culture with insignificant growth, blood cultures negative, white blood cell count normal, UA negative for signs of infection UDS negative.  We will continue to hydrate monitor additional 24 hours, try to obtain more information from family members and staff facility where he stays. CT head neg, MRI pending  Leukocytosis Resolved no evidence of infection  Hypokalemia 3.5 Replace as needed.  Hypertension Well-controlled  Mildly elevated liver enzymes Possibly secondary to statins and fenofibrate. We will hold these medications, Recheck in the morning after patient's been appropriately rehydrated . Elevated CK. Trending down, monitor renal function, increase hydration from 75 mils per hour  Right eye pain with slight erythemaof the conjunctiva: It was noted patient had corner of mask in his eye on admission by evaluation Denies any  eye pain on my eval today No evidence of infection  DVT prophylaxis: Lovenox SQ  Code Status: Full    Code Status Orders  (From admission, onward)         Start     Ordered   03/11/19 2149  Full code  Continuous     03/11/19 2148        Code Status History    Date Active Date Inactive Code Status Order ID Comments User Context   12/03/2016 1313 12/05/2016 1954 Full Code [id number]  Alba Coryegalado, Belkys A, MD Inpatient   Advance Care Planning Activity     Family Communication: none today, trying to reach family  Disposition Plan:    Patient will remain an additional day for continued hydration, monitoring of abnormalities with labs to include CK, possible further radiographic evaluation and possible subspecialties consultation.  Without these treatments patient at risk of severe clinical deterioration Consults called: None Admission status: Inpatient   Consultants:   None  Procedures:  Ct Head Wo Contrast  Result Date: 03/11/2019 CLINICAL DATA:  Altered mental status and incontinent EXAM: CT HEAD WITHOUT CONTRAST TECHNIQUE: Contiguous axial images were obtained from the base of the skull through the vertex without intravenous contrast. COMPARISON:  12/03/2016 FINDINGS: Brain: No evidence of acute infarction, hemorrhage, hydrocephalus, extra-axial collection or mass lesion/mass effect. Vascular: No hyperdense vessel or unexpected calcification. Skull: Normal. Negative for fracture or focal lesion. Sinuses/Orbits: No acute finding. Other: None. IMPRESSION: Normal head CT Electronically Signed   By: Alcide CleverMark  Lukens M.D.   On: 03/11/2019 15:33   Dg Chest Portable 1 View  Result Date: 03/11/2019 CLINICAL DATA:  Altered mental status. EXAM: PORTABLE CHEST 1 VIEW  COMPARISON:  Chest x-rays dated 12/03/2016 and 06/23/2010. FINDINGS: Heart size and mediastinal contours are within normal limits. Lungs are clear. No pleural effusions seen. Osseous structures about the chest are unremarkable.  IMPRESSION: No active disease.  No evidence of pneumonia or pulmonary edema. Electronically Signed   By: Bary Richard M.D.   On: 03/11/2019 12:26   Vas [place] Lower Extremity Venous (dvt)  Result Date: 03/12/2019  Lower Venous Study Indications: Pain, and Edema.  Comparison Study: No previous exam available for comparison Performing Technologist: Toma Deiters RVS  Examination Guidelines: A complete evaluation includes B-mode imaging, spectral Doppler, color Doppler, and power Doppler as needed of all accessible portions of each vessel. Bilateral testing is considered an integral part of a complete examination. Limited examinations for reoccurring indications may be performed as noted.  +-----+---------------+---------+-----------+----------+-------+  RIGHT Compressibility Phasicity Spontaneity Properties Summary  +-----+---------------+---------+-----------+----------+-------+  CFV   Full            Yes       Yes                             +-----+---------------+---------+-----------+----------+-------+  SFJ   Full                                                      +-----+---------------+---------+-----------+----------+-------+   +---------+---------------+---------+-----------+----------+-------+  LEFT      Compressibility Phasicity Spontaneity Properties Summary  +---------+---------------+---------+-----------+----------+-------+  CFV       Full            Yes       Yes                             +---------+---------------+---------+-----------+----------+-------+  SFJ       Full                                                      +---------+---------------+---------+-----------+----------+-------+  FV Prox   Full            Yes       Yes                             +---------+---------------+---------+-----------+----------+-------+  FV Mid    Full                                                      +---------+---------------+---------+-----------+----------+-------+  FV Distal Full            Yes        Yes                             +---------+---------------+---------+-----------+----------+-------+  PFV       Full            Yes       Yes                             +---------+---------------+---------+-----------+----------+-------+  POP       Full            Yes       Yes                             +---------+---------------+---------+-----------+----------+-------+  PTV       Full                                                      +---------+---------------+---------+-----------+----------+-------+  PERO      Full                                                      +---------+---------------+---------+-----------+----------+-------+     Summary: Right: There is no evidence of a common femoral vein obstruction Left: There is no evidence of deep vein thrombosis in the lower extremity. No cystic structure found in the popliteal fossa.  *See table(s) above for measurements and observations. Electronically signed by Gretta Beganodd Early MD on 03/12/2019 at 6:11:39 PM.    Final      Antimicrobials:   none    Subjective: Reported to be somewhat lethargic this morning by my evaluation answering questions appropriately, Reported being tired, being in moderate pain,   Objective: Vitals:   03/12/19 0518 03/12/19 1507 03/12/19 2055 03/13/19 0556  BP: 124/87 122/90 138/80 125/79  Pulse: 86 88 84 75  Resp: 20 18 16 16   Temp: (!) 97.5 F (36.4 C) 98.9 F (37.2 C) 98 F (36.7 C) 98.2 F (36.8 C)  TempSrc: Oral Axillary Oral Oral  SpO2: 97% 100% 99% 98%  Weight:      Height:        Intake/Output Summary (Last 24 hours) at 03/13/2019 1349 Last data filed at 03/13/2019 0934 Gross per 24 hour  Intake 3516.67 ml  Output 1400 ml  Net 2116.67 ml   Filed Weights   03/11/19 2203  Weight: 70.5 kg    Examination:  General exam: Appears calm and comfortable, flat affect Respiratory system: Clear to auscultation. Respiratory effort normal. Cardiovascular system: S1 & S2 heard, RRR. No JVD,  murmurs, rubs, gallops or clicks. No pedal edema. Gastrointestinal system: Abdomen is nondistended, soft and nontender. No organomegaly or masses felt. Normal bowel sounds heard. Central nervous system: Alert,. No focal neurological deficits. No leg weakness noted, despite "leg heaviness" in EMR Extremities: Symmetric 5 x 5 STRENGTH, no edema. Skin: No rashes, lesions or ulcers Psychiatry: Judgement and insight impairwed. Mood & affect flat    Data Reviewed: I have personally reviewed following labs and imaging studies  CBC: Recent Labs  Lab 03/11/19 1219 03/12/19 0051 03/12/19 0101 03/13/19 0538  WBC 15.4* 8.8  --  5.5  NEUTROABS  --  5.7  --  3.1  HGB 13.0 11.9*  --  12.3*  HCT 39.2 37.3* 34.7* 38.9*  MCV 95.8 97.1  --  97.3  PLT 171 162  --  167   Basic Metabolic Panel: Recent Labs  Lab 03/11/19 1219 03/11/19 1456 03/12/19 0101 03/13/19 0538  NA 142  --  142 139  K 3.1*  --  3.2* 3.5  CL 104  --  108 107  CO2 26  --  25 22  GLUCOSE 97  --  78 86  BUN 14  --  13 11  CREATININE 0.92  --  0.73 0.75  CALCIUM 9.3  --  8.9 8.7*  MG  --  2.0 2.0  --   PHOS  --   --  3.2  --    GFR: Estimated Creatinine Clearance: 97.9 mL/min (by C-G formula based on SCr of 0.75 mg/dL). Liver Function Tests: Recent Labs  Lab 03/11/19 1219  AST 132*  ALT 111*  ALKPHOS 33*  BILITOT 1.2  PROT 6.6  ALBUMIN 3.9   No results for input(s): LIPASE, AMYLASE in the last 168 hours. Recent Labs  Lab 03/11/19 1236  AMMONIA 11   Coagulation Profile: No results for input(s): INR, PROTIME in the last 168 hours. Cardiac Enzymes: Recent Labs  Lab 03/12/19 0101 03/12/19 1520 03/13/19 0538  CKTOTAL 936* 511* 303   BNP (last 3 results) No results for input(s): PROBNP in the last 8760 hours. HbA1C: No results for input(s): HGBA1C in the last 72 hours. CBG: Recent Labs  Lab 03/11/19 1218  GLUCAP 92   Lipid Profile: No results for input(s): CHOL, HDL, LDLCALC, TRIG, CHOLHDL,  LDLDIRECT in the last 72 hours. Thyroid Function Tests: Recent Labs    03/12/19 0101  TSH 1.263   Anemia Panel: Recent Labs    03/12/19 0101  VITAMINB12 1,061*   Sepsis Labs: Recent Labs  Lab 03/11/19 1456 03/11/19 1457 03/12/19 0101  LATICACIDVEN 2.4* 1.5 1.5    Recent Results (from the past 240 hour(s))  Blood culture (routine x 2)     Status: None (Preliminary result)   Collection Time: 03/11/19 12:22 PM   Specimen: BLOOD  Result Value Ref Range Status   Specimen Description   Final    BLOOD LEFT ANTECUBITAL Performed at [facility], [place] [address], [place], [place] [address]    Special Requests   Final    BOTTLES DRAWN AEROBIC AND ANAEROBIC Blood Culture adequate volume Performed at [facility] [address] [place], [place] [address]    Culture   Final    NO GROWTH 2 DAYS Performed at [facility] [address] [place], [place] [address]    Report Status PENDING  Incomplete  Urine culture     Status: Abnormal   Collection Time: 03/11/19 12:23 PM   Specimen: Urine, Random  Result Value Ref Range Status   Specimen Description   Final    URINE, RANDOM Performed at [facility], [place] [address] [place], [place] [address]    Special Requests   Final    NONE Performed at [facility], [place] [address], [place], [place] [address]    Culture (A)  Final    <10,000 COLONIES/mL INSIGNIFICANT GROWTH Performed at [facility] [address], [place],  [address]    Report Status 03/12/2019 FINAL  Final  SARS Coronavirus 2 (CEPHEID - Performed in [facility] hospital lab), Hosp Order     Status: None   Collection Time: 03/11/19  1:20 PM   Specimen: Nasopharyngeal Swab  Result Value Ref Range Status   SARS Coronavirus 2 NEGATIVE NEGATIVE Final    Comment: (NOTE) If result is NEGATIVE SARS-CoV-2 target nucleic acids are NOT DETECTED. The SARS-CoV-2 RNA is generally detectable in upper and  lower  respiratory specimens during the acute phase of  infection. The lowest  concentration of SARS-CoV-2 viral copies this assay can detect is 250  copies / mL. A negative result does not preclude SARS-CoV-2 infection  and should not be used as the sole basis for treatment or other  patient management decisions.  A negative result may occur with  improper specimen collection / handling, submission of specimen other  than nasopharyngeal swab, presence of viral mutation(s) within the  areas targeted by this assay, and inadequate number of viral copies  (<250 copies / mL). A negative result must be combined with clinical  observations, patient history, and epidemiological information. If result is POSITIVE SARS-CoV-2 target nucleic acids are DETECTED. The SARS-CoV-2 RNA is generally detectable in upper and lower  respiratory specimens dur ing the acute phase of infection.  Positive  results are indicative of active infection with SARS-CoV-2.  Clinical  correlation with patient history and other diagnostic information is  necessary to determine patient infection status.  Positive results do  not rule out bacterial infection or co-infection with other viruses. If result is PRESUMPTIVE POSTIVE SARS-CoV-2 nucleic acids MAY BE PRESENT.   A presumptive positive result was obtained on the submitted specimen  and confirmed on repeat testing.  While 2019 novel coronavirus  (SARS-CoV-2) nucleic acids may be present in the submitted sample  additional confirmatory testing may be necessary for epidemiological  and / or clinical management purposes  to differentiate between  SARS-CoV-2 and other Sarbecovirus currently known to infect humans.  If clinically indicated additional testing with an alternate test  methodology [phone]) is advised. The SARS-CoV-2 RNA is generally  detectable in upper and lower respiratory sp ecimens during the acute  phase of infection. The expected result is  Negative. Fact Sheet for Patients:  BoilerBrush.com.cy Fact Sheet for Healthcare Providers: https://pope.com/ This test is not yet approved or cleared by the [place] FDA and has been authorized for detection and/or diagnosis of SARS-CoV-2 by FDA under an Emergency Use Authorization (EUA).  This EUA will remain in effect (meaning this test can be used) for the duration of the COVID-19 declaration under Section 564(b)(1) of the Act, 21 U.S.C. section 360bbb-3(b)(1), unless the authorization is terminated or revoked sooner. Performed at [facility], [address] [place], [place] [address]   Blood culture (routine x 2)     Status: None (Preliminary result)   Collection Time: 03/11/19  1:20 PM   Specimen: BLOOD  Result Value Ref Range Status   Specimen Description   Final    BLOOD RIGHT ANTECUBITAL Performed at [facility], [address] [place], [place] [address]    Special Requests   Final    BOTTLES DRAWN AEROBIC AND ANAEROBIC Blood Culture adequate volume Performed at [facility], [address] [place], [place] [address]    Culture   Final    NO GROWTH 2 DAYS Performed at [facility], [address] [place], [place] [address]    Report Status PENDING  Incomplete  MRSA PCR Screening     Status: None   Collection Time: 03/12/19 11:27 AM   Specimen: Nasopharyngeal  Result Value Ref Range Status   MRSA by PCR NEGATIVE NEGATIVE Final    Comment:        The GeneXpert MRSA Assay (FDA approved for NASAL specimens only), is one component of a comprehensive MRSA colonization surveillance program. It is not intended to diagnose MRSA infection nor to guide or monitor treatment for MRSA infections.  Performed at [facility], [address] [place], [place] [id number]          Radiology Studies: Ct Head Wo Contrast  Result Date:  03/11/2019 CLINICAL DATA:  Altered mental status and incontinent EXAM: CT HEAD WITHOUT CONTRAST TECHNIQUE: Contiguous axial images were obtained from the base of the skull through the vertex without intravenous contrast. COMPARISON:  12/03/2016 FINDINGS: Brain: No evidence of acute infarction, hemorrhage, hydrocephalus, extra-axial collection or mass lesion/mass effect. Vascular: No hyperdense vessel or unexpected calcification. Skull: Normal. Negative for fracture or focal lesion. Sinuses/Orbits: No acute finding. Other: None. IMPRESSION: Normal head CT Electronically Signed   By: Alcide CleverMark  Lukens M.D.   On: 03/11/2019 15:33   Vas [place] Lower Extremity Venous (dvt)  Result Date: 03/12/2019  Lower Venous Study Indications: Pain, and Edema.  Comparison Study: No previous exam available for comparison Performing Technologist: Toma DeitersVirginia Slaughter RVS  Examination Guidelines: A complete evaluation includes B-mode imaging, spectral Doppler, color Doppler, and power Doppler as needed of all accessible portions of each vessel. Bilateral testing is considered an integral part of a complete examination. Limited examinations for reoccurring indications may be performed as noted.  +-----+---------------+---------+-----------+----------+-------+  RIGHT Compressibility Phasicity Spontaneity Properties Summary  +-----+---------------+---------+-----------+----------+-------+  CFV   Full            Yes       Yes                             +-----+---------------+---------+-----------+----------+-------+  SFJ   Full                                                      +-----+---------------+---------+-----------+----------+-------+   +---------+---------------+---------+-----------+----------+-------+  LEFT      Compressibility Phasicity Spontaneity Properties Summary  +---------+---------------+---------+-----------+----------+-------+  CFV       Full            Yes       Yes                              +---------+---------------+---------+-----------+----------+-------+  SFJ       Full                                                      +---------+---------------+---------+-----------+----------+-------+  FV Prox   Full            Yes       Yes                             +---------+---------------+---------+-----------+----------+-------+  FV Mid    Full                                                      +---------+---------------+---------+-----------+----------+-------+  FV Distal Full            Yes       Yes                             +---------+---------------+---------+-----------+----------+-------+  PFV       Full            Yes       Yes                             +---------+---------------+---------+-----------+----------+-------+  POP       Full            Yes       Yes                             +---------+---------------+---------+-----------+----------+-------+  PTV       Full                                                      +---------+---------------+---------+-----------+----------+-------+  PERO      Full                                                      +---------+---------------+---------+-----------+----------+-------+     Summary: Right: There is no evidence of a common femoral vein obstruction Left: There is no evidence of deep vein thrombosis in the lower extremity. No cystic structure found in the popliteal fossa.  *See table(s) above for measurements and observations. Electronically signed by Gretta Began MD on 03/12/2019 at 6:11:39 PM.    Final         Scheduled Meds:  cloZAPine  100 mg Oral Daily   enoxaparin (LOVENOX) injection  40 mg Subcutaneous Q24H   levothyroxine  50 mcg Oral Daily   Continuous Infusions:  sodium chloride 100 mL/hr at 03/12/19 1745     LOS: 1 day    Time spent: 35 min     Burke Keels, MD [facility]  If 7PM-7AM, please contact night-coverage  03/13/2019, 1:49 PM

## 2019-03-14 LAB — CBC WITH DIFFERENTIAL/PLATELET
Abs Immature Granulocytes: 0.03 10*3/uL (ref 0.00–0.07)
Basophils Absolute: 0 10*3/uL (ref 0.0–0.1)
Basophils Relative: 1 %
Eosinophils Absolute: 0.3 10*3/uL (ref 0.0–0.5)
Eosinophils Relative: 5 %
HCT: 36.1 % — ABNORMAL LOW (ref 39.0–52.0)
Hemoglobin: 11.7 g/dL — ABNORMAL LOW (ref 13.0–17.0)
Immature Granulocytes: 1 %
Lymphocytes Relative: 28 %
Lymphs Abs: 1.5 10*3/uL (ref 0.7–4.0)
MCH: 31.3 pg (ref 26.0–34.0)
MCHC: 32.4 g/dL (ref 30.0–36.0)
MCV: 96.5 fL (ref 80.0–100.0)
Monocytes Absolute: 0.7 10*3/uL (ref 0.1–1.0)
Monocytes Relative: 14 %
Neutro Abs: 2.8 10*3/uL (ref 1.7–7.7)
Neutrophils Relative %: 51 %
Platelets: 181 10*3/uL (ref 150–400)
RBC: 3.74 MIL/uL — ABNORMAL LOW (ref 4.22–5.81)
RDW: 12.8 % (ref 11.5–15.5)
WBC: 5.3 10*3/uL (ref 4.0–10.5)
nRBC: 0 % (ref 0.0–0.2)

## 2019-03-14 LAB — BASIC METABOLIC PANEL
Anion gap: 9 (ref 5–15)
BUN: 9 mg/dL (ref 6–20)
CO2: 21 mmol/L — ABNORMAL LOW (ref 22–32)
Calcium: 8.7 mg/dL — ABNORMAL LOW (ref 8.9–10.3)
Chloride: 108 mmol/L (ref 98–111)
Creatinine, Ser: 0.66 mg/dL (ref 0.61–1.24)
GFR calc Af Amer: >60 mL/min (ref >60)
GFR calc non Af Amer: >60 mL/min (ref >60)
Glucose, Bld: 92 mg/dL (ref 70–99)
Potassium: 3.5 mmol/L (ref 3.5–5.1)
Sodium: 138 mmol/L (ref 135–145)

## 2019-03-14 LAB — CK: Total CK: 181 U/L (ref 49–397)

## 2019-03-14 NOTE — Discharge Summary (Signed)
Physician Discharge Summary  Jiles GarterBenjamin Halk UJW:[id number] DOB: 03-28-1956 DOA: 03/11/2019  PCP: Default, Provider, MD  Admit date: 03/11/2019 Discharge date: 03/14/2019  Admitted From: Inpatient Disposition: group home  Recommendations for Outpatient Follow-up:  1. Follow up with PCP in 1-2 weeks   Home Health:No Equipment/Devices:none  Discharge Condition:Stable CODE STATUS:Full code Diet recommendation: Regular healthy diet  Brief/Interim Summary: Anthony Solis a 63 y.o.malewith medical history significant ofGERD, hypertension, hyperlipidemia, schizophrenia, hypothyroidism, IBS, h/o Hepatitis C, [facility] 4 weeks [facility]. On my exam, he was more alert, told me his name and date of birth, couldn't tell me the place or time. He reports pain in the right eye and in the left leg. He denies any nausea, vomiting or abd pain. He denies fever or chills. He appears to have a dry cough. No sob or chest pain. Patient reports pain in the right eye of unclear duration. He is referred to medical service for admission for AMS.   Hospital course:  Encephalopathy acute versus chronic. Called patient's living facility to get further information but unfortunately they were unable to provide much additional history. restarted cogentin and home regimen for antipsychotics l today. Gently hydrated while in the hospital, urine culture with insignificant growth, blood cultures negative, white blood cell count normal, UA negative for signs of infection UDS negative, CT head was negative, MRI was negative. Monitored the patient an additional 24 hours and he appears to be at baseline.  Leukocytosis Resolved no evidence of infection  Hypokalemia 3.5 Replace as needed.  Hypertension Well-controlled  Mildly elevated liver enzymes Patient will need close follow-up for CMP and liver function tests. Continue his home  antihyperlipidemics.  . Elevated CK. Trended down, monitored renal function, BUN and creatinine of 9 and 0.66 on the day of discharge  Right eye pain with slight erythemaof the conjunctiva: It was noted patient had corner of mask in his eye on admission by evaluation Denies any eye pain on my eval today No evidence of infection  Discharge Diagnoses:  Active Problems:   Encephalopathy   Hypokalemia   HTN (hypertension)   Elevated CK   Elevated liver enzymes    Discharge Instructions  Discharge Instructions    Call MD for:  difficulty breathing, headache or visual disturbances   Complete by: As directed    Call MD for:  hives   Complete by: As directed    Call MD for:  persistant dizziness or light-headedness   Complete by: As directed    Call MD for:  persistant nausea and vomiting   Complete by: As directed    Call MD for:  redness, tenderness, or signs of infection (pain, swelling, redness, odor or green/yellow discharge around incision site)   Complete by: As directed    Call MD for:  severe uncontrolled pain   Complete by: As directed    Call MD for:  temperature >100.4   Complete by: As directed    Diet - low sodium heart healthy   Complete by: As directed    Increase activity slowly   Complete by: As directed      Allergies as of 03/14/2019   No Known Allergies     Medication List    TAKE these medications   benztropine 0.5 MG tablet Commonly known as: COGENTIN Take 0.5 mg by mouth 2 (two) times daily.   cholecalciferol 10 MCG (400 UNIT) Tabs tablet Commonly known as: VITAMIN D3 Take 400 Units  by mouth daily.   cloZAPine 100 MG tablet Commonly known as: CLOZARIL Take 200 mg by mouth 2 (two) times daily.   dicyclomine 20 MG tablet Commonly known as: BENTYL Take 20 mg by mouth 3 (three) times daily.   docusate sodium 100 MG capsule Commonly known as: COLACE Take 100 mg by mouth daily.   Fenoglide 120 MG Tabs Generic drug: Fenofibrate Take  120 mg by mouth daily.   levothyroxine 50 MCG tablet Commonly known as: SYNTHROID Take 50 mcg by mouth daily.   mirtazapine 30 MG tablet Commonly known as: REMERON Take 30 mg by mouth at bedtime.   multivitamin tablet Take 1 tablet by mouth daily.   QUEtiapine 50 MG tablet Commonly known as: SEROQUEL Take 50-100 mg by mouth See admin instructions. Take 50 mg every morning, 50 mg at midday and 100 mg at bedtime.   sennosides-docusate sodium 8.6-50 MG tablet Commonly known as: SENOKOT-S Take 2 tablets by mouth daily. What changed: how much to take   simvastatin 20 MG tablet Commonly known as: ZOCOR Take 20 mg by mouth every evening.       No Known Allergies  Consultations:  None   Procedures/Studies: Ct Head Wo Contrast  Result Date: 03/11/2019 CLINICAL DATA:  Altered mental status and incontinent EXAM: CT HEAD WITHOUT CONTRAST TECHNIQUE: Contiguous axial images were obtained from the base of the skull through the vertex without intravenous contrast. COMPARISON:  12/03/2016 FINDINGS: Brain: No evidence of acute infarction, hemorrhage, hydrocephalus, extra-axial collection or mass lesion/mass effect. Vascular: No hyperdense vessel or unexpected calcification. Skull: Normal. Negative for fracture or focal lesion. Sinuses/Orbits: No acute finding. Other: None. IMPRESSION: Normal head CT Electronically Signed   By: Alcide Clever M.D.   On: 03/11/2019 15:33   Mr Brain Wo Contrast  Result Date: 03/13/2019 CLINICAL DATA:  Initial evaluation for acute altered mental status. EXAM: MRI HEAD WITHOUT CONTRAST TECHNIQUE: Multiplanar, multiecho pulse sequences of the brain and surrounding structures were obtained without intravenous contrast. COMPARISON:  Prior CT from 03/11/2019. FINDINGS: Brain: Cerebral volume within normal limits for age. Few scattered subcentimeter T2/FLAIR hyperintensities noted within the periventricular deep white matter both cerebral hemispheres, nonspecific, but  felt to be within normal limits for age. No abnormal foci of restricted diffusion to suggest acute or subacute ischemia. Gray-white matter differentiation maintained. No areas of remote cortical infarction. No foci of susceptibility artifact to suggest acute or chronic intracranial hemorrhage. No mass lesion, midline shift or mass effect. No hydrocephalus. No extra-axial fluid collection. Pituitary gland suprasellar region within normal limits. Midline structures intact. Vascular: Major intracranial vascular flow voids are maintained. Skull and upper cervical spine: Craniocervical junction within normal limits. Upper cervical spine normal. Bone marrow signal intensity within normal limits. No scalp soft tissue abnormality. Sinuses/Orbits: Globes and orbital soft tissues within normal limits. Mild scattered mucosal thickening throughout the ethmoidal air cells. Paranasal sinuses are otherwise clear. No mastoid effusion. Inner ear structures normal. Other: None. IMPRESSION: Normal brain MRI for age. No acute intracranial abnormality identified. Electronically Signed   By: Rise Mu M.D.   On: 03/13/2019 21:24   Dg Chest Portable 1 View  Result Date: 03/11/2019 CLINICAL DATA:  Altered mental status. EXAM: PORTABLE CHEST 1 VIEW COMPARISON:  Chest x-rays dated 12/03/2016 and 06/23/2010. FINDINGS: Heart size and mediastinal contours are within normal limits. Lungs are clear. No pleural effusions seen. Osseous structures about the chest are unremarkable. IMPRESSION: No active disease.  No evidence of pneumonia or pulmonary edema. Electronically Signed  By: Bary Richard M.D.   On: 03/11/2019 12:26   Vas [place] Lower Extremity Venous (dvt)  Result Date: 03/12/2019  Lower Venous Study Indications: Pain, and Edema.  Comparison Study: No previous exam available for comparison Performing Technologist: Toma Deiters RVS  Examination Guidelines: A complete evaluation includes B-mode imaging, spectral Doppler,  color Doppler, and power Doppler as needed of all accessible portions of each vessel. Bilateral testing is considered an integral part of a complete examination. Limited examinations for reoccurring indications may be performed as noted.  +-----+---------------+---------+-----------+----------+-------+ RIGHTCompressibilityPhasicitySpontaneityPropertiesSummary +-----+---------------+---------+-----------+----------+-------+ CFV  Full           Yes      Yes                          +-----+---------------+---------+-----------+----------+-------+ SFJ  Full                                                 +-----+---------------+---------+-----------+----------+-------+   +---------+---------------+---------+-----------+----------+-------+ LEFT     CompressibilityPhasicitySpontaneityPropertiesSummary +---------+---------------+---------+-----------+----------+-------+ CFV      Full           Yes      Yes                          +---------+---------------+---------+-----------+----------+-------+ SFJ      Full                                                 +---------+---------------+---------+-----------+----------+-------+ FV Prox  Full           Yes      Yes                          +---------+---------------+---------+-----------+----------+-------+ FV Mid   Full                                                 +---------+---------------+---------+-----------+----------+-------+ FV DistalFull           Yes      Yes                          +---------+---------------+---------+-----------+----------+-------+ PFV      Full           Yes      Yes                          +---------+---------------+---------+-----------+----------+-------+ POP      Full           Yes      Yes                          +---------+---------------+---------+-----------+----------+-------+ PTV      Full                                                  +---------+---------------+---------+-----------+----------+-------+  PERO     Full                                                 +---------+---------------+---------+-----------+----------+-------+     Summary: Right: There is no evidence of a common femoral vein obstruction Left: There is no evidence of deep vein thrombosis in the lower extremity. No cystic structure found in the popliteal fossa.  *See table(s) above for measurements and observations. Electronically signed by Gretta Beganodd Early MD on 03/12/2019 at 6:11:39 PM.    Final        Subjective: Patient appears to be at baseline answering questions appropriately.  Discharge Exam: Vitals:   03/13/19 2128 03/14/19 0419  BP: (!) 141/93 132/76  Pulse: 84 78  Resp: 16 18  Temp: 98.3 F (36.8 C) 98.5 F (36.9 C)  SpO2: 99% 95%   Vitals:   03/13/19 0556 03/13/19 1426 03/13/19 2128 03/14/19 0419  BP: 125/79 (!) 152/84 (!) 141/93 132/76  Pulse: 75 90 84 78  Resp: 16 14 16 18   Temp: 98.2 F (36.8 C) 99.1 F (37.3 C) 98.3 F (36.8 C) 98.5 F (36.9 C)  TempSrc: Oral Oral Oral Oral  SpO2: 98% 98% 99% 95%  Weight:      Height:        General: Pt is alert, awake, not in acute distress Cardiovascular: RRR, S1/S2 +, no rubs, no gallops Respiratory: CTA bilaterally, no wheezing, no rhonchi Abdominal: Soft, NT, ND, bowel sounds + Extremities: no edema, no cyanosis    The results of significant diagnostics from this hospitalization (including imaging, microbiology, ancillary and laboratory) are listed below for reference.     Microbiology: Recent Results (from the past 240 hour(s))  Blood culture (routine x 2)     Status: None (Preliminary result)   Collection Time: 03/11/19 12:22 PM   Specimen: BLOOD  Result Value Ref Range Status   Specimen Description   Final    BLOOD LEFT ANTECUBITAL Performed at [facility], [address] [place], [place] [address]    Special Requests   Final    BOTTLES DRAWN AEROBIC AND  ANAEROBIC Blood Culture adequate volume Performed at [facility], [address] [place], [place] [address]    Culture   Final    NO GROWTH 2 DAYS Performed at [facility], [address] [place], [place] [address]    Report Status PENDING  Incomplete  Urine culture     Status: Abnormal   Collection Time: 03/11/19 12:23 PM   Specimen: Urine, Random  Result Value Ref Range Status   Specimen Description   Final    URINE, RANDOM Performed at [facility], [address] [place], [place] [address]    Special Requests   Final    NONE Performed at [facility], [address] [place], [place] [address]    Culture (A)  Final    <10,000 COLONIES/mL INSIGNIFICANT GROWTH Performed at [facility], [address] [place], [place] [address]    Report Status 03/12/2019 FINAL  Final  SARS Coronavirus 2 (CEPHEID - Performed in [facility] hospital lab), Hosp Order     Status: None   Collection Time: 03/11/19  1:20 PM   Specimen: Nasopharyngeal Swab  Result Value Ref Range Status   SARS Coronavirus 2  NEGATIVE NEGATIVE Final    Comment: (NOTE) If result is NEGATIVE SARS-CoV-2 target nucleic acids are NOT DETECTED. The SARS-CoV-2 RNA is generally detectable in upper and lower  respiratory specimens during the acute phase of infection. The lowest  concentration of SARS-CoV-2 viral copies this assay can detect is 250  copies / mL. A negative result does not preclude SARS-CoV-2 infection  and should not be used as the sole basis for treatment or other  patient management decisions.  A negative result may occur with  improper specimen collection / handling, submission of specimen other  than nasopharyngeal swab, presence of viral mutation(s) within the  areas targeted by this assay, and inadequate number of viral copies  (<250 copies / mL). A negative result must be combined with clinical  observations, patient history, and  epidemiological information. If result is POSITIVE SARS-CoV-2 target nucleic acids are DETECTED. The SARS-CoV-2 RNA is generally detectable in upper and lower  respiratory specimens dur ing the acute phase of infection.  Positive  results are indicative of active infection with SARS-CoV-2.  Clinical  correlation with patient history and other diagnostic information is  necessary to determine patient infection status.  Positive results do  not rule out bacterial infection or co-infection with other viruses. If result is PRESUMPTIVE POSTIVE SARS-CoV-2 nucleic acids MAY BE PRESENT.   A presumptive positive result was obtained on the submitted specimen  and confirmed on repeat testing.  While 2019 novel coronavirus  (SARS-CoV-2) nucleic acids may be present in the submitted sample  additional confirmatory testing may be necessary for epidemiological  and / or clinical management purposes  to differentiate between  SARS-CoV-2 and other Sarbecovirus currently known to infect humans.  If clinically indicated additional testing with an alternate test  methodology [phone]([id number]) is advised. The SARS-CoV-2 RNA is generally  detectable in upper and lower respiratory sp ecimens during the acute  phase of infection. The expected result is Negative. Fact Sheet for Patients:  BoilerBrush.com.cyhttps://www.fda.gov/media/136312/download Fact Sheet for Healthcare Providers: https://pope.com/https://www.fda.gov/media/136313/download This test is not yet approved or cleared by the [place] FDA and has been authorized for detection and/or diagnosis of SARS-CoV-2 by FDA under an Emergency Use Authorization (EUA).  This EUA will remain in effect (meaning this test can be used) for the duration of the COVID-19 declaration under Section 564(b)(1) of the Act, 21 U.S.C. section 360bbb-3(b)(1), unless the authorization is terminated or revoked sooner. Performed at [facility], [address] [place], [place] [address]    Blood culture (routine x 2)     Status: None (Preliminary result)   Collection Time: 03/11/19  1:20 PM   Specimen: BLOOD  Result Value Ref Range Status   Specimen Description   Final    BLOOD RIGHT ANTECUBITAL Performed at [facility], [address] [place], [place] [address]    Special Requests   Final    BOTTLES DRAWN AEROBIC AND ANAEROBIC Blood Culture adequate volume Performed at [facility], [address] [place], [place] [address]    Culture   Final    NO GROWTH 2 DAYS Performed at [facility], [address] [place], [place] [address]    Report Status PENDING  Incomplete  MRSA PCR Screening     Status: None   Collection Time: 03/12/19 11:27 AM   Specimen: Nasopharyngeal  Result Value Ref Range Status   MRSA by PCR NEGATIVE NEGATIVE Final    Comment:  The GeneXpert MRSA Assay (FDA approved for NASAL specimens only), is one component of a comprehensive MRSA colonization surveillance program. It is not intended to diagnose MRSA infection nor to guide or monitor treatment for MRSA infections. Performed at [facility], [place] [address] [place], [place] [address]      Labs: BNP (last 3 results) Recent Labs    03/11/19 1247  BNP 23.5   Basic Metabolic Panel: Recent Labs  Lab 03/11/19 1219 03/11/19 1456 03/12/19 0101 03/13/19 0538 03/14/19 0520  NA 142  --  142 139 138  K 3.1*  --  3.2* 3.5 3.5  CL 104  --  108 107 108  CO2 26  --  25 22 21*  GLUCOSE 97  --  78 86 92  BUN 14  --  13 11 9   CREATININE 0.92  --  0.73 0.75 0.66  CALCIUM 9.3  --  8.9 8.7* 8.7*  MG  --  2.0 2.0  --   --   PHOS  --   --  3.2  --   --    Liver Function Tests: Recent Labs  Lab 03/11/19 1219  AST 132*  ALT 111*  ALKPHOS 33*  BILITOT 1.2  PROT 6.6  ALBUMIN 3.9   No results for input(s): LIPASE, AMYLASE in the last 168 hours. Recent Labs  Lab 03/11/19 1236  AMMONIA 11   CBC: Recent Labs   Lab 03/11/19 1219 03/12/19 0051 03/12/19 0101 03/13/19 0538 03/14/19 0520  WBC 15.4* 8.8  --  5.5 5.3  NEUTROABS  --  5.7  --  3.1 2.8  HGB 13.0 11.9*  --  12.3* 11.7*  HCT 39.2 37.3* 34.7* 38.9* 36.1*  MCV 95.8 97.1  --  97.3 96.5  PLT 171 162  --  167 181   Cardiac Enzymes: Recent Labs  Lab 03/12/19 0101 03/12/19 1520 03/13/19 0538 03/13/19 1842 03/14/19 0520  CKTOTAL 936* 511* 303 203 181   BNP: Invalid input(s): POCBNP CBG: Recent Labs  Lab 03/11/19 1218  GLUCAP 92   D-Dimer No results for input(s): DDIMER in the last 72 hours. Hgb A1c No results for input(s): HGBA1C in the last 72 hours. Lipid Profile No results for input(s): CHOL, HDL, LDLCALC, TRIG, CHOLHDL, LDLDIRECT in the last 72 hours. Thyroid function studies Recent Labs    03/12/19 0101  TSH 1.263   Anemia work up Recent Labs    03/12/19 0101  VITAMINB12 1,061*   Urinalysis    Component Value Date/Time   COLORURINE YELLOW 03/11/2019 [place] 03/11/2019 1223   LABSPEC 1.009 03/11/2019 1223   PHURINE 6.0 03/11/2019 1223   GLUCOSEU NEGATIVE 03/11/2019 1223   HGBUR NEGATIVE 03/11/2019 1223   [place] 03/11/2019 1223   [place] 03/11/2019 1223   PROTEINUR NEGATIVE 03/11/2019 1223   UROBILINOGEN 1.0 08/21/2012 0456   NITRITE NEGATIVE 03/11/2019 1223   LEUKOCYTESUR NEGATIVE 03/11/2019 1223   Sepsis Labs Invalid input(s): PROCALCITONIN,  WBC,  LACTICIDVEN Microbiology Recent Results (from the past 240 hour(s))  Blood culture (routine x 2)     Status: None (Preliminary result)   Collection Time: 03/11/19 12:22 PM   Specimen: BLOOD  Result Value Ref Range Status   Specimen Description   Final    BLOOD LEFT ANTECUBITAL Performed at [facility], [place] [address], [place], [place] [address]    Special Requests   Final    BOTTLES DRAWN AEROBIC AND ANAEROBIC Blood Culture adequate volume  Performed at [facility], [address] [place], [place] [address]    Culture   Final    NO GROWTH 2 DAYS Performed at [facility], [address], [place], [place] [address]    Report Status PENDING  Incomplete  Urine culture     Status: Abnormal   Collection Time: 03/11/19 12:23 PM   Specimen: Urine, Random  Result Value Ref Range Status   Specimen Description   Final    URINE, RANDOM Performed at [facility], [address], [place], [place] [address]    Special Requests   Final    NONE Performed at [facility], [address] [place], [place] [address]    Culture (A)  Final    <10,000 COLONIES/mL INSIGNIFICANT GROWTH Performed at [facility], [address] [place], [place] [address]    Report Status 03/12/2019 FINAL  Final  SARS Coronavirus 2 (CEPHEID - Performed in [facility]), Hosp Order     Status: None   Collection Time: 03/11/19  1:20 PM   Specimen: Nasopharyngeal Swab  Result Value Ref Range Status   SARS Coronavirus 2 NEGATIVE NEGATIVE Final    Comment: (NOTE) If result is NEGATIVE SARS-CoV-2 target nucleic acids are NOT DETECTED. The SARS-CoV-2 RNA is generally detectable in upper and lower  respiratory specimens during the acute phase of infection. The lowest  concentration of SARS-CoV-2 viral copies this assay can detect is 250  copies / mL. A negative result does not preclude SARS-CoV-2 infection  and should not be used as the sole basis for treatment or other  patient management decisions.  A negative result may occur with  improper specimen collection / handling, submission of specimen other  than nasopharyngeal swab, presence of viral mutation(s) within the  areas targeted by this assay, and inadequate number of viral copies  (<250 copies / mL). A negative result must be combined with clinical  observations, patient history, and epidemiological information. If result is POSITIVE SARS-CoV-2 target nucleic acids are DETECTED. The  SARS-CoV-2 RNA is generally detectable in upper and lower  respiratory specimens dur ing the acute phase of infection.  Positive  results are indicative of active infection with SARS-CoV-2.  Clinical  correlation with patient history and other diagnostic information is  necessary to determine patient infection status.  Positive results do  not rule out bacterial infection or co-infection with other viruses. If result is PRESUMPTIVE POSTIVE SARS-CoV-2 nucleic acids MAY BE PRESENT.   A presumptive positive result was obtained on the submitted specimen  and confirmed on repeat testing.  While 2019 novel coronavirus  (SARS-CoV-2) nucleic acids may be present in the submitted sample  additional confirmatory testing may be necessary for epidemiological  and / or clinical management purposes  to differentiate between  SARS-CoV-2 and other Sarbecovirus currently known to infect humans.  If clinically indicated additional testing with an alternate test  methodology [phone]) is advised. The SARS-CoV-2 RNA is generally  detectable in upper and lower respiratory sp ecimens during the acute  phase of infection. The expected result is Negative. Fact Sheet for Patients:  BoilerBrush.com.cy Fact Sheet for Healthcare Providers: https://pope.com/ This test is not yet approved or cleared by the [place] FDA and has been authorized for detection and/or diagnosis of SARS-CoV-2 by FDA under an Emergency Use Authorization (EUA).  This EUA will remain in effect (meaning this test can be used) for the duration of the  COVID-19 declaration under Section 564(b)(1) of the Act, 21 U.S.C. section 360bbb-3(b)(1), unless the authorization is terminated or revoked sooner. Performed at [facility], [address], [place], [place] [address]   Blood culture (routine x 2)     Status: None (Preliminary result)   Collection Time: 03/11/19  1:20 PM    Specimen: BLOOD  Result Value Ref Range Status   Specimen Description   Final    BLOOD RIGHT ANTECUBITAL Performed at [facility], [address], [place], [place] [address]    Special Requests   Final    BOTTLES DRAWN AEROBIC AND ANAEROBIC Blood Culture adequate volume Performed at [facility], [address], [place], [place] [address]    Culture   Final    NO GROWTH 2 DAYS Performed at [facility], [address] [place], [place] [address]    Report Status PENDING  Incomplete  MRSA PCR Screening     Status: None   Collection Time: 03/12/19 11:27 AM   Specimen: Nasopharyngeal  Result Value Ref Range Status   MRSA by PCR NEGATIVE NEGATIVE Final    Comment:        The GeneXpert MRSA Assay (FDA approved for NASAL specimens only), is one component of a comprehensive MRSA colonization surveillance program. It is not intended to diagnose MRSA infection nor to guide or monitor treatment for MRSA infections. Performed at [facility], [address], [place], [place] [address]      Time coordinating discharge: Over 30 minutes  SIGNED:   Burke Keels, MD  [facility] 03/14/2019, 10:19 AM Pager   If 7PM-7AM, please contact night-coverage [email or web address] Password [email or web address]

## 2019-03-14 NOTE — Care Management Important Message (Signed)
Important Message  Patient Details IM Letter given to Dessa Phi RN to present to the Patient Name: Anthony Solis MRN: [id number] Date of Birth: [birth year]   Medicare Important Message Given:  Yes     Kerin Salen 03/14/2019, 10:15 AM
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
## 2019-03-14 NOTE — Progress Notes (Signed)
Patient will be discharged to group home, report call to Denna Haggard Orthoptist). Patient is waiting to be transported via [place]

## 2019-03-16 LAB — CULTURE, BLOOD (ROUTINE X 2)
Culture: NO GROWTH
Culture: NO GROWTH
Special Requests: ADEQUATE
Special Requests: ADEQUATE

## 2019-05-13 ENCOUNTER — Encounter: Payer: Medicare Other | Admitting: Infectious Diseases

## 2019-05-13 ENCOUNTER — Telehealth: Payer: Self-pay | Admitting: Pharmacy Technician

## 2019-05-13 NOTE — Telephone Encounter (Signed)
[facility] Patient Advocate Encounter    Findings of the benefits investigation conducted this morning via test claims for the patient's upcoming appointment on 09/22 are as follows:   Insurance: Network engineer- active  Prior Authorization: will begin insurance process once medication is prescribed  [facility] Patient Advocate will follow up once patient arrives for their appointment to confirm demographics and household size/income.  Bartholomew Crews, CPhT [facility] Phone: [phone] Fax: [phone] 05/13/2019 8:55 AM

## 2019-05-27 ENCOUNTER — Other Ambulatory Visit: Payer: Self-pay

## 2019-05-27 ENCOUNTER — Encounter: Payer: Self-pay | Admitting: Infectious Diseases

## 2019-05-27 ENCOUNTER — Ambulatory Visit (INDEPENDENT_AMBULATORY_CARE_PROVIDER_SITE_OTHER): Payer: Medicare Other | Admitting: Infectious Diseases

## 2019-05-27 VITALS — BP 110/87 | HR 94 | Temp 98.3°F

## 2019-05-27 DIAGNOSIS — Z23 Encounter for immunization: Secondary | ICD-10-CM | POA: Diagnosis not present

## 2019-05-27 DIAGNOSIS — D696 Thrombocytopenia, unspecified: Secondary | ICD-10-CM

## 2019-05-27 DIAGNOSIS — B182 Chronic viral hepatitis C: Secondary | ICD-10-CM

## 2019-05-27 DIAGNOSIS — F200 Paranoid schizophrenia: Secondary | ICD-10-CM

## 2019-05-27 NOTE — Patient Instructions (Signed)
Stop drinking wine and stop smoking cigarettes. Return in 6 weeks to review results of lab work.
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
## 2019-05-30 LAB — CBC WITH DIFFERENTIAL/PLATELET
Absolute Monocytes: 572 cells/uL (ref 200–950)
Basophils Absolute: 53 cells/uL (ref 0–200)
Basophils Relative: 0.9 %
Eosinophils Absolute: 431 cells/uL (ref 15–500)
Eosinophils Relative: 7.3 %
HCT: 44.9 % (ref 38.5–50.0)
Hemoglobin: 15 g/dL (ref 13.2–17.1)
Lymphs Abs: 1640 cells/uL (ref 850–3900)
MCH: 30.7 pg (ref 27.0–33.0)
MCHC: 33.4 g/dL (ref 32.0–36.0)
MCV: 91.8 fL (ref 80.0–100.0)
MPV: 12 fL (ref 7.5–12.5)
Monocytes Relative: 9.7 %
Neutro Abs: 3204 cells/uL (ref 1500–7800)
Neutrophils Relative %: 54.3 %
Platelets: 168 10*3/uL (ref 140–400)
RBC: 4.89 10*6/uL (ref 4.20–5.80)
RDW: 13 % (ref 11.0–15.0)
Total Lymphocyte: 27.8 %
WBC: 5.9 10*3/uL (ref 3.8–10.8)

## 2019-05-30 LAB — HEPATITIS C RNA QUANTITATIVE
HCV Quantitative Log: 6.12 Log IU/mL — ABNORMAL HIGH
HCV RNA, PCR, QN: 1330000 IU/mL — ABNORMAL HIGH

## 2019-05-30 LAB — HEPATITIS C GENOTYPE

## 2019-05-30 LAB — HEPATITIS B SURFACE ANTIGEN: Hepatitis B Surface Ag: NONREACTIVE

## 2019-07-07 ENCOUNTER — Telehealth: Payer: Self-pay | Admitting: Pharmacy Technician

## 2019-07-07 NOTE — Telephone Encounter (Signed)
[facility] Patient Advocate Encounter    Findings of the benefits investigation conducted this morning via test claims for the patient's upcoming appointment are as follows:  Insurance: Network engineer- active  Prior Authorization: will begin insurance process once medication is prescribed  [facility] Patient Advocate will follow up once patient arrives for their appointment to confirm demographics and household size/income.  Anthony Solis. [name] Patient [facility] for Infectious Disease Phone: [phone] Fax:  [phone]

## 2019-07-08 ENCOUNTER — Encounter: Payer: Self-pay | Admitting: Infectious Diseases

## 2019-07-08 ENCOUNTER — Other Ambulatory Visit: Payer: Self-pay

## 2019-07-08 ENCOUNTER — Ambulatory Visit (INDEPENDENT_AMBULATORY_CARE_PROVIDER_SITE_OTHER): Payer: Medicare Other | Admitting: Infectious Diseases

## 2019-07-08 VITALS — BP 129/85 | HR 94 | Temp 98.3°F | Wt 169.0 lb

## 2019-07-08 DIAGNOSIS — F200 Paranoid schizophrenia: Secondary | ICD-10-CM

## 2019-07-08 DIAGNOSIS — Z23 Encounter for immunization: Secondary | ICD-10-CM

## 2019-07-08 DIAGNOSIS — B182 Chronic viral hepatitis C: Secondary | ICD-10-CM

## 2019-07-08 NOTE — Patient Instructions (Signed)
Have FibroScan performed to determine degree of liver scarring you have. Will see you back in clinic in 6 weeks to review results and discuss treatment option for hepatitis C.
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
## 2019-07-16 ENCOUNTER — Other Ambulatory Visit: Payer: Medicare Other

## 2019-07-22 ENCOUNTER — Other Ambulatory Visit: Payer: Medicare Other

## 2019-07-29 ENCOUNTER — Other Ambulatory Visit: Payer: Medicare Other

## 2019-08-04 ENCOUNTER — Telehealth: Payer: Self-pay | Admitting: Family

## 2019-08-04 DIAGNOSIS — B182 Chronic viral hepatitis C: Secondary | ICD-10-CM

## 2019-08-04 NOTE — Telephone Encounter (Signed)
Anthony Solis has Genotype 1b Hepatitis C with viral load of 1.33 million. He is in need of additional blood work to complete the evaluation to obtain medication. Please have him schedule a lab visit to complete the blood work if he remains interested in treatment.

## 2019-08-05 NOTE — Telephone Encounter (Signed)
Called patient to try to schedule a Lab appointment but line sounds busy, will try again later.

## 2019-08-05 NOTE — Telephone Encounter (Signed)
[facility] Patient Advocate Encounter  Called the number listed in Epic for the patient [phone] and it routed to [place]. The lady answering the phone got the  the patient on the phone and when I tried to verify his date of birth before preceding, he said that information could be found in the chart and he didn't want to say it out loud. I asked if now was a good time or if he preferred later and he said there was not a good time. He confirmed that calling the enrichment center was the best way to get in touch with him. I left it with him to call us back and schedule an appointment when he was ready.

## 2019-08-20 ENCOUNTER — Ambulatory Visit: Payer: Medicare Other | Admitting: Infectious Diseases

## 2019-08-25 ENCOUNTER — Ambulatory Visit: Payer: Medicare Other | Admitting: Family

## 2019-08-26 ENCOUNTER — Telehealth: Payer: Self-pay

## 2019-08-26 NOTE — Telephone Encounter (Signed)
Attempted to call patient with phone number listed in epic regarding missed appointment with Anthony Eke, NP. Voiceover stated "this call can not be completed as dialed." No additional phone number listed in patient's chart. Anthony Solis

## 2020-02-08 ENCOUNTER — Other Ambulatory Visit: Payer: Self-pay

## 2020-02-08 ENCOUNTER — Encounter (HOSPITAL_COMMUNITY): Payer: Self-pay | Admitting: Emergency Medicine

## 2020-02-08 ENCOUNTER — Emergency Department (HOSPITAL_COMMUNITY)
Admission: EM | Admit: 2020-02-08 | Discharge: 2020-02-13 | Disposition: A | Discharge status: Other Acute Inpatient Hospital | Payer: Medicare Other | Attending: Emergency Medicine | Admitting: Emergency Medicine

## 2020-02-08 DIAGNOSIS — E039 Hypothyroidism, unspecified: Secondary | ICD-10-CM | POA: Diagnosis not present

## 2020-02-08 DIAGNOSIS — Z20822 Contact with and (suspected) exposure to covid-19: Secondary | ICD-10-CM | POA: Diagnosis not present

## 2020-02-08 DIAGNOSIS — I10 Essential (primary) hypertension: Secondary | ICD-10-CM | POA: Insufficient documentation

## 2020-02-08 DIAGNOSIS — Z79899 Other long term (current) drug therapy: Secondary | ICD-10-CM | POA: Insufficient documentation

## 2020-02-08 DIAGNOSIS — F309 Manic episode, unspecified: Secondary | ICD-10-CM | POA: Diagnosis not present

## 2020-02-08 DIAGNOSIS — F259 Schizoaffective disorder, unspecified: Secondary | ICD-10-CM | POA: Insufficient documentation

## 2020-02-08 DIAGNOSIS — F17210 Nicotine dependence, cigarettes, uncomplicated: Secondary | ICD-10-CM | POA: Diagnosis not present

## 2020-02-08 DIAGNOSIS — F918 Other conduct disorders: Secondary | ICD-10-CM | POA: Diagnosis present

## 2020-02-08 LAB — COMPREHENSIVE METABOLIC PANEL
ALT: 90 U/L — ABNORMAL HIGH (ref 0–44)
AST: 79 U/L — ABNORMAL HIGH (ref 15–41)
Albumin: 4.2 g/dL (ref 3.5–5.0)
Alkaline Phosphatase: 44 U/L (ref 38–126)
Anion gap: 10 (ref 5–15)
BUN: 10 mg/dL (ref 8–23)
CO2: 22 mmol/L (ref 22–32)
Calcium: 9.6 mg/dL (ref 8.9–10.3)
Chloride: 108 mmol/L (ref 98–111)
Creatinine, Ser: 1.34 mg/dL — ABNORMAL HIGH (ref 0.61–1.24)
GFR calc Af Amer: >60 mL/min (ref >60)
GFR calc non Af Amer: 57 mL/min — ABNORMAL LOW (ref >60)
Glucose, Bld: 92 mg/dL (ref 70–99)
Potassium: 4.1 mmol/L (ref 3.5–5.1)
Sodium: 140 mmol/L (ref 135–145)
Total Bilirubin: 0.7 mg/dL (ref 0.3–1.2)
Total Protein: 6.5 g/dL (ref 6.5–8.1)

## 2020-02-08 LAB — SARS CORONAVIRUS 2 BY RT PCR (HOSPITAL ORDER, PERFORMED IN ~~LOC~~ HOSPITAL LAB): SARS Coronavirus 2: NEGATIVE

## 2020-02-08 LAB — SALICYLATE LEVEL: Salicylate Lvl: <7 mg/dL — ABNORMAL LOW (ref 7.0–30.0)

## 2020-02-08 LAB — CBC
HCT: 44.9 % (ref 39.0–52.0)
Hemoglobin: 14.5 g/dL (ref 13.0–17.0)
MCH: 30.8 pg (ref 26.0–34.0)
MCHC: 32.3 g/dL (ref 30.0–36.0)
MCV: 95.3 fL (ref 80.0–100.0)
Platelets: 158 10*3/uL (ref 150–400)
RBC: 4.71 MIL/uL (ref 4.22–5.81)
RDW: 13.4 % (ref 11.5–15.5)
WBC: 6.9 10*3/uL (ref 4.0–10.5)
nRBC: 0 % (ref 0.0–0.2)

## 2020-02-08 LAB — ETHANOL: Alcohol, Ethyl (B): <10 mg/dL (ref <10)

## 2020-02-08 LAB — AMMONIA: Ammonia: 30 umol/L (ref 9–35)

## 2020-02-08 LAB — ACETAMINOPHEN LEVEL: Acetaminophen (Tylenol), Serum: <10 ug/mL — ABNORMAL LOW (ref 10–30)

## 2020-02-08 MED ORDER — FENOFIBRATE 48 MG PO TABS
48.0000 mg | ORAL_TABLET | Freq: Every day | ORAL | Status: DC
  Filled 2020-02-08: qty 1

## 2020-02-08 MED ORDER — HALOPERIDOL LACTATE 5 MG/ML IJ SOLN
5.0000 mg | Freq: Three times a day (TID) | INTRAMUSCULAR | Status: DC | PRN
  Administered 2020-02-09: 5 mg via INTRAMUSCULAR
  Filled 2020-02-08: qty 1

## 2020-02-08 MED ORDER — FENOFIBRATE 54 MG PO TABS
54.0000 mg | ORAL_TABLET | Freq: Every day | ORAL | Status: DC
  Administered 2020-02-09 – 2020-02-13 (×4): 54 mg via ORAL
  Filled 2020-02-08 (×6): qty 1

## 2020-02-08 MED ORDER — DOCUSATE SODIUM 100 MG PO CAPS
100.0000 mg | ORAL_CAPSULE | Freq: Every day | ORAL | Status: DC
  Administered 2020-02-11 – 2020-02-13 (×3): 100 mg via ORAL
  Filled 2020-02-08 (×5): qty 1

## 2020-02-08 MED ORDER — IBUPROFEN 400 MG PO TABS
600.0000 mg | ORAL_TABLET | Freq: Three times a day (TID) | ORAL | Status: DC | PRN
  Administered 2020-02-09: 600 mg via ORAL
  Filled 2020-02-08: qty 1

## 2020-02-08 MED ORDER — HALOPERIDOL LACTATE 5 MG/ML IJ SOLN
5.0000 mg | Freq: Once | INTRAMUSCULAR | Status: AC
  Administered 2020-02-08: 5 mg via INTRAMUSCULAR
  Filled 2020-02-08: qty 1

## 2020-02-08 MED ORDER — QUETIAPINE FUMARATE 50 MG PO TABS
50.0000 mg | ORAL_TABLET | Freq: Two times a day (BID) | ORAL | Status: DC
  Administered 2020-02-09 – 2020-02-13 (×7): 50 mg via ORAL
  Filled 2020-02-08 (×9): qty 1

## 2020-02-08 MED ORDER — BENZTROPINE MESYLATE 1 MG PO TABS
0.5000 mg | ORAL_TABLET | Freq: Two times a day (BID) | ORAL | Status: DC
  Administered 2020-02-09 – 2020-02-13 (×7): 0.5 mg via ORAL
  Filled 2020-02-08 (×9): qty 1

## 2020-02-08 MED ORDER — QUETIAPINE FUMARATE 200 MG PO TABS
200.0000 mg | ORAL_TABLET | Freq: Every day | ORAL | Status: DC
  Administered 2020-02-10 – 2020-02-11 (×2): 200 mg via ORAL
  Filled 2020-02-08 (×4): qty 1

## 2020-02-08 MED ORDER — CLOZAPINE 100 MG PO TABS
200.0000 mg | ORAL_TABLET | Freq: Two times a day (BID) | ORAL | Status: DC
  Administered 2020-02-10 – 2020-02-13 (×4): 200 mg via ORAL
  Filled 2020-02-08 (×13): qty 2

## 2020-02-08 MED ORDER — DIPHENHYDRAMINE HCL 50 MG/ML IJ SOLN
50.0000 mg | Freq: Three times a day (TID) | INTRAMUSCULAR | Status: DC | PRN
  Administered 2020-02-09: 50 mg via INTRAMUSCULAR
  Filled 2020-02-08: qty 1

## 2020-02-08 MED ORDER — DICYCLOMINE HCL 20 MG PO TABS
20.0000 mg | ORAL_TABLET | Freq: Three times a day (TID) | ORAL | Status: DC
  Administered 2020-02-09 – 2020-02-13 (×8): 20 mg via ORAL
  Filled 2020-02-08 (×11): qty 1

## 2020-02-08 MED ORDER — LEVOTHYROXINE SODIUM 50 MCG PO TABS
50.0000 ug | ORAL_TABLET | Freq: Every day | ORAL | Status: DC
  Administered 2020-02-11 – 2020-02-13 (×3): 50 ug via ORAL
  Filled 2020-02-08 (×4): qty 1

## 2020-02-08 MED ORDER — MIRTAZAPINE 15 MG PO TABS
30.0000 mg | ORAL_TABLET | Freq: Every day | ORAL | Status: DC
  Administered 2020-02-10 – 2020-02-11 (×2): 30 mg via ORAL
  Filled 2020-02-08 (×3): qty 2
  Filled 2020-02-08: qty 1

## 2020-02-08 MED ORDER — SIMVASTATIN 20 MG PO TABS
20.0000 mg | ORAL_TABLET | Freq: Every evening | ORAL | Status: DC
  Administered 2020-02-10 – 2020-02-12 (×3): 20 mg via ORAL
  Filled 2020-02-08 (×4): qty 1

## 2020-02-08 NOTE — ED Notes (Signed)
Patient refused any meds from staff states he does not trust anything that was not ordered by "his" MD-Monique,RN

## 2020-02-08 NOTE — ED Notes (Signed)
Called staffing, no sitter available at this time. °

## 2020-02-08 NOTE — Progress Notes (Signed)
Per [place] with [facility] this patient has been added to their waitlist.    Ruthann Cancer MSW, [employer] Clincal Social Worker  [facility]

## 2020-02-08 NOTE — ED Notes (Signed)
Pt not wanting to stay in room.  Coming out and sitting in chairs.  Other pt's sitter trying to redirect pt.
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
## 2020-02-08 NOTE — ED Notes (Signed)
Pt refused all medications 

## 2020-02-08 NOTE — Progress Notes (Signed)
Patient meets criteria for inpatient treatment per Berneice Heinrich, NP. No appropriate beds at [facility] currently. CSW faxed referrals to the following facilities for review:  [facility]   [facility]   [facility]   [facility]   [facility]   [facility]   [facility]   [facility]   [facility]   [facility]      TTS will continue to seek bed placement.     Ruthann Cancer MSW, [employer] Clincal Social Worker Disposition  [facility] 02/08/2020 11:23 AM

## 2020-02-08 NOTE — ED Notes (Signed)
Dinner tray ordered for pt

## 2020-02-08 NOTE — ED Notes (Signed)
Belongings placed in locker 2.  

## 2020-02-08 NOTE — ED Notes (Signed)
Pt refusing to go back into room.  Advised pt that he could not stay in chair out of room.
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
## 2020-02-08 NOTE — ED Notes (Signed)
Pt attempting to walk around unit.  Pt encouraged to go back to room.  Pt refuses.  Security and [employer] advising pt that he needed to stay in his room.  Pt standing in doorway of his room.  Pt continues to refuse his medications

## 2020-02-08 NOTE — BH Assessment (Signed)
Behavioral Health Disposition:  Per Berneice Heinrich, NP, patient is recommended for inpatient psychiatric care

## 2020-02-08 NOTE — ED Notes (Signed)
Patient urinated in cups in room; pt was advised where to bathroom is but refused to walk to the bathroom; pt has been pacing back and forth in the room and occasionally popping up outside the door-Monique,RN
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
## 2020-02-08 NOTE — ED Notes (Signed)
Pt moved to purple zone with [employer] for TTS

## 2020-02-09 DIAGNOSIS — F259 Schizoaffective disorder, unspecified: Secondary | ICD-10-CM | POA: Diagnosis not present

## 2020-02-09 NOTE — ED Notes (Signed)
Pt pressed code blue button, RN went into room, pt was pacing in room. RN asked pt if everything was ok, pt started nonsensical talking and sat back on his bed.

## 2020-02-09 NOTE — ED Notes (Signed)
RN offered pt seroquel, pt has fleeting thoughts, nonsensical talking.

## 2020-02-09 NOTE — ED Notes (Signed)
Patient is now resting in the bed watching TV-Monique,RN

## 2020-02-09 NOTE — ED Notes (Signed)
This RN spoke with Anthony Solis from [facility], she stated that pt was approved for this facility, however pt's medicare did not have enough psych days left to cover the cost of his stay and pt would have to pay out of pocket for inpatient treatment.

## 2020-02-09 NOTE — ED Notes (Signed)
Pt's lunch ordered 

## 2020-02-09 NOTE — ED Provider Notes (Signed)
Emergency Medicine Observation Re-evaluation Note  Anthony Solis is a 64 y.o. male, seen on rounds today.  Pt initially presented to the ED for complaints of Manic Behavior and Aggressive Behavior Currently, the patient is speaking to himself, unwilling to speak to me.  Physical Exam  BP 122/65 (BP Location: Left Arm)   Pulse 67   Temp (!) 97.4 F (36.3 C) (Oral)   Resp 19   SpO2 99%  Physical Exam Sitting comfortably on his bed, eating.  No acute distress. ED Course / MDM  EKG:    I have reviewed the labs performed to date as well as medications administered while in observation.  Recent changes in the last 24 hours include evaluated by TTS, they recommend inpatient psych treatment. Plan  Current plan is for awaiting placement to facility. Patient is under full IVC at this time.   Dietrich Pates, PA-C 02/09/20 1130    Tegeler, Canary Brim, MD 02/09/20 [phone]

## 2020-02-09 NOTE — BH Assessment (Signed)
[facility] Assessment Progress Note This Probation officer met with patient this date to evaluate current mental health status. Patient is observed to have toilet paper in his ears and nose. As soon as this Probation officer entered the room the patient started talking nonsensically and started to make flapping motions with his arms. Patient could not be redirected. Case was staffed with Dwyane Dee MD who recommended a continued inpatient admission. CSW Ruthann Cancer was informed and is assisting with placement.

## 2020-02-09 NOTE — ED Notes (Signed)
Pt concerned about his blood pressure and heart, wants to make sure it is pumping. NT checked pt's vitals and reassured him that they were normal. Pt asked this RN to check his heart, RN felt pt's radial pulse and told him his heart was working. Pt stated his back was bleeding and that this RN needed to call 911 and activate an emergency because he was dying. RN explained that his back was not bleeding and he was already in the emergency room so we didn't need to call 911. Pt agreeable, cooperative, and redirectable but is repeating questions about his BP and heart.

## 2020-02-09 NOTE — ED Notes (Addendum)
Patient pulled code button twice in an hour; pt was advised not to touch code button; patient continues to pace in his room-Monique,RN

## 2020-02-09 NOTE — ED Notes (Signed)
Pt showered. This RN was informed by sitter that pt attempted to drink the body wash. RN informed Dr Rodena Medin. Pt got himself dressed and back into his room, calm and cooperative, no complaints.

## 2020-02-09 NOTE — ED Notes (Signed)
Pt told Tanya NT that he did want his pain pill. RN gave ibuprofen and seroquel.

## 2020-02-09 NOTE — ED Notes (Signed)
Lunch Tray Ordered @ 1050. 

## 2020-02-09 NOTE — ED Notes (Signed)
Patient remains in the hallway talking to himself; pt has flight of ideas and keeps referencing pain; Patient asked to go in room but will only stay in room for a few minutes and right back out into hallway; Patient had minor words with another patient regarding him not being quite and in the hallway; Staff was able to defuse the situation at this time but patient remains sitting in a chair in the hallway; pt is not aggressive just manic and paranoid-Monique,RN

## 2020-02-09 NOTE — ED Notes (Signed)
Pt states he is hungry, RN suggested he eat his breakfast tray. Pt continutes to pace room, w/ nonsensical speech. Asked Tanya NT for coffee and apple juice and a pain pill. RN offered ibuprofen, pt ignored comment. Tanya NT asked pt if he wanted the pain pill, pt stated he did not. Tanya NT gave pt decaf coffee and apple juice.

## 2020-02-09 NOTE — ED Notes (Signed)
Patient became escalated and refused to return to room; [employer] attempted to talk to patient and security called; pt standing in hallway and would not move his feet; RN gave PRN IM medications to help to calm him; Security had to manually assist patient to his room after medication administered for his safety-Monique,RN

## 2020-02-10 DIAGNOSIS — F203 Undifferentiated schizophrenia: Secondary | ICD-10-CM | POA: Diagnosis not present

## 2020-02-10 LAB — RAPID URINE DRUG SCREEN, HOSP PERFORMED
Amphetamines: NOT DETECTED
Barbiturates: NOT DETECTED
Benzodiazepines: NOT DETECTED
Cocaine: NOT DETECTED
Opiates: NOT DETECTED
Tetrahydrocannabinol: NOT DETECTED

## 2020-02-10 NOTE — ED Provider Notes (Signed)
Emergency Medicine Observation Re-evaluation Note  Anthony Solis is a 64 y.o. male, seen on rounds today.  Pt initially presented to the ED for complaints of Manic Behavior and Aggressive Behavior Currently, the patient is standing in his room watching television.  Per nursing staff, patient has refused p.o. medication since arrival.  Patient continues to deny any need for medications while I speak with him.  Patient does not provide clear answers to questions but does appear calm and is not confrontational when I speak to him.  He asks on multiple occasions to "go outside to smoke".  Physical Exam  BP 111/75 (BP Location: Right Arm)   Pulse 62   Temp 98.7 F (37.1 C) (Oral)   Resp 18   SpO2 98%  Physical Exam Standing in his room, comfortably, watching television.  No acute distress noted. ED Course / MDM  EKG:    I have reviewed the labs performed to date as well as medications administered while in observation.  Recent changes in the last 24 hours include continued effort to find inpatient placement for patient. Plan  Current plan is for placement to facility. Patient is under full IVC at this time.   Placido Sou, PA-C 02/10/20 1157    Arby Barrette, MD 02/11/20 1335

## 2020-02-10 NOTE — ED Notes (Signed)
Pt refusing medication admin at this. Flight of Ideas and intangible speech is present but pt does not appear in any distress and is resting comfortably watching TV. This RN will reassess pts willingness to accept medication admin. At a later time.

## 2020-02-10 NOTE — ED Notes (Signed)
Lunch Tray Ordered @ 1048. °

## 2020-02-10 NOTE — ED Notes (Signed)
Pt still not agreeable to medication administration despite education.

## 2020-02-10 NOTE — ED Notes (Signed)
Pt continuing to refuse medications after multiple attempts of encouragement from this RN.

## 2020-02-10 NOTE — ED Notes (Addendum)
Pt aware urine sample needed. Pt did not provide sample. Urine cup left on toilet. Will attempt to collect later.

## 2020-02-10 NOTE — ED Notes (Signed)
RN crushed med and put in apple sauce in an attempt to get meds in him-Monique,RN

## 2020-02-10 NOTE — ED Notes (Signed)
This RN encouraging pt to take medications to help him feel better. Pt refusing. Pt states he "feels fine and doesn't need medication." Pt requesting ice pack. Sitter at bedside. Will continue to monitor.

## 2020-02-11 LAB — CBC WITH DIFFERENTIAL/PLATELET
Abs Immature Granulocytes: 0.01 10*3/uL (ref 0.00–0.07)
Basophils Absolute: 0.1 10*3/uL (ref 0.0–0.1)
Basophils Relative: 1 %
Eosinophils Absolute: 0.4 10*3/uL (ref 0.0–0.5)
Eosinophils Relative: 6 %
HCT: 44 % (ref 39.0–52.0)
Hemoglobin: 14.3 g/dL (ref 13.0–17.0)
Immature Granulocytes: 0 %
Lymphocytes Relative: 36 %
Lymphs Abs: 2.1 10*3/uL (ref 0.7–4.0)
MCH: 30.8 pg (ref 26.0–34.0)
MCHC: 32.5 g/dL (ref 30.0–36.0)
MCV: 94.8 fL (ref 80.0–100.0)
Monocytes Absolute: 0.7 10*3/uL (ref 0.1–1.0)
Monocytes Relative: 13 %
Neutro Abs: 2.6 10*3/uL (ref 1.7–7.7)
Neutrophils Relative %: 44 %
Platelets: 141 10*3/uL — ABNORMAL LOW (ref 150–400)
RBC: 4.64 MIL/uL (ref 4.22–5.81)
RDW: 13.3 % (ref 11.5–15.5)
WBC: 5.8 10*3/uL (ref 4.0–10.5)
nRBC: 0 % (ref 0.0–0.2)

## 2020-02-11 NOTE — ED Notes (Signed)
Breakfast Ordered 

## 2020-02-11 NOTE — ED Provider Notes (Signed)
Emergency Medicine Observation Re-evaluation Note  Anthony Solis is a 64 y.o. male, seen on rounds today.  Pt initially presented to the ED for complaints of Manic Behavior and Aggressive Behavior Currently, the patient is sleeping. Sitter is at the bedside.  Physical Exam  BP 110/88 (BP Location: Left Arm)   Pulse (!) 106   Temp 98.3 F (36.8 C) (Oral)   Resp 18   SpO2 98%  Physical Exam PE: Constitutional: well-developed, well-nourished, no apparent distress HENT: normocephalic, atraumatic. Pulmonary/Chest: effort normal Abdominal: nondistended Musculoskeletal:  no edema Skin: warm and dry, no rash, no diaphoresis Psychiatric: sleeping, unable to reassess  ED Course / MDM  EKG:    I have reviewed the labs performed to date as well as medications administered while in observation.  Recent changes in the last 24 hours include none. Plan  Patient had TTS evaluation yesterday afternoon at 2:38 PM with continued plan of inpatient admission.   Patient is under full IVC at this time.    Portions of this note were generated with Scientist, clinical (histocompatibility and immunogenetics). Dictation errors may occur despite best attempts at proofreading.    Sherene Sires, PA-C 02/11/20 1147    Margarita Grizzle, MD 02/12/20 1343

## 2020-02-11 NOTE — Progress Notes (Signed)
Pt continues to meet inpatient criteria. Referral information has been re-faxed to the following hospitals for review:  [facility]   [facility]   [facility]   [facility]   [facility]   [facility]   [facility]   [facility]   [facility]   [facility]   [facility]   [facility]    Disposition will continue to follow.   Wells Guiles, LCSW, LCAS Disposition CSW [facility]/TTS [phone]

## 2020-02-11 NOTE — ED Notes (Addendum)
Patient is resting comfortably. Pt took seroquel 200 mg crushed in applesauce last night and has been sleeping soundly. He has not taken PO meds for days so RN anticipates sedation as meds get back in system.

## 2020-02-11 NOTE — ED Notes (Signed)
Pt's lunch ordered 

## 2020-02-11 NOTE — BHH Counselor (Signed)
This counselor has reached out to multiple [facility] numbers to see if they can review patient for admission. Also sent secure chat. Will continue to try to reach them for review.

## 2020-02-12 LAB — URINALYSIS, ROUTINE W REFLEX MICROSCOPIC
Bilirubin Urine: NEGATIVE
Glucose, UA: NEGATIVE mg/dL
Hgb urine dipstick: NEGATIVE
Ketones, ur: NEGATIVE mg/dL
Leukocytes,Ua: NEGATIVE
Nitrite: NEGATIVE
Protein, ur: NEGATIVE mg/dL
Specific Gravity, Urine: 1.006 (ref 1.005–1.030)
pH: 8 (ref 5.0–8.0)

## 2020-02-12 NOTE — BHH Counselor (Signed)
Clinician received a call from [facility] from [facility] expressing she only received four pages of pt's referral. Clinician refaxed pt's referral.   Redmond Pulling, MS, [facility], [facility] Triage Specialist [phone]

## 2020-02-12 NOTE — BHH Counselor (Signed)
Clinician received "failure notice," from fax. Clinician checked the number provided, the fax number was correct. Clinician refaxed pt's referral to [facility] and received another failure notice.    Redmond Pulling, MS, [facility], [facility] Triage Specialist [phone]

## 2020-02-12 NOTE — ED Notes (Addendum)
Pt refusing to take medications. Medications crushed and put in applesauce.

## 2020-02-12 NOTE — ED Notes (Signed)
Lunch Tray Ordered @ 1052.  

## 2020-02-12 NOTE — BH Assessment (Signed)
Continued Inpatient. Refaxed referrals to the following facilities for consideration of a bed.  [facility]  [facility]  [facility]  [facility]  [facility]  [facility]  [facility]  [facility]  [facility]  [facility]  [facility]  [facility]  [facility]  [facility]

## 2020-02-12 NOTE — ED Provider Notes (Signed)
Emergency Medicine Observation Re-evaluation Note  Anthony Solis is a 64 y.o. male, seen on rounds today.  Pt initially presented to the ED for complaints of Manic Behavior and Aggressive Behavior Currently, the patient is sitting on the side of the bed watching television. He has been calm and cooperative during exam.  Physical Exam  BP 127/82 (BP Location: Left Arm)   Pulse 88   Temp (!) 97.5 F (36.4 C) (Oral)   Resp 18   SpO2 100%  Physical Exam PE: Constitutional: well-developed, well-nourished, no apparent distress HENT: normocephalic, atraumatic.  Cardiovascular: normal rate and rhythm, distal pulses intact Pulmonary/Chest: effort normal; breath sounds clear and equal bilaterally; no wheezes or rales Abdominal: soft and nontender Musculoskeletal: full ROM, no edema Neurological: alert with goal directed thinking Skin: warm and dry, no rash, no diaphoresis Psychiatric: normal mood and affect, normal behavior   ED Course / MDM  EKG:    I have reviewed the labs performed to date as well as medications administered while in observation.  Recent changes in the last 24 hours include negative UA.  RN reported that overnight patient had urinary incontinence.  UA was collected and has no signs of infection.  Plan  Current plan is for inpatient psychiatric treatment.  Earlier this evening at 6:30 PM there a note from counselor Marina Goodell documenting that bed requests have been faxed to multiple facilities.   Patient is under full IVC at this time.   Portions of this note were generated with Scientist, clinical (histocompatibility and immunogenetics). Dictation errors may occur despite best attempts at proofreading.    Sherene Sires, PA-C 02/12/20 Anthony Solis    Pricilla Loveless, MD 02/17/20 [phone]

## 2020-02-12 NOTE — BH Assessment (Signed)
TTS attempted to re-assess patient .  He denied hearing voices and denies SI/HI.  However, right in the middle of the assessment, he got up and urinated and when he finished, he stated that he did not want to talk to TTS.  Patient did not seem as manic today as he did earlier in the week, but his mood appears to be labile and he appeared to be irritable.  TTS continues to recommend inpatient treatment.

## 2020-02-12 NOTE — ED Notes (Signed)
Dinner ordered 

## 2020-02-13 ENCOUNTER — Inpatient Hospital Stay (HOSPITAL_COMMUNITY)
Admission: EM | Admit: 2020-02-13 | Discharge: 2020-02-18 | DRG: 885 | Disposition: A | Discharge status: Nursing Home (Includes ICF) | Payer: Medicare Other | Attending: Psychiatry | Admitting: Psychiatry

## 2020-02-13 ENCOUNTER — Encounter (HOSPITAL_COMMUNITY): Payer: Self-pay | Admitting: Psychiatry

## 2020-02-13 DIAGNOSIS — B182 Chronic viral hepatitis C: Secondary | ICD-10-CM | POA: Diagnosis present

## 2020-02-13 DIAGNOSIS — Z7289 Other problems related to lifestyle: Secondary | ICD-10-CM | POA: Diagnosis not present

## 2020-02-13 DIAGNOSIS — I10 Essential (primary) hypertension: Secondary | ICD-10-CM | POA: Diagnosis present

## 2020-02-13 DIAGNOSIS — E119 Type 2 diabetes mellitus without complications: Secondary | ICD-10-CM | POA: Diagnosis present

## 2020-02-13 DIAGNOSIS — F209 Schizophrenia, unspecified: Secondary | ICD-10-CM | POA: Diagnosis present

## 2020-02-13 DIAGNOSIS — E039 Hypothyroidism, unspecified: Secondary | ICD-10-CM | POA: Diagnosis present

## 2020-02-13 DIAGNOSIS — K589 Irritable bowel syndrome without diarrhea: Secondary | ICD-10-CM | POA: Diagnosis present

## 2020-02-13 DIAGNOSIS — Z79899 Other long term (current) drug therapy: Secondary | ICD-10-CM | POA: Diagnosis not present

## 2020-02-13 DIAGNOSIS — Z560 Unemployment, unspecified: Secondary | ICD-10-CM | POA: Diagnosis not present

## 2020-02-13 DIAGNOSIS — K219 Gastro-esophageal reflux disease without esophagitis: Secondary | ICD-10-CM | POA: Diagnosis present

## 2020-02-13 DIAGNOSIS — G4700 Insomnia, unspecified: Secondary | ICD-10-CM | POA: Diagnosis present

## 2020-02-13 DIAGNOSIS — Z5329 Procedure and treatment not carried out because of patient's decision for other reasons: Secondary | ICD-10-CM | POA: Diagnosis not present

## 2020-02-13 DIAGNOSIS — F203 Undifferentiated schizophrenia: Principal | ICD-10-CM | POA: Diagnosis present

## 2020-02-13 DIAGNOSIS — D696 Thrombocytopenia, unspecified: Secondary | ICD-10-CM | POA: Diagnosis present

## 2020-02-13 DIAGNOSIS — E785 Hyperlipidemia, unspecified: Secondary | ICD-10-CM | POA: Diagnosis present

## 2020-02-13 DIAGNOSIS — E7801 Familial hypercholesterolemia: Secondary | ICD-10-CM | POA: Diagnosis present

## 2020-02-13 DIAGNOSIS — F17210 Nicotine dependence, cigarettes, uncomplicated: Secondary | ICD-10-CM | POA: Diagnosis present

## 2020-02-13 DIAGNOSIS — Z79890 Hormone replacement therapy (postmenopausal): Secondary | ICD-10-CM | POA: Diagnosis not present

## 2020-02-13 LAB — URINE CULTURE: Culture: NO GROWTH

## 2020-02-13 LAB — HEMOGLOBIN A1C
Hgb A1c MFr Bld: 6.1 % — ABNORMAL HIGH (ref 4.8–5.6)
Mean Plasma Glucose: 128.37 mg/dL

## 2020-02-13 LAB — TSH: TSH: 2.822 u[IU]/mL (ref 0.350–4.500)

## 2020-02-13 MED ORDER — DIPHENHYDRAMINE HCL 50 MG/ML IJ SOLN: 50.0000 mg | Freq: Three times a day (TID) | INTRAMUSCULAR | Status: DC | PRN

## 2020-02-13 MED ORDER — QUETIAPINE FUMARATE 200 MG PO TABS
200.0000 mg | ORAL_TABLET | Freq: Every day | ORAL | Status: DC
  Administered 2020-02-13 – 2020-02-17 (×5): 200 mg via ORAL
  Filled 2020-02-13 (×8): qty 1

## 2020-02-13 MED ORDER — ALUM & MAG HYDROXIDE-SIMETH 200-200-20 MG/5ML PO SUSP: 30.0000 mL | ORAL | Status: DC | PRN

## 2020-02-13 MED ORDER — CLOZAPINE 100 MG PO TABS
200.0000 mg | ORAL_TABLET | Freq: Two times a day (BID) | ORAL | Status: DC
  Filled 2020-02-13 (×2): qty 2

## 2020-02-13 MED ORDER — MIRTAZAPINE 30 MG PO TABS
30.0000 mg | ORAL_TABLET | Freq: Every day | ORAL | Status: DC
  Administered 2020-02-13 – 2020-02-17 (×5): 30 mg via ORAL
  Filled 2020-02-13 (×3): qty 1
  Filled 2020-02-13: qty 2
  Filled 2020-02-13 (×3): qty 1

## 2020-02-13 MED ORDER — HALOPERIDOL 5 MG PO TABS
10.0000 mg | ORAL_TABLET | Freq: Two times a day (BID) | ORAL | Status: DC
  Administered 2020-02-13: 10 mg via ORAL
  Filled 2020-02-13 (×7): qty 2

## 2020-02-13 MED ORDER — HALOPERIDOL LACTATE 5 MG/ML IJ SOLN: 5.0000 mg | Freq: Three times a day (TID) | INTRAMUSCULAR | Status: DC | PRN

## 2020-02-13 MED ORDER — QUETIAPINE FUMARATE 50 MG PO TABS: 50.0000 mg | ORAL_TABLET | Freq: Two times a day (BID) | ORAL | Status: DC

## 2020-02-13 MED ORDER — LEVOTHYROXINE SODIUM 50 MCG PO TABS
50.0000 ug | ORAL_TABLET | Freq: Every day | ORAL | Status: DC
  Administered 2020-02-15 – 2020-02-18 (×4): 50 ug via ORAL
  Filled 2020-02-13 (×6): qty 1

## 2020-02-13 MED ORDER — HALOPERIDOL LACTATE 5 MG/ML IJ SOLN
10.0000 mg | Freq: Two times a day (BID) | INTRAMUSCULAR | Status: DC
  Filled 2020-02-13 (×6): qty 2

## 2020-02-13 MED ORDER — LEVOTHYROXINE SODIUM 50 MCG PO TABS: 50.0000 ug | ORAL_TABLET | Freq: Every day | ORAL | Status: DC

## 2020-02-13 MED ORDER — POLYETHYLENE GLYCOL 3350 17 G PO PACK: 17.0000 g | PACK | Freq: Every day | ORAL | Status: DC | PRN

## 2020-02-13 MED ORDER — DICYCLOMINE HCL 20 MG PO TABS
20.0000 mg | ORAL_TABLET | Freq: Three times a day (TID) | ORAL | Status: DC
  Administered 2020-02-13 – 2020-02-18 (×12): 20 mg via ORAL
  Filled 2020-02-13 (×20): qty 1

## 2020-02-13 MED ORDER — OLANZAPINE 10 MG PO TBDP: 10.0000 mg | ORAL_TABLET | Freq: Three times a day (TID) | ORAL | Status: DC | PRN

## 2020-02-13 MED ORDER — DOCUSATE SODIUM 100 MG PO CAPS
100.0000 mg | ORAL_CAPSULE | Freq: Every day | ORAL | Status: DC
  Administered 2020-02-15 – 2020-02-18 (×4): 100 mg via ORAL
  Filled 2020-02-13 (×6): qty 1

## 2020-02-13 MED ORDER — MIRTAZAPINE 30 MG PO TABS: 30.0000 mg | ORAL_TABLET | Freq: Every day | ORAL | Status: DC

## 2020-02-13 MED ORDER — QUETIAPINE FUMARATE 50 MG PO TABS
50.0000 mg | ORAL_TABLET | Freq: Two times a day (BID) | ORAL | Status: DC
  Administered 2020-02-13 – 2020-02-18 (×9): 50 mg via ORAL
  Filled 2020-02-13 (×13): qty 1

## 2020-02-13 MED ORDER — FUROSEMIDE 40 MG PO TABS: 40.0000 mg | ORAL_TABLET | Freq: Every day | ORAL | Status: DC

## 2020-02-13 MED ORDER — ZIPRASIDONE MESYLATE 20 MG IM SOLR: 20.0000 mg | INTRAMUSCULAR | Status: DC | PRN

## 2020-02-13 MED ORDER — LORAZEPAM 1 MG PO TABS: 1.0000 mg | ORAL_TABLET | ORAL | Status: DC | PRN

## 2020-02-13 MED ORDER — BENZTROPINE MESYLATE 0.5 MG PO TABS
0.5000 mg | ORAL_TABLET | Freq: Two times a day (BID) | ORAL | Status: DC
  Administered 2020-02-13 – 2020-02-18 (×9): 0.5 mg via ORAL
  Filled 2020-02-13 (×13): qty 1

## 2020-02-13 MED ORDER — MAGNESIUM HYDROXIDE 400 MG/5ML PO SUSP: 30.0000 mL | Freq: Every day | ORAL | Status: DC | PRN

## 2020-02-13 MED ORDER — SENNOSIDES-DOCUSATE SODIUM 8.6-50 MG PO TABS: 1.0000 | ORAL_TABLET | Freq: Every evening | ORAL | Status: DC | PRN

## 2020-02-13 MED ORDER — SIMVASTATIN 20 MG PO TABS
20.0000 mg | ORAL_TABLET | Freq: Every day | ORAL | Status: DC
  Administered 2020-02-13 – 2020-02-17 (×5): 20 mg via ORAL
  Filled 2020-02-13 (×7): qty 1

## 2020-02-13 MED ORDER — QUETIAPINE FUMARATE 200 MG PO TABS
200.0000 mg | ORAL_TABLET | Freq: Every day | ORAL | Status: DC
  Filled 2020-02-13: qty 1

## 2020-02-13 NOTE — ED Notes (Signed)
Lunch Tray Ordered @ 1048. °

## 2020-02-13 NOTE — ED Notes (Signed)
Pt refused to sign physical transfer document but verbally consented. This RN signed as witness.

## 2020-02-13 NOTE — ED Notes (Addendum)
Pt seems more irritable today- less talkative. Is mixing juices and milk and water and drinking all together. Noticeable shift in mood. RN called AC at [facility] and discussed. RN again requested 500 hall bed. She will contact RN when one opens.

## 2020-02-13 NOTE — ED Notes (Signed)
All patients belongings returned to [facility] at the time of transport.

## 2020-02-13 NOTE — Progress Notes (Signed)
Patient ID: Anthony Solis, male   DOB: Sep 04, 1955, 64 y.o.   MRN: [id number] Patient is disorganized of thought.  Unable to complete admission assessment with patient.  Patient admitted to the unit skin assessment and belongings search complete.  Paperwork signed.  Patient noted to have paper stuffed in his ears, actively talking to unseen other, laughing inappropriately and scribbling on paperwork.  Patient distracted difficult to follow directions, but otherwise calm and pleasant.  Patient was noted to be disheveled in appearance and malodorous. Patient is a poor historian and there was no collateral information provided.

## 2020-02-13 NOTE — ED Provider Notes (Signed)
Emergency Medicine Observation Re-evaluation Note  Anthony Solis is a 64 y.o. male, seen on rounds today.  Pt initially presented to the ED for complaints of Manic Behavior and Aggressive Behavior Currently, the patient is sitting comfortably in front of TV which is not turned on.  Per assigned nursing staff patient has been some more irritable today but less talkative.  Has been mixing orange juice and milk of water and drinking them together.  Nurse has been advocating for patient's placement at [facility].  Patient is calm and cooperative.  Very pleasant.  Physical Exam  BP 125/85 (BP Location: Right Arm)   Pulse 77   Temp 98.2 F (36.8 C) (Oral)   Resp 16   SpO2 100%  Physical Exam Vitals and nursing note reviewed.  Constitutional:      General: He is not in acute distress.    Appearance: Normal appearance. He is not ill-appearing.  HENT:     Head: Normocephalic and atraumatic.  Eyes:     General: No scleral icterus.       Right eye: No discharge.        Left eye: No discharge.     Conjunctiva/sclera: Conjunctivae normal.  Pulmonary:     Effort: Pulmonary effort is normal.     Breath sounds: No stridor.  Neurological:     Mental Status: He is alert and oriented to person, place, and time. Mental status is at baseline.  Psychiatric:     Comments: Patient with somewhat pressured speech but very pleasant.  Able answer questions appropriately and follow commands.  Some evidence of flight of ideas / tangential thinking during my examination     ED Course / MDM  EKG:    I have reviewed the labs performed to date as well as medications administered while in observation.    Plan  Current plan is for psychiatric placement.. Patient is under full IVC at this time.  Nursing continues to advocate for [facility] placement.   Gailen Shelter, [place] 02/13/20 1218    Melene Plan, DO 02/13/20 1236

## 2020-02-14 DIAGNOSIS — F203 Undifferentiated schizophrenia: Principal | ICD-10-CM

## 2020-02-14 LAB — GLUCOSE, CAPILLARY: Glucose-Capillary: 107 mg/dL — ABNORMAL HIGH (ref 70–99)

## 2020-02-14 MED ORDER — HALOPERIDOL LACTATE 5 MG/ML IJ SOLN
15.0000 mg | Freq: Two times a day (BID) | INTRAMUSCULAR | Status: DC
  Filled 2020-02-14 (×10): qty 3

## 2020-02-14 MED ORDER — CLOZAPINE 100 MG PO TABS
200.0000 mg | ORAL_TABLET | Freq: Two times a day (BID) | ORAL | Status: DC
  Administered 2020-02-14 – 2020-02-18 (×8): 200 mg via ORAL
  Filled 2020-02-14 (×11): qty 2

## 2020-02-14 MED ORDER — HALOPERIDOL 5 MG PO TABS
15.0000 mg | ORAL_TABLET | Freq: Two times a day (BID) | ORAL | Status: DC
  Administered 2020-02-14 – 2020-02-18 (×8): 15 mg via ORAL
  Filled 2020-02-14 (×10): qty 3

## 2020-02-14 NOTE — Plan of Care (Signed)
  Problem: Education: Goal: Knowledge of [place] [facility] Education information/materials will improve Outcome: Progressing Goal: Emotional status will improve Outcome: Progressing Goal: Mental status will improve Outcome: Progressing Goal: Verbalization of understanding the information provided will improve Outcome: Progressing   Problem: Activity: Goal: Interest or engagement in activities will improve Outcome: Progressing Goal: Sleeping patterns will improve Outcome: Progressing   Problem: Coping: Goal: Ability to verbalize frustrations and anger appropriately will improve Outcome: Progressing Goal: Ability to demonstrate self-control will improve Outcome: Progressing   Problem: Health Behavior/Discharge Planning: Goal: Identification of resources available to assist in meeting health care needs will improve Outcome: Progressing Goal: Compliance with treatment plan for underlying cause of condition will improve Outcome: Progressing   Problem: Physical Regulation: Goal: Ability to maintain clinical measurements within normal limits will improve Outcome: Progressing   Problem: Safety: Goal: Periods of time without injury will increase Outcome: Progressing   Problem: Activity: Goal: Will verbalize the importance of balancing activity with adequate rest periods Outcome: Progressing   Problem: Education: Goal: Will be free of psychotic symptoms Outcome: Progressing Goal: Knowledge of the prescribed therapeutic regimen will improve Outcome: Progressing   Problem: Coping: Goal: Coping ability will improve Outcome: Progressing Goal: Will verbalize feelings Outcome: Progressing   Problem: Health Behavior/Discharge Planning: Goal: Compliance with prescribed medication regimen will improve Outcome: Progressing   Problem: Nutritional: Goal: Ability to achieve adequate nutritional intake will improve Outcome: Progressing   Problem: Role Relationship: Goal:  Ability to communicate needs accurately will improve Outcome: Progressing Goal: Ability to interact with others will improve Outcome: Progressing   Problem: Safety: Goal: Ability to redirect hostility and anger into socially appropriate behaviors will improve Outcome: Progressing Goal: Ability to remain free from injury will improve Outcome: Progressing   Problem: Self-Care: Goal: Ability to participate in self-care as condition permits will improve Outcome: Progressing   Problem: Self-Concept: Goal: Will verbalize positive feelings about self Outcome: Progressing  Patient does not engage Clinical research associate in 1:1 therapeutic conversations.  Patient was noted to take medications this shift.  Patient has been withdrawn to room and has been noted talking and laughing with unseen other.  Patient able to maintain safety, continue to monitor as planned.

## 2020-02-14 NOTE — BHH Group Notes (Signed)
BHH Group Notes: (Clinical Social Work)   02/14/2020      Type of Therapy:  Group Therapy   Participation Level:  Did Not Attend - was invited individually by Nurse/MHT and chose not to attend.   Leisa Lenz, LCSW 02/14/2020  12:25 PM

## 2020-02-14 NOTE — Progress Notes (Signed)
Pharmacy:  Clozapine   Pt is active on clozapine [facility]. Requires monthly CBC with dif   Peggye Fothergill, [place] D

## 2020-02-14 NOTE — Progress Notes (Signed)
°   02/13/20 2120  °COVID-19 Daily Checkoff  °Have you had a fever (temp > 37.80C/100F)  in the past 24 hours?  No  °If you have had runny nose, nasal congestion, sneezing in the past 24 hours, has it worsened? No  °COVID-19 EXPOSURE  °Have you traveled outside the state in the past 14 days? No  °Have you been in contact with someone with a confirmed diagnosis of COVID-19 or PUI in the past 14 days without wearing appropriate PPE? No  °Have you been living in the same home as a person with confirmed diagnosis of COVID-19 or a PUI (household contact)? No  °Have you been diagnosed with COVID-19? No  ° °

## 2020-02-14 NOTE — BHH Group Notes (Signed)
Adult Psychoeducational Group Note  Date:  02/14/2020 Time:  12:00 PM  Group Topic/Focus:  Goals Group:   The focus of this group is to help patients establish daily goals to achieve during treatment and discuss how the patient can incorporate goal setting into their daily lives to aide in recovery.  Participation Level:  Did Not Attend   Dione Housekeeper 02/14/2020, 12:00 PM
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
## 2020-02-15 LAB — GLUCOSE, CAPILLARY: Glucose-Capillary: 120 mg/dL — ABNORMAL HIGH (ref 70–99)

## 2020-02-15 NOTE — [facility] Group Notes (Signed)
[facility] Group Notes: (Clinical Social Work)   02/15/2020      Type of Therapy:  Group Therapy   Participation Level:  Did Not Attend - was invited individually by Nurse/MHT and chose not to attend.   Leisa Lenz, LCSW 02/15/2020  1:04 PM

## 2020-02-15 NOTE — Progress Notes (Signed)
   02/14/20 2140  COVID-19 Daily Checkoff  Have you had a fever (temp > 37.80C/100F)  in the past 24 hours?  No  If you have had runny nose, nasal congestion, sneezing in the past 24 hours, has it worsened? No  COVID-19 EXPOSURE  Have you traveled outside the state in the past 14 days? No  Have you been in contact with someone with a confirmed diagnosis of COVID-19 or PUI in the past 14 days without wearing appropriate PPE? No  Have you been living in the same home as a person with confirmed diagnosis of COVID-19 or a PUI (household contact)? No  Have you been diagnosed with COVID-19? No

## 2020-02-15 NOTE — Progress Notes (Signed)
°   02/15/20 2100  COVID-19 Daily Checkoff  Have you had a fever (temp > 37.80C/100F)  in the past 24 hours?  No  If you have had runny nose, nasal congestion, sneezing in the past 24 hours, has it worsened? No  COVID-19 EXPOSURE  Have you traveled outside the state in the past 14 days? No  Have you been in contact with someone with a confirmed diagnosis of COVID-19 or PUI in the past 14 days without wearing appropriate PPE? No  Have you been living in the same home as a person with confirmed diagnosis of COVID-19 or a PUI (household contact)? No  Have you been diagnosed with COVID-19? No

## 2020-02-15 NOTE — Progress Notes (Signed)
   02/14/20 2140  COVID-19 Daily Checkoff  Have you had a fever (temp > 37.80C/100F)  in the past 24 hours?  No  If you have had runny nose, nasal congestion, sneezing in the past 24 hours, has it worsened? No  COVID-19 EXPOSURE  Have you traveled outside the state in the past 14 days? No  Have you been in contact with someone with a confirmed diagnosis of COVID-19 or PUI in the past 14 days without wearing appropriate PPE? No  Have you been living in the same home as a person with confirmed diagnosis of COVID-19 or a PUI (household contact)? No  Have you been diagnosed with COVID-19? No   

## 2020-02-15 NOTE — Plan of Care (Signed)
Progress note  D: pt found in bed; compliant with medication administration. Pt presents with bizarre dress. Pt denies any physical complaints or pain but seems be preoccupied. Pt also seemed as if they were talking to someone else as their conversation wasn't congruent with ours. Pt is pleasant though. Pt denies si/hi/ah/vh and verbally agrees to approach staff if these become apparent or before harming themself/others while at [facility].  A: Pt provided support and encouragement. Pt given medication per protocol and standing orders. Q54m safety checks implemented and continued.  R: Pt safe on the unit. Will continue to monitor.  Pt progressing in the following metrics  Problem: Role Relationship: Goal: Ability to communicate needs accurately will improve Outcome: Progressing Goal: Ability to interact with others will improve Outcome: Progressing   Problem: Safety: Goal: Ability to remain free from injury will improve Outcome: Progressing   Problem: Self-Concept: Goal: Will verbalize positive feelings about self Outcome: Progressing
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
## 2020-02-15 NOTE — Progress Notes (Signed)
Patient isolative to his room other than taking his medications and getting his snack and returned to his room. Safety maintained on unit with 15 min checks.

## 2020-02-16 LAB — COMPREHENSIVE METABOLIC PANEL
ALT: 63 U/L — ABNORMAL HIGH (ref 0–44)
AST: 44 U/L — ABNORMAL HIGH (ref 15–41)
Albumin: 3.9 g/dL (ref 3.5–5.0)
Alkaline Phosphatase: 49 U/L (ref 38–126)
Anion gap: 11 (ref 5–15)
BUN: 13 mg/dL (ref 8–23)
CO2: 25 mmol/L (ref 22–32)
Calcium: 9 mg/dL (ref 8.9–10.3)
Chloride: 106 mmol/L (ref 98–111)
Creatinine, Ser: 0.98 mg/dL (ref 0.61–1.24)
GFR calc Af Amer: >60 mL/min (ref >60)
GFR calc non Af Amer: >60 mL/min (ref >60)
Glucose, Bld: 133 mg/dL — ABNORMAL HIGH (ref 70–99)
Potassium: 4.7 mmol/L (ref 3.5–5.1)
Sodium: 142 mmol/L (ref 135–145)
Total Bilirubin: 0.4 mg/dL (ref 0.3–1.2)
Total Protein: 6.3 g/dL — ABNORMAL LOW (ref 6.5–8.1)

## 2020-02-16 LAB — AMMONIA: Ammonia: 32 umol/L (ref 9–35)
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
## 2020-02-16 NOTE — Progress Notes (Signed)
Recreation Therapy Notes  INPATIENT RECREATION THERAPY ASSESSMENT  Patient Details Name: Anthony Solis MRN: [id number] DOB: 20-Sep-1955 Today's Date: 02/16/2020       Information Obtained From: Patient  Able to Participate in Assessment/Interview: Yes  Patient Presentation: Alert (Soft spoken)  Reason for Admission (Per Patient): Patient Unable to Identify  Patient Stressors:  (None identified)  Coping Skills:   Journal, Sports, Music, Exercise, Meditate, Deep Breathing, Talk, Read  Leisure Interests (2+):  Sports - Golf, Sports - Swimming, Sports - Other (Comment), Music - Other (Comment), Individual - Other (Comment) (Make CDs/DVDs; Tennis)  Frequency of Recreation/Participation: Other (Comment) (Daily)  Awareness of Community Resources:  Yes  Community Resources:  Library, [employer]  Current Use: Yes  If no, Barriers?:    Expressed Interest in [employer] Information: No  [employer] of Residence:  [place]  Patient Main Form of Transportation: Set designer  Patient Strengths:  God; Power  Patient Identified Areas of Improvement:  None  Patient Goal for Hospitalization:  "I don't know"  Current SI (including self-harm):  No  Current HI:  No  Current AVH: No  Staff Intervention Plan: Group Attendance, Collaborate with Interdisciplinary Treatment Team  Consent to Intern Participation: N/A     Caroll Rancher, LRT/CTRS  Caroll Rancher A 02/16/2020, 1:09 PM

## 2020-02-16 NOTE — Progress Notes (Signed)
Recreation Therapy Notes  Date: 6.28.21 Time: 1000 Location: 500 Hall Dayroom  Group Topic: Coping Skills  Goal Area(s) Addresses:  Patient will be able to identify positive coping skills. Patient will be able to identify benefit of using coping skills post d/c.  Intervention: Pencils, Blank mind map, Board, Dry erase markers  Activity: Mind Map.  LRT and patients filled in the first 8 boxes (anger, communication, sadness, depression, finances, relationships, manic and irritable) together.  Patients were then given time to come up with at least 3 coping skills for each area identified.  Coping skills would then be written on the board next to the corresponding issue.  Education: Pharmacologist, Building control surveyor.   Education Outcome: Acknowledges understanding/In group clarification offered/Needs additional education.   Clinical Observations/Feedback: Pt did not attend group session.       Caroll Rancher, LRT/CTRS         Lillia Abed, Cherrie Franca A 02/16/2020 11:59 AM

## 2020-02-16 NOTE — Tx Team (Signed)
Interdisciplinary Treatment and Diagnostic Plan Update  02/16/2020 Time of Session: 9:00am Anthony Solis MRN: [id number]  Principal Diagnosis: <principal problem not specified>  Secondary Diagnoses: Active Problems:   Schizophrenia ([place])   Current Medications:  Current Facility-Administered Medications  Medication Dose Route Frequency Provider Last Rate Last Admin  . alum & mag hydroxide-simeth (MAALOX/MYLANTA) 200-200-20 MG/5ML suspension 30 mL  30 mL Oral Q4H PRN Sharma Covert, MD      . benztropine (COGENTIN) tablet 0.5 mg  0.5 mg Oral BID Sharma Covert, MD   0.5 mg at 02/16/20 6283  . cloZAPine (CLOZARIL) tablet 200 mg  200 mg Oral BID Sharma Covert, MD   200 mg at 02/16/20 1517  . dicyclomine (BENTYL) tablet 20 mg  20 mg Oral TID AC Sharma Covert, MD   20 mg at 02/16/20 6160  . diphenhydrAMINE (BENADRYL) injection 50 mg  50 mg Intramuscular Q8H PRN Sharma Covert, MD      . docusate sodium (COLACE) capsule 100 mg  100 mg Oral Daily Sharma Covert, MD   100 mg at 02/16/20 7371  . haloperidol (HALDOL) tablet 15 mg  15 mg Oral BID Sharma Covert, MD   15 mg at 02/16/20 0626   Or  . haloperidol lactate (HALDOL) injection 15 mg  15 mg Intramuscular BID Sharma Covert, MD      . levothyroxine (SYNTHROID) tablet 50 mcg  50 mcg Oral Q0600 Sharma Covert, MD   50 mcg at 02/16/20 [phone]  . OLANZapine zydis (ZYPREXA) disintegrating tablet 10 mg  10 mg Oral Q8H PRN Sharma Covert, MD       And  . LORazepam (ATIVAN) tablet 1 mg  1 mg Oral PRN Sharma Covert, MD       And  . ziprasidone (GEODON) injection 20 mg  20 mg Intramuscular PRN Sharma Covert, MD      . magnesium hydroxide (MILK OF MAGNESIA) suspension 30 mL  30 mL Oral Daily PRN Sharma Covert, MD      . mirtazapine (REMERON) tablet 30 mg  30 mg Oral QHS Sharma Covert, MD   30 mg at 02/15/20 2057  . polyethylene glycol (MIRALAX / GLYCOLAX) packet 17 g  17 g Oral Daily PRN  Sharma Covert, MD      . QUEtiapine (SEROQUEL) tablet 200 mg  200 mg Oral QHS Sharma Covert, MD   200 mg at 02/15/20 2057  . QUEtiapine (SEROQUEL) tablet 50 mg  50 mg Oral BID Sharma Covert, MD   50 mg at 02/16/20 4627  . senna-docusate (Senokot-S) tablet 1 tablet  1 tablet Oral QHS PRN Sharma Covert, MD      . simvastatin (ZOCOR) tablet 20 mg  20 mg Oral q1800 Sharma Covert, MD   20 mg at 02/15/20 1751   PTA Medications: Medications Prior to Admission  Medication Sig Dispense Refill Last Dose  . benztropine (COGENTIN) 0.5 MG tablet Take 0.5 mg by mouth 2 (two) times daily.   Past Week at Unknown time  . Cholecalciferol (D 400) 10 MCG (400 UNIT) CHEW    Past Week at Unknown time  . cholecalciferol (VITAMIN D) 400 UNITS TABS tablet Take 400 Units by mouth daily.   Past Week at Unknown time  . cloZAPine (CLOZARIL) 100 MG tablet Take 200 mg by mouth 2 (two) times daily.    Past Week at Unknown time  . dicyclomine (BENTYL) 20 MG  tablet Take 20 mg by mouth 3 (three) times daily.    Past Week at Unknown time  . docusate sodium (COLACE) 100 MG capsule Take 100 mg by mouth daily.   Past Week at Unknown time  . fenofibrate (TRICOR) 48 MG tablet Take 1 tablet by mouth daily.   Past Week at Unknown time  . levothyroxine (SYNTHROID, LEVOTHROID) 50 MCG tablet Take 50 mcg by mouth daily.   Past Week at Unknown time  . mirtazapine (REMERON) 30 MG tablet Take 30 mg by mouth at bedtime.   Past Week at Unknown time  . Multiple Vitamin (MULTIVITAMIN) tablet Take 1 tablet by mouth daily.   Past Week at Unknown time  . QUEtiapine (SEROQUEL) 200 MG tablet 1 tablet at bedtime.   Past Week at Unknown time  . QUEtiapine (SEROQUEL) 50 MG tablet Take 1 tablet by mouth in the morning and at bedtime.   Past Week at Unknown time  . senna (SENOKOT) 8.6 MG tablet Take 1 tablet by mouth at bedtime.   Past Week at Unknown time  . simvastatin (ZOCOR) 10 MG tablet Take 10 mg by mouth daily.   Past Week at  Unknown time    Patient Stressors:    Patient Strengths:    Treatment Modalities: Medication Management, Group therapy, Case management,  1 to 1 session with clinician, Psychoeducation, Recreational therapy.   Physician Treatment Plan for Primary Diagnosis: <principal problem not specified> Long Term Goal(s): Improvement in symptoms so as ready for discharge Improvement in symptoms so as ready for discharge   Short Term Goals: Ability to identify changes in lifestyle to reduce recurrence of condition will improve Ability to verbalize feelings will improve Ability to demonstrate self-control will improve Ability to identify and develop effective coping behaviors will improve Ability to maintain clinical measurements within normal limits will improve Ability to identify changes in lifestyle to reduce recurrence of condition will improve Ability to verbalize feelings will improve Ability to demonstrate self-control will improve Ability to identify and develop effective coping behaviors will improve Ability to maintain clinical measurements within normal limits will improve  Medication Management: Evaluate patient's response, side effects, and tolerance of medication regimen.  Therapeutic Interventions: 1 to 1 sessions, Unit Group sessions and Medication administration.  Evaluation of Outcomes: Not Met  Physician Treatment Plan for Secondary Diagnosis: Active Problems:   Schizophrenia ([place])  Long Term Goal(s): Improvement in symptoms so as ready for discharge Improvement in symptoms so as ready for discharge   Short Term Goals: Ability to identify changes in lifestyle to reduce recurrence of condition will improve Ability to verbalize feelings will improve Ability to demonstrate self-control will improve Ability to identify and develop effective coping behaviors will improve Ability to maintain clinical measurements within normal limits will improve Ability to identify changes  in lifestyle to reduce recurrence of condition will improve Ability to verbalize feelings will improve Ability to demonstrate self-control will improve Ability to identify and develop effective coping behaviors will improve Ability to maintain clinical measurements within normal limits will improve     Medication Management: Evaluate patient's response, side effects, and tolerance of medication regimen.  Therapeutic Interventions: 1 to 1 sessions, Unit Group sessions and Medication administration.  Evaluation of Outcomes: Not Met   RN Treatment Plan for Primary Diagnosis: <principal problem not specified> Long Term Goal(s): Knowledge of disease and therapeutic regimen to maintain health will improve  Short Term Goals: Ability to demonstrate self-control, Ability to verbalize feelings will improve, Ability to  identify and develop effective coping behaviors will improve and Compliance with prescribed medications will improve  Medication Management: RN will administer medications as ordered by provider, will assess and evaluate patient's response and provide education to patient for prescribed medication. RN will report any adverse and/or side effects to prescribing provider.  Therapeutic Interventions: 1 on 1 counseling sessions, Psychoeducation, Medication administration, Evaluate responses to treatment, Monitor vital signs and CBGs as ordered, Perform/monitor CIWA, COWS, AIMS and Fall Risk screenings as ordered, Perform wound care treatments as ordered.  Evaluation of Outcomes: Not Met   LCSW Treatment Plan for Primary Diagnosis: <principal problem not specified> Long Term Goal(s): Safe transition to appropriate next level of care at discharge, Engage patient in therapeutic group addressing interpersonal concerns.  Short Term Goals: Engage patient in aftercare planning with referrals and resources, Increase social support, Increase emotional regulation, Identify triggers associated with  mental health/substance abuse issues and Increase skills for wellness and recovery  Therapeutic Interventions: Assess for all discharge needs, 1 to 1 time with Social worker, Explore available resources and support systems, Assess for adequacy in community support network, Educate family and significant other(s) on suicide prevention, Complete Psychosocial Assessment, Interpersonal group therapy.  Evaluation of Outcomes: Not Met  Progress in Treatment: Attending groups: No. Participating in groups: No. Taking medication as prescribed: Yes. Toleration medication: Yes. Family/Significant other contact made: No, will contact:  supports when consents are granted. Patient understands diagnosis: No. Discussing patient identified problems/goals with staff: No. Medical problems stabilized or resolved: No. Denies suicidal/homicidal ideation: Yes. Issues/concerns per patient self-inventory: Yes.  New problem(s) identified: Yes, Describe:  patient remains acutely psychoatic and disorganized. Poor historian.  New Short Term/Long Term Goal(s):  medication management for mood stabilization; elimination of SI thoughts; development of comprehensive mental wellness/sobriety plan.  Patient Goals:  Unable to state a goal.  Discharge Plan or Barriers: CSW assessing. Patient is apparently from a group home.  Reason for Continuation of Hospitalization: Anxiety Delusions  Depression Hallucinations Medication stabilization  Estimated Length of Stay: 5-7 days  Attendees: Patient: 02/16/2020 11:45 AM  Physician:  02/16/2020 11:45 AM  Nursing:  02/16/2020 11:45 AM  RN Care Manager: 02/16/2020 11:45 AM  Social Worker: Stephanie Acre, [place] 02/16/2020 11:45 AM  Recreational Therapist:  02/16/2020 11:45 AM  Other:  02/16/2020 11:45 AM  Other:  02/16/2020 11:45 AM  Other: 02/16/2020 11:45 AM    Scribe for Treatment Team: Joellen Jersey, [place] 02/16/2020 11:45 AM

## 2020-02-16 NOTE — Progress Notes (Signed)
   02/15/20 2100  Psych Admission Type (Psych Patients Only)  Admission Status Involuntary  Psychosocial Assessment  Patient Complaints None  Eye Contact Fair  Facial Expression Flat  Affect Preoccupied;Appropriate to circumstance;Flat  Speech Soft  Interaction Cautious;Guarded;Minimal;Isolative  Motor Activity Slow  Appearance/Hygiene Disheveled;Poor hygiene  Behavior Characteristics Cooperative;Appropriate to situation  Mood Preoccupied;Pleasant  Thought Process  Coherency WDL  Content WDL  Delusions Paranoid  Perception WDL  Hallucination UTA  Judgment Poor  Confusion Mild  Danger to Self  Current suicidal ideation? Denies  Danger to Others  Danger to Others None reported or observed   

## 2020-02-16 NOTE — [facility] Group Notes (Signed)
The focus of this group is to help patients establish daily goals to achieve during treatment and discuss how the patient can incorporate goal setting into their daily lives to aide in recovery.  Pt did not attend group 

## 2020-02-16 NOTE — Progress Notes (Signed)
   02/15/20 2100  Psych Admission Type (Psych Patients Only)  Admission Status Involuntary  Psychosocial Assessment  Patient Complaints None  Eye Contact Fair  Facial Expression Flat  Affect Preoccupied;Appropriate to circumstance;Flat  Speech Soft  Interaction Cautious;Guarded;Minimal;Isolative  Motor Activity Slow  Appearance/Hygiene Disheveled;Poor hygiene  Behavior Characteristics Cooperative;Appropriate to situation  Mood Preoccupied;Pleasant  Thought Process  Coherency WDL  Content WDL  Delusions Paranoid  Perception WDL  Hallucination UTA  Judgment Poor  Confusion Mild  Danger to Self  Current suicidal ideation? Denies  Danger to Others  Danger to Others None reported or observed

## 2020-02-17 LAB — GLUCOSE, CAPILLARY
Glucose-Capillary: 121 mg/dL — ABNORMAL HIGH (ref 70–99)
Glucose-Capillary: 132 mg/dL — ABNORMAL HIGH (ref 70–99)

## 2020-02-17 NOTE — Progress Notes (Signed)
   02/17/20 1100  Psych Admission Type (Psych Patients Only)  Admission Status Involuntary  Psychosocial Assessment  Patient Complaints None  Eye Contact Fair  Facial Expression Flat  Affect Preoccupied;Appropriate to circumstance;Flat  Speech Soft  Interaction Cautious;Guarded;Minimal;Isolative  Motor Activity Slow  Appearance/Hygiene Disheveled;Poor hygiene  Behavior Characteristics Cooperative  Mood Pleasant  Thought Process  Coherency WDL;Incoherent  Content WDL  Delusions Paranoid  Perception WDL  Hallucination UTA  Judgment Poor  Confusion Mild  Danger to Self  Current suicidal ideation? Denies  Danger to Others  Danger to Others None reported or observed

## 2020-02-17 NOTE — Progress Notes (Signed)
Psychoeducational Group Note  Date:  02/17/2020 Time:  2035  Group Topic/Focus:  Wrap-Up Group:   The focus of this group is to help patients review their daily goal of treatment and discuss progress on daily workbooks.  Participation Level: Did Not Attend  Participation Quality:  Not Applicable  Affect:  Not Applicable  Cognitive:  Not Applicable  Insight:  Not Applicable  Engagement in Group: Not Applicable  Additional Comments:  The patient did not attend group this evening.   WILLY, PINKERTON S 02/17/2020, 8:35 PM
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
## 2020-02-17 NOTE — Progress Notes (Signed)
Pt continues to be paranoid on the unit incoherent at times.     02/17/20 0000  Psych Admission Type (Psych Patients Only)  Admission Status Involuntary  Psychosocial Assessment  Patient Complaints None  Eye Contact Fair  Facial Expression Flat  Affect Preoccupied;Appropriate to circumstance;Flat  Speech Soft  Interaction Cautious;Guarded;Minimal;Isolative  Motor Activity Slow  Appearance/Hygiene Disheveled;Poor hygiene  Behavior Characteristics Cooperative  Mood Pleasant  Thought Process  Coherency WDL  Content WDL  Delusions Paranoid  Perception WDL  Hallucination UTA  Judgment Poor  Confusion Mild  Danger to Self  Current suicidal ideation? Denies  Danger to Others  Danger to Others None reported or observed

## 2020-02-17 NOTE — [facility] Suicide Risk Assessment (Signed)
[facility] INPATIENT:  Family/Significant Other Suicide Prevention Education   Suicide Prevention Education:  Patient Refusal for Family/Significant Other Suicide Prevention Education: The patient has been unable to provide written consent for family/significant other to be provided Family/Significant Other Suicide Prevention Education during admission and/or prior to discharge.  Physician notified.     SPE completed with patient, as patient has been unable to provide consent for family contact. SPE pamphlet placed on chart for patient to share with supports at discharge.     Jamilette Suchocki MSW, LCSWA Clincal Social Worker  [facility]   
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
## 2020-02-17 NOTE — B[facility] Counselor (Signed)
CSW spoke with patients group home ([phone]) who reported that this patient is able to return to their facility post discharge.  Per staff at group home this patient is receiving services through [facility] and [facility] for therapy and medication management.     Ruthann Cancer MSW, [employer] Clincal Social Worker  [facility]

## 2020-02-17 NOTE — Progress Notes (Signed)
Pt continues to keep to to himself, pt paranoid at times and delusional    02/17/20 2100  Psych Admission Type (Psych Patients Only)  Admission Status Involuntary  Psychosocial Assessment  Patient Complaints None  Eye Contact Fair  Facial Expression Flat  Affect Preoccupied;Appropriate to circumstance;Flat  Speech Soft  Interaction Cautious;Guarded;Minimal;Isolative  Motor Activity Slow  Appearance/Hygiene Disheveled;Poor hygiene  Behavior Characteristics Cooperative  Mood Suspicious;Preoccupied  Thought Process  Coherency WDL;Incoherent  Content WDL  Delusions Paranoid  Perception WDL  Hallucination UTA  Judgment Poor  Confusion Mild  Danger to Self  Current suicidal ideation? Denies  Danger to Others  Danger to Others None reported or observed

## 2020-02-18 MED ORDER — CLOZAPINE 100 MG PO TABS: 200.0000 mg | ORAL_TABLET | Freq: Two times a day (BID) | ORAL | 0 refills | Status: DC

## 2020-02-18 MED ORDER — MIRTAZAPINE 30 MG PO TABS: 30.0000 mg | ORAL_TABLET | Freq: Every day | ORAL | 0 refills | Status: DC

## 2020-02-18 MED ORDER — BENZTROPINE MESYLATE 0.5 MG PO TABS: 0.5000 mg | ORAL_TABLET | Freq: Two times a day (BID) | ORAL | 0 refills | Status: DC

## 2020-02-18 MED ORDER — QUETIAPINE FUMARATE 50 MG PO TABS: 50.0000 mg | ORAL_TABLET | Freq: Two times a day (BID) | ORAL | 0 refills | Status: DC

## 2020-02-18 MED ORDER — QUETIAPINE FUMARATE 200 MG PO TABS: 200.0000 mg | ORAL_TABLET | Freq: Every day | ORAL | 0 refills | Status: DC

## 2020-02-18 MED ORDER — POLYETHYLENE GLYCOL 3350 17 G PO PACK: 17.0000 g | PACK | Freq: Every day | ORAL | 0 refills | Status: DC | PRN
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
## 2020-02-18 NOTE — Progress Notes (Signed)
  [facility] Adult Case Management Discharge Plan :  Will you be returning to the same living situation after discharge:  Yes,  to group home. At discharge, do you have transportation home?: No. Safe Transport will be arranged. Do you have the ability to pay for your medications: Yes,  has insurance.  Release of information consent forms completed and in the chart;  Patient's signature needed at discharge.  Patient to Follow up at:  Follow-up Information    [facility] Follow up.   Why: medication management Contact information: [address] [place], [place] [address]  P: [phone] F:        [facility]) Follow up.   Why: therapy Contact information: [address] [place], [place] [address]  P: [phone] F: [phone]              Next level of care provider has access to [facility] Link:no  Safety Planning and Suicide Prevention discussed: Yes,  with patient.     Has patient been referred to the Quitline?: Patient refused referral  Patient has been referred for addiction treatment: Pt. refused referral  Otelia Santee, LCSWA 02/18/2020, 9:49 AM

## 2020-02-18 NOTE — [facility] Suicide Risk Assessment (Signed)
[facility] Discharge Suicide Risk Assessment   Principal Problem: <principal problem not specified> Discharge Diagnoses: Active Problems:   Schizophrenia (HCC)   Total Time spent with patient: 20 minutes  Musculoskeletal: Strength & Muscle Tone: within normal limits Gait & Station: normal Patient leans: N/A  Psychiatric Specialty Exam: Review of Systems  Psychiatric/Behavioral: Positive for hallucinations.  All other systems reviewed and are negative.   Blood pressure 107/79, pulse 85, temperature 98.8 F (37.1 C), temperature source Oral, resp. rate 18.There is no height or weight on file to calculate BMI.  General Appearance: Casual  Eye Contact::  Good  Speech:  Normal Rate409  Volume:  Normal  Mood:  Euthymic  Affect:  Blunt  Thought Process:  Disorganized and Descriptions of Associations: Loose  Orientation:  Negative  Thought Content:  Hallucinations: Auditory  Suicidal Thoughts:  No  Homicidal Thoughts:  No  Memory:  Immediate;   Fair Recent;   Fair Remote;   Fair  Judgement:  Intact  Insight:  Fair  Psychomotor Activity:  Normal  Concentration:  Fair  Recall:  [employer] of Knowledge:Fair  Language: Fair  Akathisia:  Negative  Handed:  Right  AIMS (if indicated):     Assets:  Desire for Improvement Resilience  Sleep:  Number of Hours: 8.25  Cognition: Impaired,  Mild  ADL's:  Intact   Mental Status Per Nursing Assessment::   On Admission:  NA  Demographic Factors:  Male, Low socioeconomic status and Unemployed  Loss Factors: NA  Historical Factors: NA  Risk Reduction Factors:   Living with another person, especially a relative  Continued Clinical Symptoms:  Schizophrenia:   Command hallucinatons Paranoid or undifferentiated type  Cognitive Features That Contribute To Risk:  Thought constriction (tunnel vision)    Suicide Risk:  Minimal: No identifiable suicidal ideation.  Patients presenting with no risk factors but with morbid ruminations;  may be classified as minimal risk based on the severity of the depressive symptoms   Follow-up Information    [facility] Follow up.   Why: medication management Contact information: [address] [place], [place] [address]  P: [phone] F:        [facility]) Follow up.   Why: therapy Contact information: [phone] [place], [place] [address]  P: [phone] F: [phone]              Plan Of Care/Follow-up recommendations:  Activity:  ad lib  Antonieta Pert, MD 02/18/2020, 9:45 AM

## 2020-02-18 NOTE — Plan of Care (Signed)
Discharge note  Patient verbalizes readiness for discharge. Follow up plan explained, AVS, Transition record and SRA given. Prescriptions and teaching provided. Belongings returned and signed for. Suicide safety plan completed and signed. Patient verbalizes understanding. Patient denies SI/HI and assures this Clinical research associate they will seek assistance should that change. Patient discharged to lobby where safe transport was waiting.  Problem: Education: Goal: Knowledge of  General Education information/materials will improve Outcome: Adequate for Discharge Goal: Emotional status will improve Outcome: Adequate for Discharge Goal: Mental status will improve Outcome: Adequate for Discharge Goal: Verbalization of understanding the information provided will improve Outcome: Adequate for Discharge   Problem: Activity: Goal: Interest or engagement in activities will improve Outcome: Adequate for Discharge Goal: Sleeping patterns will improve Outcome: Adequate for Discharge   Problem: Coping: Goal: Ability to verbalize frustrations and anger appropriately will improve Outcome: Adequate for Discharge Goal: Ability to demonstrate self-control will improve Outcome: Adequate for Discharge   Problem: Health Behavior/Discharge Planning: Goal: Identification of resources available to assist in meeting health care needs will improve Outcome: Adequate for Discharge Goal: Compliance with treatment plan for underlying cause of condition will improve Outcome: Adequate for Discharge   Problem: Physical Regulation: Goal: Ability to maintain clinical measurements within normal limits will improve Outcome: Adequate for Discharge   Problem: Safety: Goal: Periods of time without injury will increase Outcome: Adequate for Discharge   Problem: Activity: Goal: Will verbalize the importance of balancing activity with adequate rest periods Outcome: Adequate for Discharge   Problem: Education: Goal:  Will be free of psychotic symptoms Outcome: Adequate for Discharge Goal: Knowledge of the prescribed therapeutic regimen will improve Outcome: Adequate for Discharge   Problem: Coping: Goal: Coping ability will improve Outcome: Adequate for Discharge Goal: Will verbalize feelings Outcome: Adequate for Discharge   Problem: Health Behavior/Discharge Planning: Goal: Compliance with prescribed medication regimen will improve Outcome: Adequate for Discharge   Problem: Nutritional: Goal: Ability to achieve adequate nutritional intake will improve Outcome: Adequate for Discharge   Problem: Role Relationship: Goal: Ability to communicate needs accurately will improve Outcome: Adequate for Discharge Goal: Ability to interact with others will improve Outcome: Adequate for Discharge   Problem: Safety: Goal: Ability to redirect hostility and anger into socially appropriate behaviors will improve Outcome: Adequate for Discharge Goal: Ability to remain free from injury will improve Outcome: Adequate for Discharge   Problem: Self-Care: Goal: Ability to participate in self-care as condition permits will improve Outcome: Adequate for Discharge   Problem: Self-Concept: Goal: Will verbalize positive feelings about self Outcome: Adequate for Discharge

## 2020-02-19 LAB — CLOZAPINE (CLOZARIL)
Clozapine Lvl: 237 ng/mL — ABNORMAL LOW (ref 350–650)
Clozapine Lvl: 344 ng/mL — ABNORMAL LOW (ref 350–650)
NorClozapine: 101 ng/mL
NorClozapine: 169 ng/mL
Total(Cloz+Norcloz): 338 ng/mL
Total(Cloz+Norcloz): 513 ng/mL

## 2020-02-27 LAB — GLUCOSE, CAPILLARY: Glucose-Capillary: 90 mg/dL (ref 70–99)

## 2020-12-29 ENCOUNTER — Telehealth: Payer: Self-pay

## 2020-12-29 NOTE — Telephone Encounter (Signed)
Received call from Wilber Bihari, NP with [facility] wanting to help patient get re-connected to care with [place] for hepatitis C. Patient is staying at [facility] (P: [phone]). Appointments need to be made through their secretary. RN requested that Adela Lank make patient aware that our office will be trying to contact him regarding new appointment.   Sandie Ano, RN

## 2021-01-13 ENCOUNTER — Ambulatory Visit: Payer: Medicare Other | Admitting: Family

## 2021-01-24 ENCOUNTER — Encounter: Payer: Self-pay | Admitting: Infectious Diseases

## 2021-01-24 ENCOUNTER — Other Ambulatory Visit: Payer: Self-pay

## 2021-01-24 ENCOUNTER — Ambulatory Visit (INDEPENDENT_AMBULATORY_CARE_PROVIDER_SITE_OTHER): Payer: Medicare Other | Admitting: Infectious Diseases

## 2021-01-24 VITALS — BP 116/80 | HR 102 | Temp 98.0°F | Wt 185.0 lb

## 2021-01-24 DIAGNOSIS — Z114 Encounter for screening for human immunodeficiency virus [HIV]: Secondary | ICD-10-CM | POA: Diagnosis not present

## 2021-01-24 DIAGNOSIS — B182 Chronic viral hepatitis C: Secondary | ICD-10-CM | POA: Diagnosis present

## 2021-01-24 NOTE — Assessment & Plan Note (Addendum)
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 He was very hesitant to let me even get blood from him today but we were able to talk him into it.  [facility] to see what opportunities they can help with regarding direct observed therapy as I have concerns he will not take his medication consistently.  Counseled to avoid alcohol to minimize ongoing damage to liver.  He will come back in 3 weeks to see me and go over lab results and talk about medication at that time.

## 2021-01-24 NOTE — Progress Notes (Signed)
Patient Name: Anthony Solis  Date of Birth: 09-Apr-1956  MRN: [id number]  PCP: Default, Provider, MD  Referring Provider: No ref. provider found, Ph#: N/A   CC:  FU for hepatitis C infection - last seen in November 2020 with Dr. Lorenso Courier.     HPI/ROS:  Anthony Solis is a 65 y.o. male.   Since last OV he has continued to struggle with alcohol use and mental health problems / psychosis. He says that his medications do help and he takes them everyday. [facility] - does not like the staff much there. Cannot do what he wants to do there. He is not sure what exactly he takes ever day regarding other medications. He does drink some alcohol at times.   He has received his hepatitis A and B vaccines previously per our records but feels that this was treatment for hepatitis and cannot understand how he has gotten it again.    Constitutional: negative for fevers, chills, anorexia and weight loss Eyes: negative for icterus Cardiovascular: negative for chest pain, lower extremity edema Gastrointestinal: negative for abdominal pain and jaundice Musculoskeletal: negative for myalgias and stiff joints Neurological: negative for coordination problems and weakness Behavioral/Psych: positive for behavior problems, excessive alcohol consumption, illegal drug usage and mood swings All other systems reviewed and are negative      Past Medical History:  Diagnosis Date  . Drug abuse (HCC)   . GERD (gastroesophageal reflux disease)   . Hyperlipidemia   . Hypertension   . IBS (irritable bowel syndrome)   . Polysubstance abuse (HCC)   . Schizophrenia, schizo-affective type (HCC)   . Thyroid disease     Prior to Admission medications   Medication Sig Start Date End Date Taking? Authorizing Provider  benztropine (COGENTIN) 0.5 MG tablet Take 1 tablet (0.5 mg total) by mouth 2 (two) times daily. For EPS 02/18/20  Yes Nwoko, Nicole Kindred I, NP  cloZAPine (CLOZARIL) 100 MG  tablet Take 2 tablets (200 mg total) by mouth 2 (two) times daily. For mood control 02/18/20  Yes Nwoko, Nicole Kindred I, NP  dicyclomine (BENTYL) 20 MG tablet Take 20 mg by mouth 3 (three) times daily.    Yes [provider]  docusate sodium (COLACE) 100 MG capsule Take 100 mg by mouth daily.   Yes [provider]  levothyroxine (SYNTHROID, LEVOTHROID) 50 MCG tablet Take 50 mcg by mouth daily.   Yes [provider]  mirtazapine (REMERON) 30 MG tablet Take 1 tablet (30 mg total) by mouth at bedtime. For depression 02/18/20  Yes Armandina Stammer I, NP  Multiple Vitamin (MULTIVITAMIN) tablet Take 1 tablet by mouth daily.   Yes [provider]  polyethylene glycol (MIRALAX / GLYCOLAX) 17 g packet Take 17 g by mouth daily as needed for mild constipation. (May buy from over the counter): For constipation 02/18/20  Yes Nwoko, Nicole Kindred I, NP  QUEtiapine (SEROQUEL) 200 MG tablet Take 1 tablet (200 mg total) by mouth at bedtime. For mood control 02/18/20  Yes Nwoko, Nicole Kindred I, NP  QUEtiapine (SEROQUEL) 50 MG tablet Take 1 tablet (50 mg total) by mouth 2 (two) times daily. For mood control 02/18/20  Yes Nwoko, Nicole Kindred I, NP  senna (SENOKOT) 8.6 MG tablet Take 1 tablet by mouth at bedtime.   Yes [provider]  simvastatin (ZOCOR) 10 MG tablet Take 10 mg by mouth daily.   Yes [provider]  amLODipine (NORVASC) 2.5 MG tablet Take 2.5 mg by mouth  daily. 01/05/21   [provider]    No Known Allergies  Social History   Tobacco Use  . Smoking status: Current Every Day Smoker    Packs/day: 0.50    Years: 45.00    Pack years: 22.50    Types: Cigarettes, Cigars  . Smokeless tobacco: Never Used  Vaping Use  . Vaping Use: Never used  Substance Use Topics  . Alcohol use: Yes    Alcohol/week: 7.0 standard drinks    Types: 7 Glasses of wine per week    Comment: drinks wine daily per pt  . Drug use: Not Currently    Types: Marijuana, Codeine    Comment: last  inhaled cocaine was ~20 years ago    Family History  Family history unknown: Yes    Objective:   Vitals:   01/24/21 0917  BP: 116/80  Pulse: (!) 102  Temp: 98 F (36.7 C)    Constitutional: in no apparent distress and alert Eyes: anicteric Cardiovascular: Cor RRR and No murmurs Respiratory: clear Gastrointestinal: Bowel sounds are normal, liver is not enlarged, spleen is not enlarged Musculoskeletal: peripheral pulses normal, no pedal edema, no clubbing or cyanosis Skin: negative for - jaundice, spider hemangioma, telangiectasia, palmar erythema, ecchymosis and atrophy; no porphyria cutanea tarda Lymphatic: no cervical lymphadenopathy Psych: tangential speech. Pleasant overall.    Laboratory: Genotype:  Lab Results  Component Value Date   HCVGENOTYPE 1b 05/27/2019   HCV viral load: No results found for: HCVQUANT Lab Results  Component Value Date   WBC 5.8 02/11/2020   HGB 14.3 02/11/2020   HCT 44.0 02/11/2020   MCV 94.8 02/11/2020   PLT 141 (L) 02/11/2020    Lab Results  Component Value Date   CREATININE 0.98 02/16/2020   BUN 13 02/16/2020   NA 142 02/16/2020   K 4.7 02/16/2020   CL 106 02/16/2020   CO2 25 02/16/2020    Lab Results  Component Value Date   ALT 63 (H) 02/16/2020   AST 44 (H) 02/16/2020   ALKPHOS 49 02/16/2020    Lab Results  Component Value Date   BILITOT 0.4 02/16/2020   ALBUMIN 3.9 02/16/2020     Imaging:  2018 - normal appearing liver.     Assessment & Plan:   Problem List Items Addressed This Visit      Unprioritized   Chronic hepatitis C without hepatic coma (HCC) - Primary    Will repeat labs for PA of medications including FibroTest to stage liver disease as he has failed to follow up with abdominal ultrasounds in the past. He was very hesitant to let me even get blood from him today but we were able to talk him into it.  [facility] will [facility] to see what opportunities they can help with regarding  direct observed therapy as I have concerns he will not take his medication consistently.  Counseled to avoid alcohol to minimize ongoing damage to liver.  He will come back in 3 weeks to see me and go over lab results and talk about medication at that time.      Relevant Orders   Hepatitis C RNA quantitative ([facility])   Liver Fibrosis, FibroTest-ActiTest   Comprehensive metabolic panel   CBC   Protime-INR    Other Visit Diagnoses    Encounter for screening for HIV       Relevant Orders   HIV Antibody (routine testing w rflx)      Rexene Alberts, MSN, NP-C  [email or web address] [email or web address] Pager: [phone] Office: [phone] [place]: [phone]

## 2021-01-24 NOTE — Patient Instructions (Addendum)
Nice to meet you today.   Please stop by the lab on your way out to get your blood work drawn so we can know what you need.   Please come back in 3 weeks so we can go over your results and talk about treatment for you.   Please bring with you a list of your medications you take everyday so we can make sure nothing interacts.

## 2021-01-28 LAB — CBC
HCT: 43.5 % (ref 38.5–50.0)
Hemoglobin: 14.8 g/dL (ref 13.2–17.1)
MCH: 30.9 pg (ref 27.0–33.0)
MCHC: 34 g/dL (ref 32.0–36.0)
MCV: 90.8 fL (ref 80.0–100.0)
MPV: 11.9 fL (ref 7.5–12.5)
Platelets: 148 10*3/uL (ref 140–400)
RBC: 4.79 10*6/uL (ref 4.20–5.80)
RDW: 12.9 % (ref 11.0–15.0)
WBC: 4.7 10*3/uL (ref 3.8–10.8)

## 2021-01-28 LAB — LIVER FIBROSIS, FIBROTEST-ACTITEST
ALT: 62 U/L — ABNORMAL HIGH (ref 9–46)
Alpha-2-Macroglobulin: 328 mg/dL — ABNORMAL HIGH (ref 106–279)
Apolipoprotein A1: 159 mg/dL (ref 94–176)
Bilirubin: 0.4 mg/dL (ref 0.2–1.2)
Fibrosis Score: 0.84
GGT: 57 U/L (ref 3–70)
Haptoglobin: <8 mg/dL — ABNORMAL LOW (ref 43–212)
Necroinflammat ACT Score: 0.57
Reference ID: 3899196

## 2021-01-28 LAB — COMPREHENSIVE METABOLIC PANEL
AG Ratio: 2.1 (calc) (ref 1.0–2.5)
ALT: 59 U/L — ABNORMAL HIGH (ref 9–46)
AST: 43 U/L — ABNORMAL HIGH (ref 10–35)
Albumin: 4.4 g/dL (ref 3.6–5.1)
Alkaline phosphatase (APISO): 49 U/L (ref 35–144)
BUN: 12 mg/dL (ref 7–25)
CO2: 27 mmol/L (ref 20–32)
Calcium: 9.7 mg/dL (ref 8.6–10.3)
Chloride: 105 mmol/L (ref 98–110)
Creat: 1.13 mg/dL (ref 0.70–1.25)
Globulin: 2.1 g/dL (calc) (ref 1.9–3.7)
Glucose, Bld: 97 mg/dL (ref 65–99)
Potassium: 4 mmol/L (ref 3.5–5.3)
Sodium: 141 mmol/L (ref 135–146)
Total Bilirubin: 0.4 mg/dL (ref 0.2–1.2)
Total Protein: 6.5 g/dL (ref 6.1–8.1)

## 2021-01-28 LAB — HEPATITIS C RNA QUANTITATIVE
HCV Quantitative Log: 6.47 log IU/mL — ABNORMAL HIGH
HCV RNA, PCR, QN: 2920000 IU/mL — ABNORMAL HIGH

## 2021-01-28 LAB — HIV ANTIBODY (ROUTINE TESTING W REFLEX): HIV 1&2 Ab, 4th Generation: NONREACTIVE

## 2021-01-28 LAB — PROTIME-INR
INR: 1
Prothrombin Time: 10.4 s (ref 9.0–11.5)

## 2021-02-11 NOTE — Progress Notes (Signed)
Patient Name: Anthony Solis  Date of Birth: 14-Sep-1955  MRN: [id number]  PCP: Default, Provider, MD  Referring Provider: No ref. provider found, Ph#: N/A   CC:  FU for hepatitis C infection - lab review and plan of treatment discussion     HPI/ROS:  He is frustrated he is here today. Does not understand why he did not get his medication started yet.  Wants to see a male provider.  Refusing abdominal ultrasound.   In the interval I have spoken to his primary care team Ara Kussmaul, NP and she verified to me that he has all medication administrations under DOT.    Review of Systems     Past Medical History:  Diagnosis Date   Drug abuse (HCC)    GERD (gastroesophageal reflux disease)    Hyperlipidemia    Hypertension    IBS (irritable bowel syndrome)    Polysubstance abuse (HCC)    Schizophrenia, schizo-affective type (HCC)    Thyroid disease     Prior to Admission medications   Medication Sig Start Date End Date Taking? Authorizing Provider  benztropine (COGENTIN) 0.5 MG tablet Take 1 tablet (0.5 mg total) by mouth 2 (two) times daily. For EPS 02/18/20  Yes Nwoko, Nicole Kindred I, NP  cloZAPine (CLOZARIL) 100 MG tablet Take 2 tablets (200 mg total) by mouth 2 (two) times daily. For mood control 02/18/20  Yes Nwoko, Nicole Kindred I, NP  dicyclomine (BENTYL) 20 MG tablet Take 20 mg by mouth 3 (three) times daily.    Yes [provider]  docusate sodium (COLACE) 100 MG capsule Take 100 mg by mouth daily.   Yes [provider]  levothyroxine (SYNTHROID, LEVOTHROID) 50 MCG tablet Take 50 mcg by mouth daily.   Yes [provider]  mirtazapine (REMERON) 30 MG tablet Take 1 tablet (30 mg total) by mouth at bedtime. For depression 02/18/20  Yes Armandina Stammer I, NP  Multiple Vitamin (MULTIVITAMIN) tablet Take 1 tablet by mouth daily.   Yes [provider]  polyethylene glycol (MIRALAX / GLYCOLAX) 17 g packet Take 17 g by mouth daily as needed for mild  constipation. (May buy from over the counter): For constipation 02/18/20  Yes Nwoko, Nicole Kindred I, NP  QUEtiapine (SEROQUEL) 200 MG tablet Take 1 tablet (200 mg total) by mouth at bedtime. For mood control 02/18/20  Yes Nwoko, Nicole Kindred I, NP  QUEtiapine (SEROQUEL) 50 MG tablet Take 1 tablet (50 mg total) by mouth 2 (two) times daily. For mood control 02/18/20  Yes Nwoko, Nicole Kindred I, NP  senna (SENOKOT) 8.6 MG tablet Take 1 tablet by mouth at bedtime.   Yes [provider]  simvastatin (ZOCOR) 10 MG tablet Take 10 mg by mouth daily.   Yes [provider]  amLODipine (NORVASC) 2.5 MG tablet Take 2.5 mg by mouth daily. 01/05/21   [provider]    No Known Allergies  Social History   Tobacco Use   Smoking status: Every Day    Packs/day: 0.50    Years: 45.00    Pack years: 22.50    Types: Cigarettes, Cigars   Smokeless tobacco: Never  Vaping Use   Vaping Use: Never used  Substance Use Topics   Alcohol use: Yes    Alcohol/week: 7.0 standard drinks    Types: 7 Glasses of wine per week    Comment: drinks wine daily per pt   Drug use: Not Currently    Types: Marijuana, Codeine    Comment: last inhaled cocaine  was ~20 years ago    Family History  Family history unknown: Yes    Objective:   There were no vitals filed for this visit.   Physical Exam   Laboratory: Genotype:  Lab Results  Component Value Date   HCVGENOTYPE 1b 05/27/2019   HCV viral load: No results found for: HCVQUANT Lab Results  Component Value Date   WBC 4.7 01/24/2021   HGB 14.8 01/24/2021   HCT 43.5 01/24/2021   MCV 90.8 01/24/2021   PLT 148 01/24/2021    Lab Results  Component Value Date   CREATININE 1.13 01/24/2021   BUN 12 01/24/2021   NA 141 01/24/2021   K 4.0 01/24/2021   CL 105 01/24/2021   CO2 27 01/24/2021    Lab Results  Component Value Date   ALT 62 (H) 01/24/2021   ALT 59 (H) 01/24/2021   AST 43 (H) 01/24/2021   GGT 57 01/24/2021   ALKPHOS 49 02/16/2020    Lab  Results  Component Value Date   INR 1.0 01/24/2021   BILITOT 0.4 01/24/2021   ALBUMIN 3.9 02/16/2020     Imaging:  2018 - normal appearing liver.     Assessment & Plan:   Problem List Items Addressed This Visit       Unprioritized   Schizophrenia (HCC)    Any changes to his psychiatric medications we will need to be notified to review for drug interactions with hepatitis C medications.        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  Will try to arrange liver ultrasound now for [facility] screen. With no concern for decompensated liver disease will proceed with Epclusa x 12 weeks for treatment.  Does not appear to have any other DDI's with psych meds, but will review with Cassie and Information systems manager.   Start PA for epclusa x 12 weeks. RTC in 5 weeks to assess tolerance. I have d/w his PCP at the facility and she verifies that this will be given under direct observation everyday. Will need to hold multivitamin during treatment.          Rexene Alberts, MSN, NP-C [facility] for Infectious Disease [facility]  [email or web address] Pager: [phone] Office: [phone] [facility] Main Line: [phone]

## 2021-02-11 NOTE — Assessment & Plan Note (Addendum)
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  I recommended a liver ultrasound now for [facility] screen, however he has adamantly declined.  With no concern for decompensated liver disease will proceed with Epclusa x 12 weeks for treatment. He is agreeable to taking treatment.  Does not appear to have any other DDI's with psych meds, but will review with Cassie and Information systems manager.   Start PA for epclusa x 12 weeks. RTC in 5 weeks to assess tolerance. I have d/w his PCP at the facility and she verifies that this will be given under direct observation everyday. Will need to hold multivitamin during treatment.   FU in 6 weeks with male provider.

## 2021-02-11 NOTE — Assessment & Plan Note (Signed)
Any changes to his psychiatric medications we will need to be notified to review for drug interactions with hepatitis C medications.

## 2021-02-14 ENCOUNTER — Ambulatory Visit (INDEPENDENT_AMBULATORY_CARE_PROVIDER_SITE_OTHER): Payer: Medicare Other | Admitting: Infectious Diseases

## 2021-02-14 ENCOUNTER — Other Ambulatory Visit (HOSPITAL_COMMUNITY): Payer: Self-pay

## 2021-02-14 ENCOUNTER — Telehealth: Payer: Self-pay

## 2021-02-14 ENCOUNTER — Other Ambulatory Visit: Payer: Self-pay | Admitting: Pharmacist

## 2021-02-14 ENCOUNTER — Other Ambulatory Visit: Payer: Self-pay

## 2021-02-14 DIAGNOSIS — B182 Chronic viral hepatitis C: Secondary | ICD-10-CM

## 2021-02-14 DIAGNOSIS — F203 Undifferentiated schizophrenia: Secondary | ICD-10-CM

## 2021-02-14 MED ORDER — SOFOSBUVIR-VELPATASVIR 400-100 MG PO TABS
1.0000 | ORAL_TABLET | Freq: Every day | ORAL | 2 refills | Status: DC
  Filled 2021-02-14 – 2021-02-15 (×2): qty 28, 28d supply, fill #0
  Filled 2021-03-11: qty 28, 28d supply, fill #1
  Filled 2021-04-14: qty 28, 28d supply, fill #2

## 2021-02-14 MED ORDER — SOFOSBUVIR-VELPATASVIR 400-100 MG PO TABS: 1.0000 | ORAL_TABLET | Freq: Every day | ORAL | 2 refills | Status: DC

## 2021-02-14 NOTE — Telephone Encounter (Signed)
[facility] Patient Advocate Encounter   Received notification from [facility] that prior authorization for Anthony Solis  is required.   PA submitted on 02/14/21 Key [id number] Status is pending    [facility] will continue to follow.   Clearance Coots, CPhT Specialty Pharmacy Patient [facility] for Infectious Disease Phone: [phone] Fax:  [phone]

## 2021-02-14 NOTE — Telephone Encounter (Signed)
[facility] Patient Advocate Encounter  Prior Authorization for Dorita Fray has been approved.    Effective dates: 08/21/20 through 05/09/21  Patients co-pay is $4.00.   [facility] will continue to follow.  Clearance Coots, CPhT Specialty Pharmacy Patient [facility] for Infectious Disease Phone: [phone] Fax:  [phone]

## 2021-02-14 NOTE — Patient Instructions (Addendum)
Will send in the information to your insurance to get the process started on your new medication -   EPCLUSA -  Epclusa Instructions:  Take Epclusa, one tablet daily with or without food. You should take it at approximately the same time every day. Your treatment will be for 12 weeks. Do not miss a dose.    Do not run out of Epclusa! If you are down to one week of medication left and have not heard about your next shipment, please let us know as soon as possible. You will be given 28 days of medication at a time and provided with 2 refills.   If you need to start a new medication, prescription from your doctor or over the counter medication, you need to contact us to make sure it does not interfere with Epclusa. There are several medications that can interfere with Epclusa and can make you sick or make the medication not work.  If you need to take a medication for acid reflux, your choices are as follows: TUMS or Rolaids separated at least four hours from [place].  Pepcid (famotidine) 40mg  up to twice per day administered with Epclusa or 12 hours apart from .    Tylenol (acetominophen) and Advil (ibuprofen) are safe to take with Epclusa if needed for headache, fever, pain.   DO NOT stop Epclusa unless instructed to by your provider. If you are hospitalized while taking this medication please bring it with you to the hospital to avoid interruption of therapy. Every pill is important!  The most common side effects associated with Epclusa  include:  Fatigue Headache Nausea Diarrhea Insomnia     Please come back to see one of our other doctors in 6 weeks (Dr. [place]). We will see how things are going then and make sure you are not having side effects to your medications.   I would recommend a liver ultrasound now and every year at minimum for liver cancer screening.  I encourage you to talk about this with your regular doctor.

## 2021-02-15 ENCOUNTER — Other Ambulatory Visit (HOSPITAL_COMMUNITY): Payer: Self-pay

## 2021-03-04 ENCOUNTER — Other Ambulatory Visit (HOSPITAL_COMMUNITY): Payer: Self-pay

## 2021-03-04 ENCOUNTER — Telehealth: Payer: Self-pay

## 2021-03-04 NOTE — Telephone Encounter (Signed)
error 

## 2021-03-08 ENCOUNTER — Other Ambulatory Visit (HOSPITAL_COMMUNITY): Payer: Self-pay

## 2021-03-11 ENCOUNTER — Other Ambulatory Visit (HOSPITAL_COMMUNITY): Payer: Self-pay

## 2021-03-17 ENCOUNTER — Other Ambulatory Visit (HOSPITAL_COMMUNITY): Payer: Self-pay

## 2021-03-30 ENCOUNTER — Ambulatory Visit: Payer: Medicare Other | Admitting: Internal Medicine

## 2021-04-14 ENCOUNTER — Other Ambulatory Visit (HOSPITAL_COMMUNITY): Payer: Self-pay

## 2021-05-09 ENCOUNTER — Other Ambulatory Visit (HOSPITAL_COMMUNITY): Payer: Self-pay

## 2021-05-16 ENCOUNTER — Other Ambulatory Visit (HOSPITAL_COMMUNITY): Payer: Self-pay

## 2021-05-19 ENCOUNTER — Ambulatory Visit: Payer: Medicare Other | Admitting: Infectious Diseases

## 2021-05-30 ENCOUNTER — Ambulatory Visit: Payer: Medicare Other | Admitting: Family

## 2021-06-07 ENCOUNTER — Ambulatory Visit: Payer: Medicare Other | Admitting: Infectious Diseases

## 2021-07-06 ENCOUNTER — Other Ambulatory Visit (HOSPITAL_COMMUNITY): Payer: Self-pay

## 2021-07-14 IMAGING — MR MRI HEAD WITHOUT CONTRAST
9 of 10 series · 38 of 48 positions shown · non-contrast
Comparison: Prior CT from 03/11/2019.

CLINICAL DATA: Initial evaluation for acute altered mental status.

EXAM:
MRI HEAD WITHOUT CONTRAST
TECHNIQUE: Multiplanar, multiecho pulse sequences of the brain and surrounding
structures were obtained without intravenous contrast.

[Series 3: T1 · sagittal · 5.0mm · 0.47mm/px · 2 of 21 slices shown (1 of 2)]
[im 1/21]
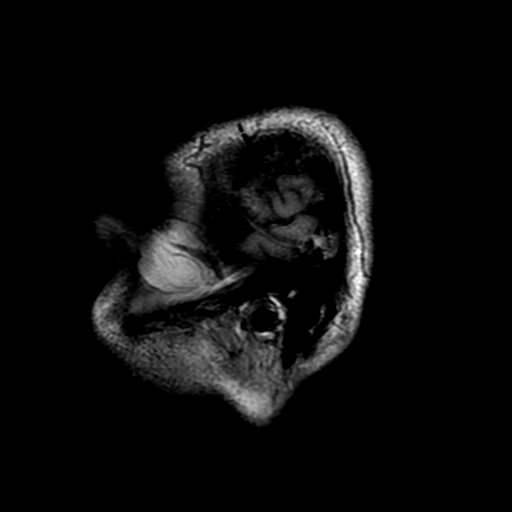
[im 21/21]
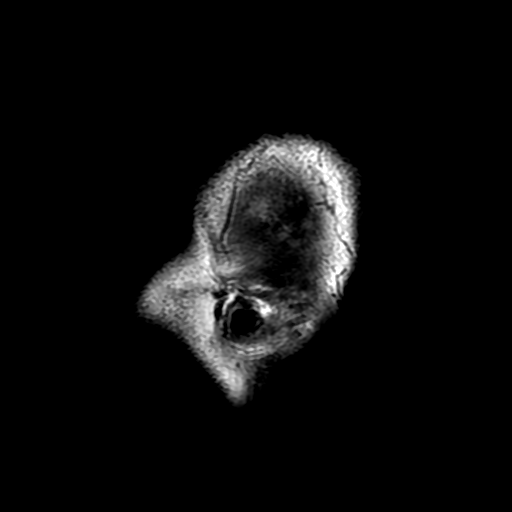

[Series 4: DWI · axial · 3.0mm · 1.09mm/px · z∈[-46,+104]mm · 9 of 102 slices shown (1 of 4)]
[im 1/102]
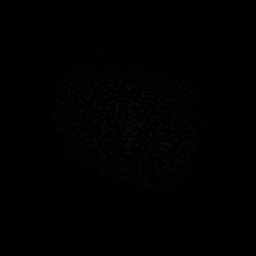
[im 13/102]
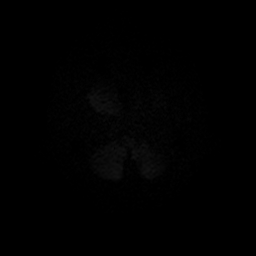
[im 26/102]
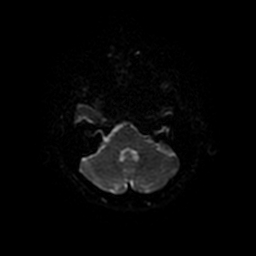
[im 38/102]
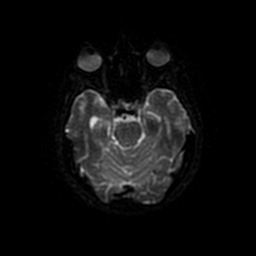
[im 51/102]
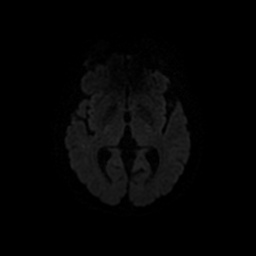
[im 64/102]
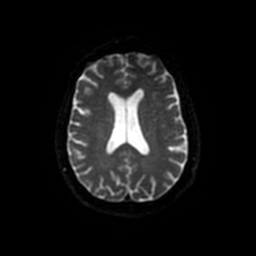
[im 76/102]
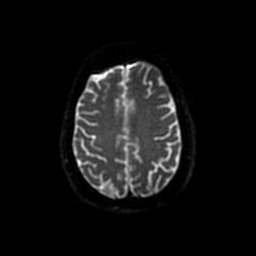
[im 89/102]
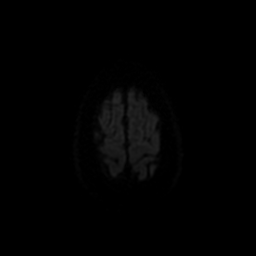
[im 102/102]
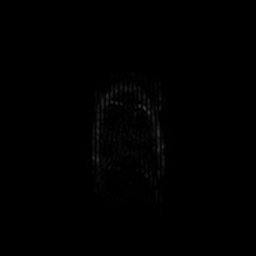

[Series 5: T2 · axial · 5.0mm · 0.86mm/px · z∈[-47,+107]mm · 2 of 23 slices shown (1 of 2)]
[im 1/23]
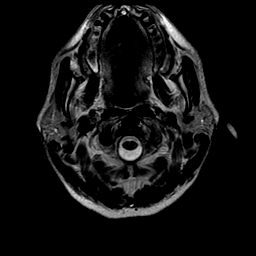
[im 23/23]
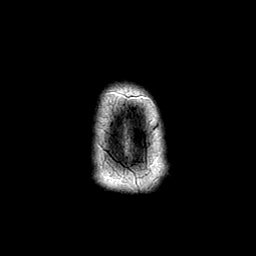

[Series 6: FLAIR · axial · 3.0mm · 0.43mm/px · z∈[-48,+108]mm · 2 of 27 slices shown]
[im 1/27]
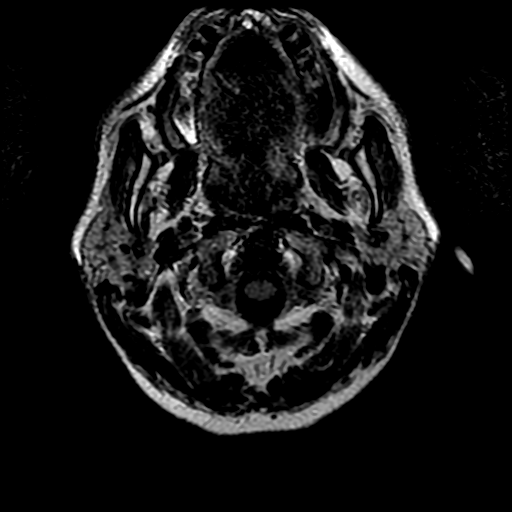
[im 27/27]
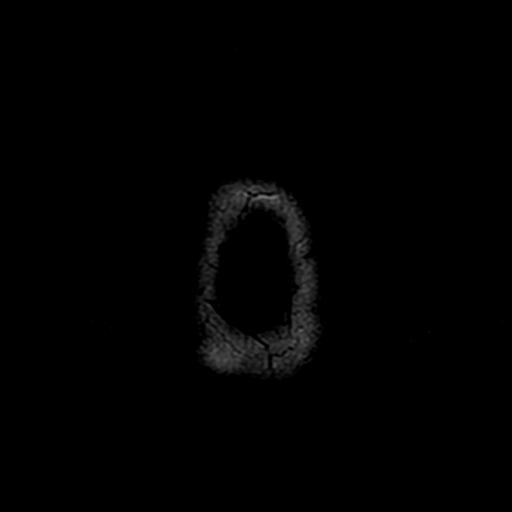

[Series 8: T1 · axial · 1.0mm · 0.47mm/px · z∈[-45,+51]mm · 6 of 146 slices shown (2 of 2)]
[im 1/146]
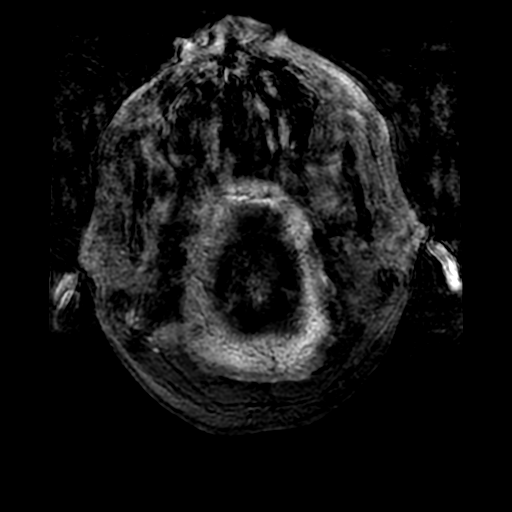
[im 25/146]
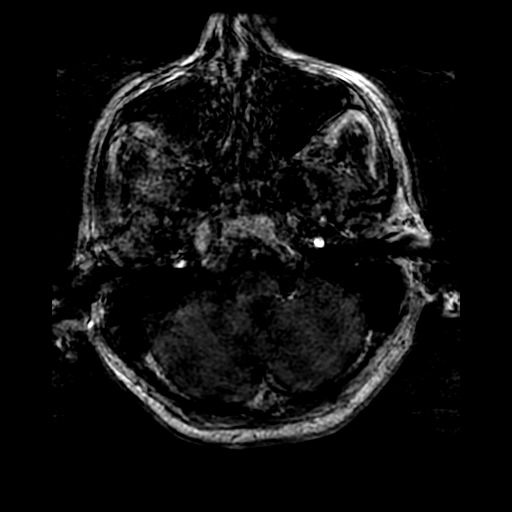
[im 49/146]
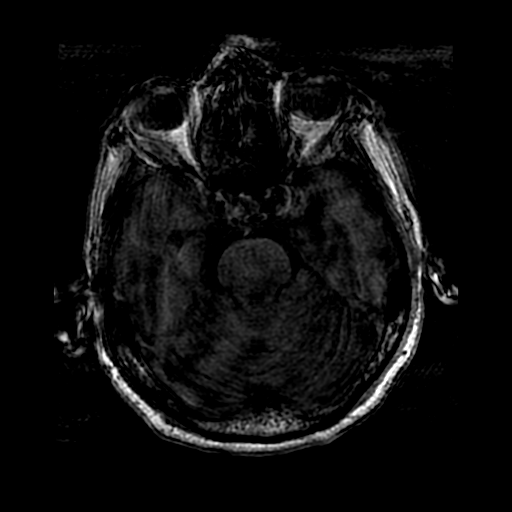
[im 61/146]
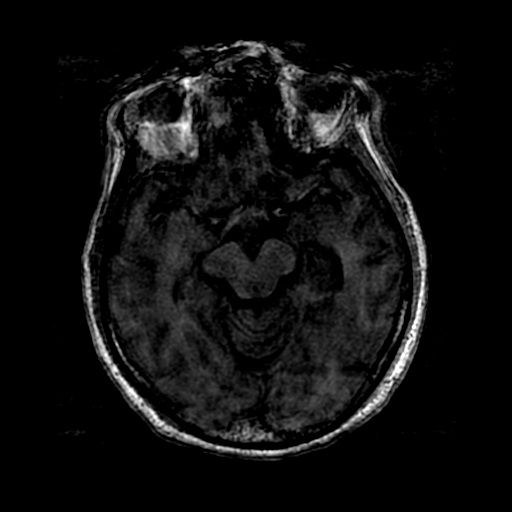
[im 85/146]
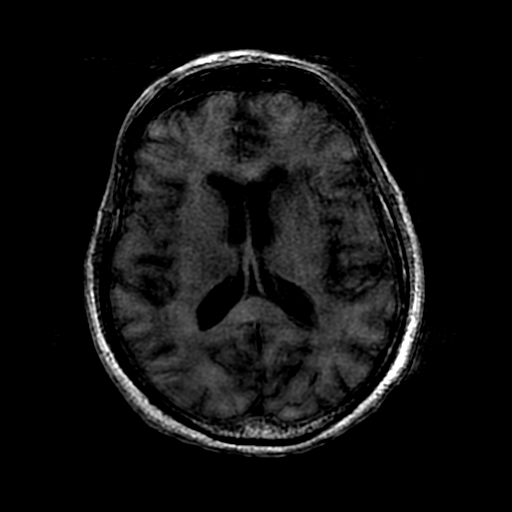
[im 97/146]
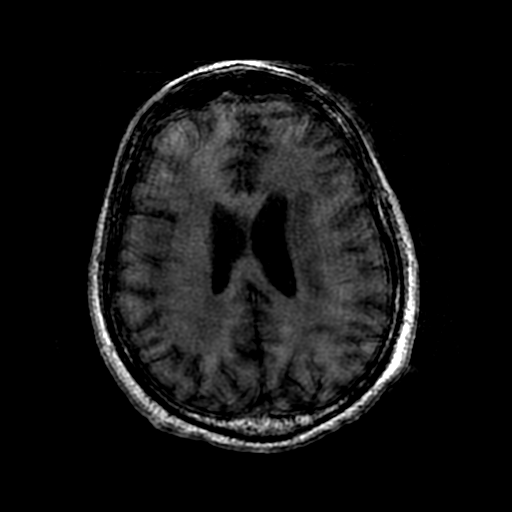

[Series 9: DWI · coronal · 4.0mm · 1.09mm/px · 7 of 82 slices shown (2 of 4)]
[im 1/82]
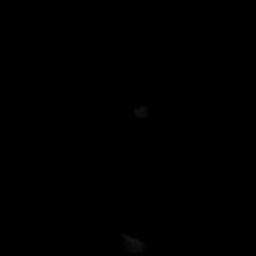
[im 14/82]
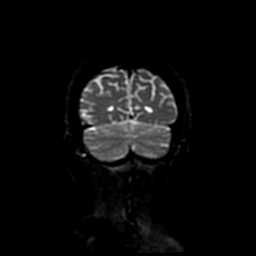
[im 28/82]
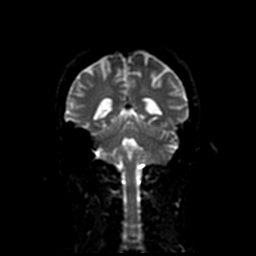
[im 41/82]
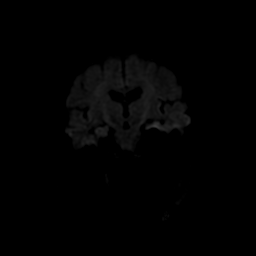
[im 55/82]
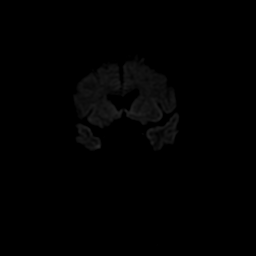
[im 68/82]
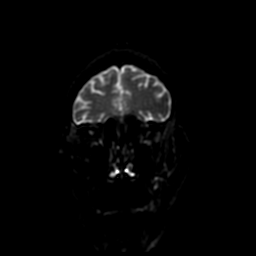
[im 82/82]
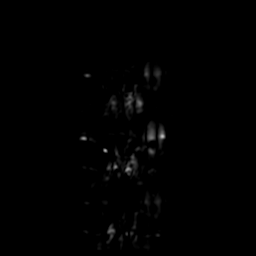

[Series 10: T2 · coronal · 5.0mm · 0.90mm/px · 2 of 26 slices shown (2 of 2)]
[im 1/26]
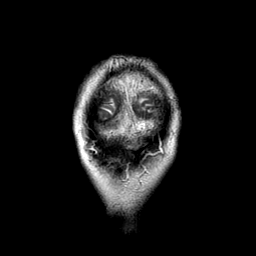
[im 26/26]
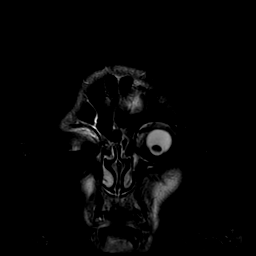

[Series 400: DWI · axial · 3.0mm · 1.09mm/px · z∈[-46,+104]mm · 4 of 51 slices shown (3 of 4)]
[im 1/51]
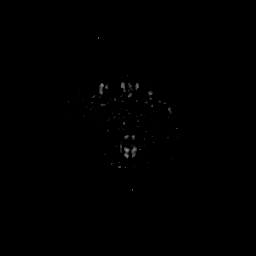
[im 17/51]
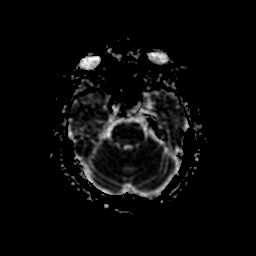
[im 34/51]
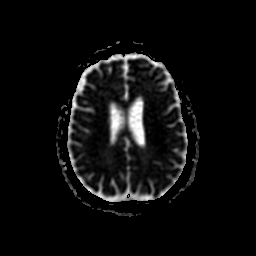
[im 51/51]
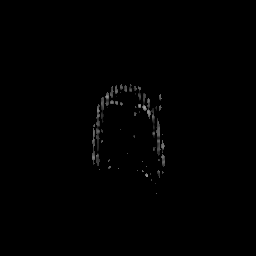

[Series 900: DWI · coronal · 4.0mm · 1.09mm/px · 4 of 41 slices shown (4 of 4)]
[im 1/41]
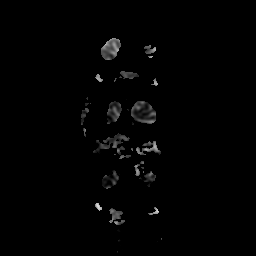
[im 14/41]
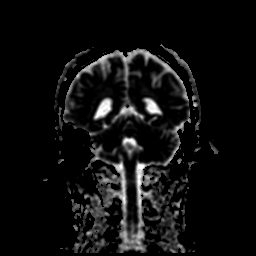
[im 27/41]
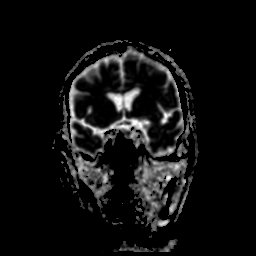
[im 41/41]
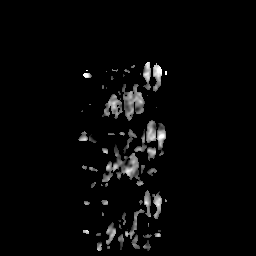

[38 of 48 positions shown; findings below may reference images not displayed]

FINDINGS: Brain: Cerebral volume within normal limits for age. Few scattered
subcentimeter T2/FLAIR hyperintensities noted within the
periventricular deep white matter both cerebral hemispheres,
nonspecific, but felt to be within normal limits for age.

No abnormal foci of restricted diffusion to suggest acute or
subacute ischemia. Gray-white matter differentiation maintained. No
areas of remote cortical infarction. No foci of susceptibility
artifact to suggest acute or chronic intracranial hemorrhage.

No mass lesion, midline shift or mass effect. No hydrocephalus. No
extra-axial fluid collection. Pituitary gland suprasellar region
within normal limits. Midline structures intact.

Vascular: Major intracranial vascular flow voids are maintained.

Skull and upper cervical spine: Craniocervical junction within
normal limits. Upper cervical spine normal. Bone marrow signal
intensity within normal limits. No scalp soft tissue abnormality.

Sinuses/Orbits: Globes and orbital soft tissues within normal
limits. Mild scattered mucosal thickening throughout the ethmoidal
air cells. Paranasal sinuses are otherwise clear. No mastoid
effusion. Inner ear structures normal.

Other: None.
IMPRESSION: Normal brain MRI for age. No acute intracranial abnormality
identified.

## 2022-08-22 ENCOUNTER — Other Ambulatory Visit: Payer: Self-pay

## 2022-08-22 ENCOUNTER — Emergency Department (HOSPITAL_COMMUNITY)
Admission: EM | Admit: 2022-08-22 | Discharge: 2022-08-23 | Disposition: A | Discharge status: Other Acute Inpatient Hospital | Payer: Medicare HMO | Attending: Emergency Medicine | Admitting: Emergency Medicine

## 2022-08-22 ENCOUNTER — Encounter (HOSPITAL_COMMUNITY): Payer: Self-pay | Admitting: Emergency Medicine

## 2022-08-22 DIAGNOSIS — Z79899 Other long term (current) drug therapy: Secondary | ICD-10-CM | POA: Insufficient documentation

## 2022-08-22 DIAGNOSIS — Z046 Encounter for general psychiatric examination, requested by authority: Secondary | ICD-10-CM | POA: Diagnosis present

## 2022-08-22 DIAGNOSIS — F209 Schizophrenia, unspecified: Secondary | ICD-10-CM | POA: Insufficient documentation

## 2022-08-22 DIAGNOSIS — R443 Hallucinations, unspecified: Secondary | ICD-10-CM | POA: Insufficient documentation

## 2022-08-22 DIAGNOSIS — F17210 Nicotine dependence, cigarettes, uncomplicated: Secondary | ICD-10-CM | POA: Diagnosis not present

## 2022-08-22 DIAGNOSIS — I10 Essential (primary) hypertension: Secondary | ICD-10-CM | POA: Insufficient documentation

## 2022-08-22 DIAGNOSIS — F29 Unspecified psychosis not due to a substance or known physiological condition: Secondary | ICD-10-CM | POA: Diagnosis not present

## 2022-08-22 DIAGNOSIS — Z1152 Encounter for screening for COVID-19: Secondary | ICD-10-CM | POA: Diagnosis not present

## 2022-08-22 DIAGNOSIS — E039 Hypothyroidism, unspecified: Secondary | ICD-10-CM | POA: Insufficient documentation

## 2022-08-22 LAB — RESP PANEL BY RT-PCR (RSV, FLU A&B, COVID)  RVPGX2
Influenza A by PCR: NEGATIVE
Influenza B by PCR: NEGATIVE
Resp Syncytial Virus by PCR: NEGATIVE
SARS Coronavirus 2 by RT PCR: NEGATIVE

## 2022-08-22 LAB — RAPID URINE DRUG SCREEN, HOSP PERFORMED
Amphetamines: NOT DETECTED
Barbiturates: NOT DETECTED
Benzodiazepines: NOT DETECTED
Cocaine: NOT DETECTED
Opiates: NOT DETECTED
Tetrahydrocannabinol: NOT DETECTED

## 2022-08-22 LAB — CBC WITH DIFFERENTIAL/PLATELET
Abs Immature Granulocytes: 0.05 10*3/uL (ref 0.00–0.07)
Basophils Absolute: 0.1 10*3/uL (ref 0.0–0.1)
Basophils Relative: 1 %
Eosinophils Absolute: 0.2 10*3/uL (ref 0.0–0.5)
Eosinophils Relative: 2 %
HCT: 40.4 % (ref 39.0–52.0)
Hemoglobin: 13.6 g/dL (ref 13.0–17.0)
Immature Granulocytes: 1 %
Lymphocytes Relative: 18 %
Lymphs Abs: 1.8 10*3/uL (ref 0.7–4.0)
MCH: 30.6 pg (ref 26.0–34.0)
MCHC: 33.7 g/dL (ref 30.0–36.0)
MCV: 90.8 fL (ref 80.0–100.0)
Monocytes Absolute: 1 10*3/uL (ref 0.1–1.0)
Monocytes Relative: 10 %
Neutro Abs: 7.2 10*3/uL (ref 1.7–7.7)
Neutrophils Relative %: 68 %
Platelets: 203 10*3/uL (ref 150–400)
RBC: 4.45 MIL/uL (ref 4.22–5.81)
RDW: 13.8 % (ref 11.5–15.5)
WBC: 10.3 10*3/uL (ref 4.0–10.5)
nRBC: 0 % (ref 0.0–0.2)

## 2022-08-22 LAB — COMPREHENSIVE METABOLIC PANEL
ALT: 23 U/L (ref 0–44)
AST: 43 U/L — ABNORMAL HIGH (ref 15–41)
Albumin: 4.4 g/dL (ref 3.5–5.0)
Alkaline Phosphatase: 53 U/L (ref 38–126)
Anion gap: 14 (ref 5–15)
BUN: 21 mg/dL (ref 8–23)
CO2: 21 mmol/L — ABNORMAL LOW (ref 22–32)
Calcium: 9.9 mg/dL (ref 8.9–10.3)
Chloride: 103 mmol/L (ref 98–111)
Creatinine, Ser: 1.14 mg/dL (ref 0.61–1.24)
GFR, Estimated: >60 mL/min (ref >60)
Glucose, Bld: 80 mg/dL (ref 70–99)
Potassium: 4.1 mmol/L (ref 3.5–5.1)
Sodium: 138 mmol/L (ref 135–145)
Total Bilirubin: 1.1 mg/dL (ref 0.3–1.2)
Total Protein: 6.8 g/dL (ref 6.5–8.1)

## 2022-08-22 LAB — ACETAMINOPHEN LEVEL: Acetaminophen (Tylenol), Serum: <10 ug/mL — ABNORMAL LOW (ref 10–30)

## 2022-08-22 LAB — SALICYLATE LEVEL: Salicylate Lvl: <7 mg/dL — ABNORMAL LOW (ref 7.0–30.0)

## 2022-08-22 LAB — ETHANOL: Alcohol, Ethyl (B): <10 mg/dL (ref <10)

## 2022-08-22 MED ORDER — ZIPRASIDONE MESYLATE 20 MG IM SOLR
20.0000 mg | Freq: Once | INTRAMUSCULAR | Status: AC
  Administered 2022-08-22: 20 mg via INTRAMUSCULAR
  Filled 2022-08-22: qty 20

## 2022-08-22 MED ORDER — STERILE WATER FOR INJECTION IJ SOLN
INTRAMUSCULAR | Status: AC
  Administered 2022-08-22: 10 mL
  Filled 2022-08-22: qty 10

## 2022-08-22 MED ORDER — STERILE WATER FOR INJECTION IJ SOLN
INTRAMUSCULAR | Status: AC
  Filled 2022-08-22: qty 10

## 2022-08-22 MED ORDER — LORAZEPAM 2 MG/ML IJ SOLN
2.0000 mg | Freq: Once | INTRAMUSCULAR | Status: AC
  Administered 2022-08-22: 2 mg via INTRAMUSCULAR
  Filled 2022-08-22: qty 1
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
## 2022-08-22 NOTE — ED Notes (Signed)
Pt attempted to exit through triage door. Pt redirect back to room. Pt stated " I want to be safe, I don't want to stay in the hospital or my room. I want to walk around and talk to people". RN explained the rules for the unit again with numerous interruptions with the pt stating " I don't want to stay here".

## 2022-08-22 NOTE — ED Triage Notes (Deleted)
Pt is refusing to get labs done.

## 2022-08-22 NOTE — ED Notes (Signed)
Pt belonging in labeled patient bag in locker 4

## 2022-08-22 NOTE — ED Notes (Signed)
Pt sitting on stretcher looking at door, no distress noted. No complaints verbalized

## 2022-08-22 NOTE — ED Notes (Signed)
Pt was urinating in sink. Patient was redirected to use rest room, continued to urinate in sink and scrub bottoms. Clean purple scrubs placed on patient and patient an floor was cleaned.
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
## 2022-08-22 NOTE — ED Notes (Signed)
IVC Paperwork is completed and placed with the blue zone Network engineer.

## 2022-08-22 NOTE — ED Notes (Signed)
Pt urinated himself while sleeping. Pt washed up and changed into clean scrubs.

## 2022-08-22 NOTE — ED Notes (Signed)
MD at bedside. 

## 2022-08-22 NOTE — Progress Notes (Signed)
Pt was accepted to [facility] 08/23/22; [place]  Pt meets inpatient criteria per Vesta Mixer, NP  Attending Physician will be Dr. Eugenio Hoes  Report can be called to: [phone]  Pt can arrive after 8:00am  Care Team notified: Carolin Coy, RN  Nadara Mode, [place] 08/22/2022 @ 5:58 PM

## 2022-08-22 NOTE — Discharge Instructions (Addendum)
It was a pleasure caring for you today in the emergency department. ° °Please return to the emergency department for any worsening or worrisome symptoms. ° ° °

## 2022-08-22 NOTE — ED Notes (Signed)
Pt is refusing labs.
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
## 2022-08-22 NOTE — ED Notes (Signed)
Patient continues to ramble and refuses to stay in the bed.  Patient is confused and has poor attention span.

## 2022-08-22 NOTE — ED Notes (Signed)
Patient belongings placed in locker 4  

## 2022-08-22 NOTE — ED Notes (Signed)
2nd attempt of patient to leave unit through triage door. Redirect back to room by RN. Pt becoming more disruptive.

## 2022-08-22 NOTE — ED Notes (Signed)
Pt getting more agitated and argumentative.

## 2022-08-23 NOTE — ED Notes (Signed)
Attempted x 2 to call report to [facility] w/ phone number provided by CSW. Call went straight to saying "Please press 1 to leave a callback number or please press 2 to leave a message".
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
## 2022-08-23 NOTE — ED Notes (Signed)
Sheriff's officer called and reports they will be here in about 10 mins. Still waiting on [facility] to call me back for report.

## 2022-08-23 NOTE — ED Notes (Signed)
IVC'd 08/22/22, exp 08/29/22, docs in purple zone

## 2022-08-23 NOTE — ED Notes (Signed)
Called main number for [facility] and was transferred to the "paging line". They reported that they could transfer me to admissions, but the paging line would be the quickest way for them to get back to me. I explained that transport is on the way here to transport the pt to them and I need to give report. Left a message and phone number on paging line and now awaiting a call back to give report.

## 2022-08-29 ENCOUNTER — Other Ambulatory Visit (HOSPITAL_COMMUNITY): Payer: Self-pay

## 2023-05-05 ENCOUNTER — Emergency Department (HOSPITAL_COMMUNITY): Payer: Medicare HMO

## 2023-05-05 ENCOUNTER — Inpatient Hospital Stay (HOSPITAL_COMMUNITY)
Admission: EM | Admit: 2023-05-05 | Discharge: 2023-05-09 | DRG: 871 | Disposition: A | Discharge status: Hospice | Payer: Medicare HMO | Attending: Internal Medicine | Admitting: Internal Medicine

## 2023-05-05 DIAGNOSIS — G9341 Metabolic encephalopathy: Secondary | ICD-10-CM | POA: Diagnosis present

## 2023-05-05 DIAGNOSIS — R6520 Severe sepsis without septic shock: Secondary | ICD-10-CM | POA: Diagnosis present

## 2023-05-05 DIAGNOSIS — E279 Disorder of adrenal gland, unspecified: Secondary | ICD-10-CM | POA: Diagnosis not present

## 2023-05-05 DIAGNOSIS — C259 Malignant neoplasm of pancreas, unspecified: Secondary | ICD-10-CM | POA: Diagnosis present

## 2023-05-05 DIAGNOSIS — Z7189 Other specified counseling: Secondary | ICD-10-CM | POA: Diagnosis not present

## 2023-05-05 DIAGNOSIS — J9601 Acute respiratory failure with hypoxia: Secondary | ICD-10-CM | POA: Diagnosis present

## 2023-05-05 DIAGNOSIS — F1090 Alcohol use, unspecified, uncomplicated: Secondary | ICD-10-CM

## 2023-05-05 DIAGNOSIS — E878 Other disorders of electrolyte and fluid balance, not elsewhere classified: Secondary | ICD-10-CM | POA: Diagnosis present

## 2023-05-05 DIAGNOSIS — Z720 Tobacco use: Secondary | ICD-10-CM

## 2023-05-05 DIAGNOSIS — E785 Hyperlipidemia, unspecified: Secondary | ICD-10-CM | POA: Diagnosis present

## 2023-05-05 DIAGNOSIS — B182 Chronic viral hepatitis C: Secondary | ICD-10-CM | POA: Diagnosis present

## 2023-05-05 DIAGNOSIS — C7972 Secondary malignant neoplasm of left adrenal gland: Secondary | ICD-10-CM | POA: Diagnosis present

## 2023-05-05 DIAGNOSIS — J690 Pneumonitis due to inhalation of food and vomit: Secondary | ICD-10-CM | POA: Diagnosis present

## 2023-05-05 DIAGNOSIS — F32A Depression, unspecified: Secondary | ICD-10-CM | POA: Diagnosis present

## 2023-05-05 DIAGNOSIS — D630 Anemia in neoplastic disease: Secondary | ICD-10-CM | POA: Diagnosis present

## 2023-05-05 DIAGNOSIS — K219 Gastro-esophageal reflux disease without esophagitis: Secondary | ICD-10-CM | POA: Diagnosis present

## 2023-05-05 DIAGNOSIS — R7989 Other specified abnormal findings of blood chemistry: Secondary | ICD-10-CM | POA: Diagnosis present

## 2023-05-05 DIAGNOSIS — A419 Sepsis, unspecified organism: Secondary | ICD-10-CM | POA: Diagnosis not present

## 2023-05-05 DIAGNOSIS — K831 Obstruction of bile duct: Secondary | ICD-10-CM | POA: Diagnosis present

## 2023-05-05 DIAGNOSIS — R54 Age-related physical debility: Secondary | ICD-10-CM | POA: Diagnosis present

## 2023-05-05 DIAGNOSIS — Z560 Unemployment, unspecified: Secondary | ICD-10-CM

## 2023-05-05 DIAGNOSIS — D696 Thrombocytopenia, unspecified: Secondary | ICD-10-CM

## 2023-05-05 DIAGNOSIS — K869 Disease of pancreas, unspecified: Secondary | ICD-10-CM | POA: Diagnosis not present

## 2023-05-05 DIAGNOSIS — E039 Hypothyroidism, unspecified: Secondary | ICD-10-CM | POA: Diagnosis present

## 2023-05-05 DIAGNOSIS — F209 Schizophrenia, unspecified: Secondary | ICD-10-CM | POA: Diagnosis present

## 2023-05-05 DIAGNOSIS — J189 Pneumonia, unspecified organism: Secondary | ICD-10-CM | POA: Diagnosis present

## 2023-05-05 DIAGNOSIS — Z66 Do not resuscitate: Secondary | ICD-10-CM | POA: Diagnosis not present

## 2023-05-05 DIAGNOSIS — I10 Essential (primary) hypertension: Secondary | ICD-10-CM | POA: Diagnosis present

## 2023-05-05 DIAGNOSIS — Z1152 Encounter for screening for COVID-19: Secondary | ICD-10-CM

## 2023-05-05 DIAGNOSIS — K839 Disease of biliary tract, unspecified: Secondary | ICD-10-CM

## 2023-05-05 DIAGNOSIS — E872 Acidosis, unspecified: Secondary | ICD-10-CM | POA: Diagnosis present

## 2023-05-05 DIAGNOSIS — K8689 Other specified diseases of pancreas: Principal | ICD-10-CM

## 2023-05-05 DIAGNOSIS — E871 Hypo-osmolality and hyponatremia: Secondary | ICD-10-CM | POA: Diagnosis present

## 2023-05-05 DIAGNOSIS — K589 Irritable bowel syndrome without diarrhea: Secondary | ICD-10-CM | POA: Diagnosis present

## 2023-05-05 DIAGNOSIS — D7389 Other diseases of spleen: Secondary | ICD-10-CM | POA: Diagnosis not present

## 2023-05-05 DIAGNOSIS — Z79899 Other long term (current) drug therapy: Secondary | ICD-10-CM

## 2023-05-05 DIAGNOSIS — Z515 Encounter for palliative care: Secondary | ICD-10-CM

## 2023-05-05 DIAGNOSIS — R4189 Other symptoms and signs involving cognitive functions and awareness: Secondary | ICD-10-CM | POA: Diagnosis present

## 2023-05-05 DIAGNOSIS — C7889 Secondary malignant neoplasm of other digestive organs: Secondary | ICD-10-CM | POA: Diagnosis present

## 2023-05-05 DIAGNOSIS — D6959 Other secondary thrombocytopenia: Secondary | ICD-10-CM | POA: Diagnosis present

## 2023-05-05 DIAGNOSIS — M6250 Muscle wasting and atrophy, not elsewhere classified, unspecified site: Secondary | ICD-10-CM | POA: Diagnosis present

## 2023-05-05 DIAGNOSIS — F17290 Nicotine dependence, other tobacco product, uncomplicated: Secondary | ICD-10-CM | POA: Diagnosis present

## 2023-05-05 DIAGNOSIS — E861 Hypovolemia: Secondary | ICD-10-CM | POA: Diagnosis present

## 2023-05-05 DIAGNOSIS — F17210 Nicotine dependence, cigarettes, uncomplicated: Secondary | ICD-10-CM | POA: Diagnosis present

## 2023-05-05 DIAGNOSIS — E860 Dehydration: Secondary | ICD-10-CM | POA: Diagnosis present

## 2023-05-05 DIAGNOSIS — Z79890 Hormone replacement therapy (postmenopausal): Secondary | ICD-10-CM

## 2023-05-05 LAB — CBC WITH DIFFERENTIAL/PLATELET
Abs Immature Granulocytes: 1.85 10*3/uL — ABNORMAL HIGH (ref 0.00–0.07)
Basophils Absolute: 0.1 10*3/uL (ref 0.0–0.1)
Basophils Relative: 0 %
Eosinophils Absolute: 0.1 10*3/uL (ref 0.0–0.5)
Eosinophils Relative: 1 %
HCT: 34 % — ABNORMAL LOW (ref 39.0–52.0)
Hemoglobin: 11.6 g/dL — ABNORMAL LOW (ref 13.0–17.0)
Immature Granulocytes: 12 %
Lymphocytes Relative: 21 %
Lymphs Abs: 3.1 10*3/uL (ref 0.7–4.0)
MCH: 30.1 pg (ref 26.0–34.0)
MCHC: 34.1 g/dL (ref 30.0–36.0)
MCV: 88.1 fL (ref 80.0–100.0)
Monocytes Absolute: 1 10*3/uL (ref 0.1–1.0)
Monocytes Relative: 6 %
Neutro Abs: 8.9 10*3/uL — ABNORMAL HIGH (ref 1.7–7.7)
Neutrophils Relative %: 60 %
Platelets: 19 10*3/uL — CL (ref 150–400)
RBC: 3.86 MIL/uL — ABNORMAL LOW (ref 4.22–5.81)
RDW: 14.8 % (ref 11.5–15.5)
WBC: 14.9 10*3/uL — ABNORMAL HIGH (ref 4.0–10.5)
nRBC: 0.5 % — ABNORMAL HIGH (ref 0.0–0.2)

## 2023-05-05 LAB — BLOOD GAS, VENOUS
Acid-Base Excess: 2.2 mmol/L — ABNORMAL HIGH (ref 0.0–2.0)
Bicarbonate: 26.5 mmol/L (ref 20.0–28.0)
O2 Saturation: 21.6 %
Patient temperature: 37
pCO2, Ven: 39 mmHg — ABNORMAL LOW (ref 44–60)
pH, Ven: 7.44 — ABNORMAL HIGH (ref 7.25–7.43)
pO2, Ven: <31 mmHg — CL (ref 32–45)

## 2023-05-05 LAB — COMPREHENSIVE METABOLIC PANEL
ALT: 184 U/L — ABNORMAL HIGH (ref 0–44)
AST: 273 U/L — ABNORMAL HIGH (ref 15–41)
Albumin: 2.8 g/dL — ABNORMAL LOW (ref 3.5–5.0)
Alkaline Phosphatase: 690 U/L — ABNORMAL HIGH (ref 38–126)
Anion gap: 13 (ref 5–15)
BUN: 21 mg/dL (ref 8–23)
CO2: 23 mmol/L (ref 22–32)
Calcium: 9 mg/dL (ref 8.9–10.3)
Chloride: 89 mmol/L — ABNORMAL LOW (ref 98–111)
Creatinine, Ser: 0.46 mg/dL — ABNORMAL LOW (ref 0.61–1.24)
GFR, Estimated: >60 mL/min (ref >60)
Glucose, Bld: 114 mg/dL — ABNORMAL HIGH (ref 70–99)
Potassium: 4.1 mmol/L (ref 3.5–5.1)
Sodium: 125 mmol/L — ABNORMAL LOW (ref 135–145)
Total Bilirubin: 19.5 mg/dL (ref 0.3–1.2)
Total Protein: 6.9 g/dL (ref 6.5–8.1)

## 2023-05-05 LAB — TROPONIN I (HIGH SENSITIVITY)
Troponin I (High Sensitivity): 12 ng/L (ref <18)
Troponin I (High Sensitivity): 11 ng/L (ref <18)

## 2023-05-05 LAB — RESP PANEL BY RT-PCR (RSV, FLU A&B, COVID)  RVPGX2
Influenza A by PCR: NEGATIVE
Influenza B by PCR: NEGATIVE
Resp Syncytial Virus by PCR: NEGATIVE
SARS Coronavirus 2 by RT PCR: NEGATIVE

## 2023-05-05 LAB — BRAIN NATRIURETIC PEPTIDE: B Natriuretic Peptide: 31.6 pg/mL (ref 0.0–100.0)

## 2023-05-05 LAB — I-STAT CG4 LACTIC ACID, ED
Lactic Acid, Venous: 3.2 mmol/L (ref 0.5–1.9)
Lactic Acid, Venous: 3.1 mmol/L (ref 0.5–1.9)

## 2023-05-05 LAB — HIV ANTIBODY (ROUTINE TESTING W REFLEX): HIV Screen 4th Generation wRfx: NONREACTIVE

## 2023-05-05 LAB — ABO/RH: ABO/RH(D): A POS

## 2023-05-05 LAB — AMMONIA: Ammonia: 42 umol/L — ABNORMAL HIGH (ref 9–35)

## 2023-05-05 MED ORDER — SODIUM CHLORIDE 0.9 % IV SOLN: INTRAVENOUS | Status: DC

## 2023-05-05 MED ORDER — SODIUM CHLORIDE 0.9 % IV SOLN
1.0000 g | INTRAVENOUS | Status: DC
  Administered 2023-05-06 – 2023-05-07 (×2): 1 g via INTRAVENOUS
  Filled 2023-05-05 (×3): qty 10

## 2023-05-05 MED ORDER — IOHEXOL 300 MG/ML  SOLN
100.0000 mL | Freq: Once | INTRAMUSCULAR | Status: AC | PRN
  Administered 2023-05-05: 100 mL via INTRAVENOUS

## 2023-05-05 MED ORDER — LACTULOSE 10 GM/15ML PO SOLN
30.0000 g | Freq: Once | ORAL | Status: AC
  Administered 2023-05-05: 30 g via ORAL
  Filled 2023-05-05: qty 60

## 2023-05-05 MED ORDER — SODIUM CHLORIDE 0.9 % IV BOLUS
1000.0000 mL | Freq: Once | INTRAVENOUS | Status: AC
  Administered 2023-05-05: 1000 mL via INTRAVENOUS

## 2023-05-05 MED ORDER — TRAMADOL HCL 50 MG PO TABS: 50.0000 mg | ORAL_TABLET | Freq: Three times a day (TID) | ORAL | Status: DC | PRN

## 2023-05-05 MED ORDER — SODIUM CHLORIDE 0.9 % IV SOLN
2.0000 g | Freq: Once | INTRAVENOUS | Status: AC
  Administered 2023-05-05: 2 g via INTRAVENOUS
  Filled 2023-05-05: qty 20

## 2023-05-05 MED ORDER — TRAZODONE HCL 50 MG PO TABS: 25.0000 mg | ORAL_TABLET | Freq: Every evening | ORAL | Status: DC | PRN

## 2023-05-05 MED ORDER — LACTATED RINGERS IV BOLUS
1000.0000 mL | Freq: Once | INTRAVENOUS | Status: AC
  Administered 2023-05-05: 1000 mL via INTRAVENOUS

## 2023-05-05 MED ORDER — ALBUTEROL SULFATE (2.5 MG/3ML) 0.083% IN NEBU: 2.5000 mg | INHALATION_SOLUTION | RESPIRATORY_TRACT | Status: DC | PRN

## 2023-05-05 MED ORDER — SODIUM CHLORIDE 0.9% IV SOLUTION: Freq: Once | INTRAVENOUS | Status: AC

## 2023-05-05 MED ORDER — IPRATROPIUM-ALBUTEROL 0.5-2.5 (3) MG/3ML IN SOLN
3.0000 mL | Freq: Once | RESPIRATORY_TRACT | Status: AC
  Administered 2023-05-05: 3 mL via RESPIRATORY_TRACT
  Filled 2023-05-05: qty 3

## 2023-05-05 MED ORDER — ONDANSETRON HCL 4 MG/2ML IJ SOLN: 4.0000 mg | Freq: Four times a day (QID) | INTRAMUSCULAR | Status: DC | PRN

## 2023-05-05 MED ORDER — SODIUM CHLORIDE 0.9 % IV SOLN
500.0000 mg | INTRAVENOUS | Status: DC
  Administered 2023-05-05 – 2023-05-07 (×3): 500 mg via INTRAVENOUS
  Filled 2023-05-05 (×3): qty 5

## 2023-05-05 MED ORDER — ONDANSETRON HCL 4 MG PO TABS: 4.0000 mg | ORAL_TABLET | Freq: Four times a day (QID) | ORAL | Status: DC | PRN

## 2023-05-05 NOTE — ED Notes (Signed)
Patient refused a second set of blood cultures. Staff attempted to educate him on the need of a second set, but patient still refused. Staff will try again.

## 2023-05-05 NOTE — ED Triage Notes (Signed)
Pt BIBA from [facility] for generalized weakness starting possibly today after lunch. Pt unable to articulate additional, states he feels bad. Disoriented to time and some situation. Denies pain. Stroke screen negative. Pt has yellow sclera and tint to skin. Difficulty ambulating today  120/80 93% 2L HR 106 CBG 201

## 2023-05-05 NOTE — ED Provider Notes (Signed)
[place] EMERGENCY DEPARTMENT AT [facility] Provider Note  CSN: [id number] Arrival date & time: 05/05/23 1303  Chief Complaint(s) No chief complaint on file.  HPI Anthony Solis is a 67 y.o. male with PMH polysubstance abuse, HTN, HLD, schizophrenia who presents emerged department for evaluation of altered mental status and generalized weakness.  Patient is reportedly staying in some sort of "enrichment house" where he called out to EMS because he was "feeling bad".  Patient is altered here in the emergency department and is alert to self only.  He arrives significantly jaundiced with scleral icterus and jaundice of the skin.  Denies alcohol or substance use today.  Does endorse cigarette use.  Additional history unable to be obtained as the patient is currently altered.   Past Medical History Past Medical History:  Diagnosis Date   Drug abuse (HCC)    GERD (gastroesophageal reflux disease)    Hyperlipidemia    Hypertension    IBS (irritable bowel syndrome)    Polysubstance abuse (HCC)    Schizophrenia, schizo-affective type (HCC)    Thyroid disease    Patient Active Problem List   Diagnosis Date Noted   Hallucinations 08/22/2022   Hypokalemia 03/13/2019   HTN (hypertension) 03/13/2019   Elevated CK 03/13/2019   Elevated liver enzymes 03/13/2019   Hyperbilirubinemia    Hypothyroidism 12/03/2016   Schizophrenia (HCC) 12/03/2016   Syncope 12/03/2016   Hyponatremia 12/03/2016   Orthostatic hypotension 12/03/2016   Chronic hepatitis C without hepatic coma (HCC) 2018   Home Medication(s) Prior to Admission medications   Medication Sig Start Date End Date Taking? Authorizing Provider  amLODipine (NORVASC) 2.5 MG tablet Take 2.5 mg by mouth daily. 01/05/21   [provider]  benztropine (COGENTIN) 0.5 MG tablet Take 1 tablet (0.5 mg total) by mouth 2 (two) times daily. For EPS 02/18/20   Armandina Stammer I, NP  cloZAPine (CLOZARIL) 100 MG tablet Take 2 tablets  (200 mg total) by mouth 2 (two) times daily. For mood control Patient taking differently: Take 200-300 mg by mouth 2 (two) times daily. Take 3 tablets (300 mg) in the morning and 2 tablets (200 mg) at bedtime. 02/18/20   Armandina Stammer I, NP  dicyclomine (BENTYL) 20 MG tablet Take 20 mg by mouth 3 (three) times daily.     [provider]  docusate sodium (COLACE) 100 MG capsule Take 100 mg by mouth daily.    [provider]  fenofibrate (TRICOR) 48 MG tablet Take 48 mg by mouth daily.    [provider]  haloperidol (HALDOL) 2 MG/ML solution Take 4 mg by mouth as needed for agitation.    [provider]  levothyroxine (SYNTHROID, LEVOTHROID) 50 MCG tablet Take 50 mcg by mouth daily.    [provider]  mirtazapine (REMERON) 30 MG tablet Take 1 tablet (30 mg total) by mouth at bedtime. For depression 02/18/20   Armandina Stammer I, NP  Multiple Vitamin (MULTIVITAMIN) tablet Take 1 tablet by mouth daily.    [provider]  NONFORMULARY OR COMPOUNDED ITEM Apply 1 mL topically as needed (Agitation). Compounded Ativan/Benadryl/Haldol 2mg -25mg -2mg /mL    [provider]  polyethylene glycol (MIRALAX / GLYCOLAX) 17 g packet Take 17 g by mouth daily as needed for mild constipation. (May buy from over the counter): For constipation Patient not taking: Reported on 08/22/2022 02/18/20   Armandina Stammer I, NP  QUEtiapine (SEROQUEL) 200 MG tablet Take 1 tablet (200 mg total) by mouth at bedtime. For mood  control 02/18/20   Armandina Stammer I, NP  QUEtiapine (SEROQUEL) 300 MG tablet Take 300 mg by mouth at bedtime.    [provider]  QUEtiapine (SEROQUEL) 50 MG tablet Take 1 tablet (50 mg total) by mouth 2 (two) times daily. For mood control Patient not taking: Reported on 08/22/2022 02/18/20   Armandina Stammer I, NP  senna (SENOKOT) 8.6 MG tablet Take 1 tablet by mouth at bedtime.    [provider]  simvastatin (ZOCOR) 10 MG tablet Take 10 mg by mouth  daily.    [provider]  Sofosbuvir-Velpatasvir (EPCLUSA) 400-100 MG TABS Take 1 tablet by mouth daily. Patient not taking: Reported on 08/22/2022 02/14/21   Kuppelweiser, Cherylann Ratel, RPH-CPP                                                                                                                                    Past Surgical History No past surgical history on file. Family History Family History  Family history unknown: Yes    Social History Social History   Tobacco Use   Smoking status: Every Day    Current packs/day: 0.50    Average packs/day: 0.5 packs/day for 45.0 years (22.5 ttl pk-yrs)    Types: Cigarettes, Cigars   Smokeless tobacco: Never  Vaping Use   Vaping status: Never Used  Substance Use Topics   Alcohol use: Yes    Alcohol/week: 7.0 standard drinks of alcohol    Types: 7 Glasses of wine per week    Comment: drinks wine daily per pt   Drug use: Not Currently    Types: Marijuana, Codeine    Comment: last inhaled cocaine was ~20 years ago   Allergies Patient has no known allergies.  Review of Systems Review of Systems  Unable to perform ROS: Mental status change    Physical Exam Vital Signs  I have reviewed the triage vital signs BP (!) 140/91 (BP Location: Right Arm)   Pulse (!) 105   Temp 100 F (37.8 C) (Oral)   Resp 20   SpO2 92%   Physical Exam Constitutional:      General: He is not in acute distress.    Appearance: Normal appearance.  HENT:     Head: Normocephalic and atraumatic.     Nose: No congestion or rhinorrhea.  Eyes:     General: Scleral icterus present.        Right eye: No discharge.        Left eye: No discharge.     Extraocular Movements: Extraocular movements intact.     Pupils: Pupils are equal, round, and reactive to light.  Cardiovascular:     Rate and Rhythm: Normal rate and regular rhythm.     Heart sounds: No murmur heard. Pulmonary:     Effort: No respiratory distress.     Breath sounds: Wheezing  present. No rales.  Abdominal:  General: There is no distension.     Tenderness: There is no abdominal tenderness.  Musculoskeletal:        General: Normal range of motion.     Cervical back: Normal range of motion.  Skin:    General: Skin is warm and dry.     Coloration: Skin is jaundiced.  Neurological:     General: No focal deficit present.     Mental Status: He is alert.     ED Results and Treatments Labs (all labs ordered are listed, but only abnormal results are displayed) Labs Reviewed  CULTURE, BLOOD (ROUTINE X 2)  CULTURE, BLOOD (ROUTINE X 2)  COMPREHENSIVE METABOLIC PANEL  CBC WITH DIFFERENTIAL/PLATELET  BRAIN NATRIURETIC PEPTIDE  BLOOD GAS, VENOUS  URINALYSIS, ROUTINE W REFLEX MICROSCOPIC  I-STAT CG4 LACTIC ACID, ED  TROPONIN I (HIGH SENSITIVITY)                                                                                                                          Radiology No results found.  Pertinent labs & imaging results that were available during my care of the patient were reviewed by me and considered in my medical decision making (see MDM for details).  Medications Ordered in ED Medications  ipratropium-albuterol (DUONEB) 0.5-2.5 (3) MG/3ML nebulizer solution 3 mL (has no administration in time range)                                                                                                                                     Procedures .Critical Care  Performed by: Glendora Score, MD Authorized by: Glendora Score, MD   Critical care provider statement:    Critical care time (minutes):  30   Critical care was necessary to treat or prevent imminent or life-threatening deterioration of the following conditions: Critical thrombocytopenia requiring transfusion.   Critical care was time spent personally by me on the following activities:  Development of treatment plan with patient or surrogate, discussions with consultants, evaluation of  patient's response to treatment, examination of patient, ordering and review of laboratory studies, ordering and review of radiographic studies, ordering and performing treatments and interventions, pulse oximetry, re-evaluation of patient's condition and review of old charts   (including critical care time)  Medical Decision Making / ED Course   This patient presents to the ED for concern of malaise, jaundice, this involves an extensive  number of treatment options, and is a complaint that carries with it a high risk of complications and morbidity.  The differential diagnosis includes pancreatic malignancy, biliary obstruction, hemolytic anemia, cholecystitis, choledocholithiasis  MDM: Seen emergency room for evaluation of malaise and jaundice.  Physical exam reveals an altered patient with severe scleral icterus and jaundice of the skin.  Laboratory evaluation is concerning with a leukocytosis to 14.9, hemoglobin 11.6, thrombocytopenia to 19, hyponatremia to 125, hypochloremia to 89, AST 273, ALT 184, alk phos 690, total bili 19.5.  COVID, flu, RSV negative.  High-sensitivity troponin negative.  Initial chest x-ray with patchy infiltrative disease on the right and patient started on ceftriaxone.  Follow-up CT chest abdomen pelvis concerning for pancreatic malignancy, adrenal mass, multilobar right-sided pneumonia, severe biliary dilatation, severe constipation.  Spoke with Dr. Dulce Sellar of gastroenterology about possible biliary stenting and patient will need to have a platelet count greater than 50 before this procedure could be done.  Type and screen sent and 1 pooled platelets ordered for the patient.  Spoke to Dr. Myna Hidalgo of hematology oncology who is recommending repeat CBC and he will come to evaluate the patient.  Patient then admitted to the hospitalist   Additional history obtained:  -External records from outside source obtained and reviewed including: Chart review including previous notes,  labs, imaging, consultation notes   Lab Tests: -I ordered, reviewed, and interpreted labs.   The pertinent results include:   Labs Reviewed  CULTURE, BLOOD (ROUTINE X 2)  CULTURE, BLOOD (ROUTINE X 2)  COMPREHENSIVE METABOLIC PANEL  CBC WITH DIFFERENTIAL/PLATELET  BRAIN NATRIURETIC PEPTIDE  BLOOD GAS, VENOUS  URINALYSIS, ROUTINE W REFLEX MICROSCOPIC  I-STAT CG4 LACTIC ACID, ED  TROPONIN I (HIGH SENSITIVITY)       Imaging Studies ordered: I ordered imaging studies including CT chest abdomen pelvis, right quadrant ultrasound, chest x-ray I independently visualized and interpreted imaging. I agree with the radiologist interpretation   Medicines ordered and prescription drug management: Meds ordered this encounter  Medications   ipratropium-albuterol (DUONEB) 0.5-2.5 (3) MG/3ML nebulizer solution 3 mL    -I have reviewed the patients home medicines and have made adjustments as needed  Critical interventions Platelet transfusion, multiple consultations  Consultations Obtained: I requested consultation with the gastroenterologist on-call Dr. Dulce Sellar, hematologist on-call Dr. Myna Hidalgo,  and discussed lab and imaging findings as well as pertinent plan - they recommend: Repeat CBC, platelet transfusion   Cardiac Monitoring: The patient was maintained on a cardiac monitor.  I personally viewed and interpreted the cardiac monitored which showed an underlying rhythm of: NSR  Social Determinants of Health:  Factors impacting patients care include: none   Reevaluation: After the interventions noted above, I reevaluated the patient and found that they have :stayed the same  Co morbidities that complicate the patient evaluation  Past Medical History:  Diagnosis Date   Drug abuse (HCC)    GERD (gastroesophageal reflux disease)    Hyperlipidemia    Hypertension    IBS (irritable bowel syndrome)    Polysubstance abuse (HCC)    Schizophrenia, schizo-affective type (HCC)     Thyroid disease       Dispostion: I considered admission for this patient, and given critical thrombocytopenia, new pancreatic malignancy patient require hospital admission     Final Clinical Impression(s) / ED Diagnoses Final diagnoses:  None     @PCDICTATION @    Glendora Score, MD 05/05/23 [phone]

## 2023-05-05 NOTE — H&P (Signed)
History and Physical  Anthony Solis ZOX:[id number] DOB: 1955/09/02 DOA: 05/05/2023  PCP: Default, Provider, MD   Chief Complaint: Not feeling well  HPI: Kidus Bestul is a 67 y.o. male with medical history significant for polysubstance abuse, schizophrenia presented to the emergency department from the living facility where he has been with since 1998, with complaints of not feeling well.  He is oriented only to self, unable to provide me any further details.  Denies nausea or vomiting, says that he may have had some weight loss.  He thinks the year is 2025.  Tells me that he takes some prescription medications, when asked why he takes them for states that they " help his body state right."  Patient was noted to be significantly jaundiced, so labs and imaging were performed.  He has low-grade fever in the emergency department 100 F, heart rate 105, blood pressure normal saturating well on room air.  Lab work was done, demonstrates WBC 15, hemoglobin 12, platelets 19, sodium 125, creatinine 0.46, alk phos 690, AST 237, ALT 184, ammonia 42, total bilirubin 19.5.  CT scan as detailed below shows concern for pancreatic adenocarcinoma with possible splenic metastasis.  CT scan also demonstrates evidence of multifocal pneumonia.  Patient also specifically denies any cough or shortness of breath.  Review of Systems: Please see HPI for pertinent positives and negatives, due to the patient's mental status complete review of systems could not be performed.  Past Medical History:  Diagnosis Date   Drug abuse (HCC)    GERD (gastroesophageal reflux disease)    Hyperlipidemia    Hypertension    IBS (irritable bowel syndrome)    Polysubstance abuse (HCC)    Schizophrenia, schizo-affective type (HCC)    Thyroid disease    No past surgical history on file.  Social History:  reports that he has been smoking cigarettes and cigars. He has a 22.5 pack-year smoking history. He has never used smokeless  tobacco. He reports current alcohol use of about 7.0 standard drinks of alcohol per week. He reports that he does not currently use drugs after having used the following drugs: Marijuana and Codeine.   No Known Allergies  Family History  Family history unknown: Yes     Prior to Admission medications   Medication Sig Start Date End Date Taking? Authorizing Provider  amLODipine (NORVASC) 2.5 MG tablet Take 2.5 mg by mouth daily. 01/05/21   [provider]  benztropine (COGENTIN) 0.5 MG tablet Take 1 tablet (0.5 mg total) by mouth 2 (two) times daily. For EPS 02/18/20   Armandina Stammer I, NP  cloZAPine (CLOZARIL) 100 MG tablet Take 2 tablets (200 mg total) by mouth 2 (two) times daily. For mood control Patient taking differently: Take 200-300 mg by mouth 2 (two) times daily. Take 3 tablets (300 mg) in the morning and 2 tablets (200 mg) at bedtime. 02/18/20   Armandina Stammer I, NP  dicyclomine (BENTYL) 20 MG tablet Take 20 mg by mouth 3 (three) times daily.     [provider]  docusate sodium (COLACE) 100 MG capsule Take 100 mg by mouth daily.    [provider]  fenofibrate (TRICOR) 48 MG tablet Take 48 mg by mouth daily.    [provider]  haloperidol (HALDOL) 2 MG/ML solution Take 4 mg by mouth as needed for agitation.    [provider]  levothyroxine (SYNTHROID, LEVOTHROID) 50 MCG tablet Take 50 mcg by mouth daily.    [provider]  mirtazapine (  REMERON) 30 MG tablet Take 1 tablet (30 mg total) by mouth at bedtime. For depression 02/18/20   Armandina Stammer I, NP  Multiple Vitamin (MULTIVITAMIN) tablet Take 1 tablet by mouth daily.    [provider]  NONFORMULARY OR COMPOUNDED ITEM Apply 1 mL topically as needed (Agitation). Compounded Ativan/Benadryl/Haldol 2mg -25mg -2mg /mL    [provider]  polyethylene glycol (MIRALAX / GLYCOLAX) 17 g packet Take 17 g by mouth daily as needed for mild constipation. (May buy from over the  counter): For constipation Patient not taking: Reported on 08/22/2022 02/18/20   Armandina Stammer I, NP  QUEtiapine (SEROQUEL) 200 MG tablet Take 1 tablet (200 mg total) by mouth at bedtime. For mood control 02/18/20   Armandina Stammer I, NP  QUEtiapine (SEROQUEL) 300 MG tablet Take 300 mg by mouth at bedtime.    [provider]  QUEtiapine (SEROQUEL) 50 MG tablet Take 1 tablet (50 mg total) by mouth 2 (two) times daily. For mood control Patient not taking: Reported on 08/22/2022 02/18/20   Armandina Stammer I, NP  senna (SENOKOT) 8.6 MG tablet Take 1 tablet by mouth at bedtime.    [provider]  simvastatin (ZOCOR) 10 MG tablet Take 10 mg by mouth daily.    [provider]  Sofosbuvir-Velpatasvir (EPCLUSA) 400-100 MG TABS Take 1 tablet by mouth daily. Patient not taking: Reported on 08/22/2022 02/14/21   Kuppelweiser, Cherylann Ratel, RPH-CPP    Physical Exam: BP 119/82 (BP Location: Right Arm)   Pulse (!) 105   Temp 100 F (37.8 C) (Oral)   Resp 16   SpO2 97%   General: But disoriented, he looks clinically dry on exam Eyes: EOMI, clear conjuctivae, icteric sclerea Neck: supple, no masses, trachea mildline  Cardiovascular: Borderline tachycardic and regular, no murmurs or rubs, no peripheral edema  Respiratory: clear to auscultation bilaterally, no wheezes, no crackles  Abdomen: soft, and versus subjective tenderness, very slightly distended, normal bowel tones heard  Skin: dry, no rashes  Musculoskeletal: no joint effusions, normal range of motion  Psychiatric: appropriate affect, normal speech  Neurologic: extraocular muscles intact, clear speech, moving all extremities with intact sensorium patient is a         Labs on Admission:  Basic Metabolic Panel: Recent Labs  Lab 05/05/23 1335  NA 125*  K 4.1  CL 89*  CO2 23  GLUCOSE 114*  BUN 21  CREATININE 0.46*  CALCIUM 9.0   Liver Function Tests: Recent Labs  Lab 05/05/23 1335  AST 273*  ALT 184*  ALKPHOS 690*   BILITOT 19.5*  PROT 6.9  ALBUMIN 2.8*   No results for input(s): "LIPASE", "AMYLASE" in the last 168 hours. Recent Labs  Lab 05/05/23 1335  AMMONIA 42*   CBC: Recent Labs  Lab 05/05/23 1335  WBC 14.9*  NEUTROABS 8.9*  HGB 11.6*  HCT 34.0*  MCV 88.1  PLT 19*   Cardiac Enzymes: No results for input(s): "CKTOTAL", "CKMB", "CKMBINDEX", "TROPONINI" in the last 168 hours.  BNP (last 3 results) Recent Labs    05/05/23 1335  BNP 31.6    ProBNP (last 3 results) No results for input(s): "PROBNP" in the last 8760 hours.  CBG: No results for input(s): "GLUCAP" in the last 168 hours.  Radiological Exams on Admission: US Abdomen Limited RUQ (LIVER/GB)  Result Date: 05/05/2023 CLINICAL DATA:  [id number] Transaminitis [id number] EXAM: ULTRASOUND ABDOMEN LIMITED COMPARISON:  12/04/2016 FINDINGS: Intrahepatic and extrahepatic biliary ductal dilatation is identified. CBD 1 cm. No hepatic parenchymal lesions  are seen. Gallbladder demonstrates echogenic sludge and hypervascularity which could indicate a mucosal lesion. The pancreatic mass observed on CT from earlier the same day was not evaluated sonographically. No gallbladder wall thickening or pericholecystic fluid is identified. Hepatopetal portal vein flow. IMPRESSION: 1. Hypervascular process in the gallbladder concerning for a potential neoplastic etiology. 2. Gallbladder appears mildly distended with evidence of echogenic sludge. 3. Intra- and extrahepatic biliary ductal dilatation. Electronically Signed   By: Layla Maw M.D.   On: 05/05/2023 16:24   CT CHEST ABDOMEN PELVIS W CONTRAST  Result Date: 05/05/2023 CLINICAL DATA:  Airspace opacity suspicious for pneumonia. Productive cough. Weakness. Disorientation. Pancreatic mass. * Tracking Code: BO * EXAM: CT CHEST, ABDOMEN, AND PELVIS WITH CONTRAST TECHNIQUE: Multidetector CT imaging of the chest, abdomen and pelvis was performed following the standard protocol during bolus  administration of intravenous contrast. RADIATION DOSE REDUCTION: This exam was performed according to the departmental dose-optimization program which includes automated exposure control, adjustment of the mA and/or kV according to patient size and/or use of iterative reconstruction technique. CONTRAST:  OMNIPAQUE IOHEXOL 300 MG/ML  SOLN COMPARISON:  Chest radiograph 05/05/2023 and abdominal ultrasound 12/04/2016 FINDINGS: CT CHEST FINDINGS Cardiovascular: Mild atheromatous vascular calcification of the aortic arch. Mild mitral valve calcification. Mediastinum/Nodes: Air-level in mildly dilated distal esophagus, possibly from dysmotility or reflux. Lungs/Pleura: Centrilobular and paraseptal emphysema. Hazy confluent central airspace opacity with faint nodular component most confluent in the right lower lobe but also present in the right upper lobe and right middle lobe, possibilities may include aspiration pneumonitis or multilobar pneumonia. Left lung is clear. No pleural effusion. Musculoskeletal: Unremarkable CT ABDOMEN PELVIS FINDINGS Hepatobiliary: Severe intrahepatic and extrahepatic biliary dilatation, common bile duct 1.6 cm in diameter, extending down to a pancreatic head mass. Mildly distended gallbladder without gallbladder wall thickening. No definite hepatic metastatic lesion identified. Pancreas: Dorsal pancreatic duct dilatation extending to the level of a pancreatic head mass. Mass characteristics: Size: 4.8 by 3.0 by 3.6 cm Location: Head and uncinate process Characterization: Solid Enhancement: Hypoenhancing Other Characteristics: The main pancreatic duct is distended. Local extent of mass: The mass substantially abuts the transverse duodenum. Vascular Involvement: The mass extends around the superior mesenteric artery, image 71 series 2. The mass abuts and substantially narrows the superior mesenteric vein on image 71 of series 2 with the upper margin of the mass abutting the confluence of  the SMV and splenic vein. The posterior margin of the mass abuts the inferior vena cava for example on image 72 of series 2. Variant hepatic artery anatomy: Conventional Bile Duct Involvement: Prominent dilatation of the biliary tree to the level of the mass. Variant biliary anatomy: Absent Adjacent Nodes: Porta hepatis node on image 68 series 2 measures 1.5 cm in short axis. Omental/Peritoneal Disease: Absent Distant Metastases: Nonspecific hypodense splenic lesion, cannot exclude metastatic lesion. Spleen: Centrally in the spleen a 1.6 by 1.5 cm lesion is observed, this is hypodense relative to the splenic parenchyma although not simple. While this could be a benign lesion such as hemangioma, metastatic pancreatic adenocarcinoma is not excluded. Adrenals/Urinary Tract: 1.4 by 1.8 cm left adrenal mass, relative washout of 48%, favoring adrenal adenoma. No further imaging workup of this lesion is indicated. The kidneys appear unremarkable. Urinary bladder unremarkable. Stomach/Bowel: Prominent stool throughout the colon favors constipation. No dilated bowel. Vascular/Lymphatic: Vascular encasement of the superior mesenteric artery by the tumor is noted above. The tumor abuts the SMV and IVC. 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 Reproductive: Prostatomegaly. Other: No supplemental non-categorized findings. Musculoskeletal: Unremarkable IMPRESSION: 1. 4.8 cm solid hypoenhancing pancreatic head mass with vascular encasement of the superior mesenteric artery and abutment of the SMV and IVC. The mass substantially abuts the transverse duodenum. 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 2. Severe intrahepatic and extrahepatic biliary dilatation extending down to the level of the pancreatic mass. The dorsal pancreatic duct is also dilated. 3. 1.6 cm hypodense splenic lesion, cannot exclude metastatic pancreatic adenocarcinoma. 4. 1.8 cm left adrenal mass, relative washout  of 48%, favoring adrenal adenoma. No further imaging workup of this lesion is indicated. 5. Hazy confluent central airspace opacity with faint nodular component most confluent in the right lower lobe but also present in the right upper lobe and right middle lobe, possibilities may include aspiration pneumonitis or multilobar pneumonia. 6. Air-level in mildly dilated distal esophagus, possibly from dysmotility or reflux. 7. Prominent stool throughout the colon favors constipation. 8. Prostatomegaly. 9. 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 Aortic Atherosclerosis (ICD10-I70.0) and Emphysema (ICD10-J43.9). Electronically Signed   By: Gaylyn Rong M.D.   On: 05/05/2023 15:52   DG Chest Portable 1 View  Result Date: 05/05/2023 CLINICAL DATA:  Dyspnea EXAM: PORTABLE CHEST 1 VIEW COMPARISON:  03/11/2019 FINDINGS: No pneumothorax or effusion. Normal cardiopericardial silhouette. Consolidative opacity in the medial right lung base. Overlapping cardiac leads. Degenerative changes of the spine and pelvis. IMPRESSION: Ill-defined consolidative opacity in the medial right lung base. Acute infiltrates possible. Recommend follow-up to confirm clearance. Electronically Signed   By: Karen Kays M.D.   On: 05/05/2023 13:52    Assessment/Plan Anthony Solis is a 67 y.o. male with medical history significant for polysubstance abuse, schizophrenia presented to the emergency department from the living facility where he has been with since 1998, with complaints of not feeling well.    Sepsis-likely due to community-acquired pneumonia, given tachycardia, leukocytosis, lactate 3.2, and infiltrates seen on CT of the chest.  He is hemodynamically stable. -Inpatient admission to progressive -Monitor on telemetry -Supplemental oxygen if needed, currently saturating well on room air -Empiric IV azithromycin and Rocephin -Follow-up blood culture (patient would allow only 1 culture to be  obtained) -Lactic acid still elevated, will give second liter fluid bolus now, and start maintenance fluids, trend lactate  Schizophrenia-with multiple hospital admissions in the past, and noted aggressive behavior -Will continue home medications once reconciled -Anticipate some difficulty in diagnosis and management, given his mental health  Lactic acidosis-due to pneumonia as above, treated as above  Hyponatremia-may explain his altered mental status though his baseline mental function is unclear.  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 -Regular diet with fluid restriction -Normal saline infusion -Trend sodium level every 6 hours  Pancreatic mass with hyperbilirubinemia, and abnormal LFTs -Raises concern for pancreatic adenocarcinoma -he may benefit from ERCP and stent placement as well as biopsy -ER provider has discussed with Dr. Dulce Sellar who will see in consultation  Thrombocytopenia-possibly related to active malignancy, however patient is not significantly anemic -Recheck CBC now -ER provider has ordered platelet transfusion in anticipation of needed procedure -ER provider has discussed with Dr. Myna Hidalgo of hematology who will consult   DVT prophylaxis: SCDs only due to thrombocytopenia    Code Status: Full Code  Consults called: GI and oncology as above  Admission status: The appropriate patient status for this patient is INPATIENT.  Inpatient status is judged to be reasonable and necessary in order to provide the required intensity of service to ensure the patient's safety. The patient's presenting symptoms, physical exam findings, and initial radiographic and laboratory data in the context of their chronic comorbidities is felt to place them at high risk for further clinical deterioration. Furthermore, it is not anticipated that the patient will be medically stable for  discharge from the hospital within 2 midnights of admission.    I certify that at the point of admission it is my clinical judgment that the patient will require inpatient hospital care spanning beyond 2 midnights from the point of admission due to high intensity of service, high risk for further deterioration and high frequency of surveillance required  Time spent: 65 minutes  Katalena Malveaux Sharlette Dense MD [facility] Pager [phone]  If 7PM-7AM, please contact night-coverage [email or web address] Password [facility]  05/05/2023, 5:05 PM

## 2023-05-05 NOTE — ED Triage Notes (Signed)
Does report productive cough, uncertain when it started. Reported others sick where he lives

## 2023-05-05 NOTE — ED Notes (Signed)
Pt adamantly refused to let me draw his blood for his blood work that was due at 1700. It was explained to him that we needed the blood work to appropriately treat him and he continued to refuse. It was explained to him that something could be missed or he could be treated for something he does not have, or under-treated. He understood and continued to refuse.   Blood bank called and informed me that he needed another pink top to appropriately type and screen him and I told her that he was adamantly refusing to let me draw anymore blood.

## 2023-05-05 NOTE — Consult Note (Signed)
I was asked to see Anthony Solis.  He is a very nice 67 year old African-American male.  He has a history of schizophrenia.  He is living in a group home when I am told.  He really cannot give me any history.  He appears to have lost weight.  He is not sure how much she is eating or when he last ate.  He has had no pain.  Again he has no recollection of family.  He does smoke.  He has had alcohol use.  He has had recreational drug use.  He was brought to the ER today.  He was incredibly jaundiced.  Metabolic panel showed a bilirubin of 19.5.  AST was 273 ALT 184.  Alkaline phosphatase 690.  Ammonia 42.  Albumin 2.8.  His sodium 125 potassium 89.  He clearly was very dehydrated.  His white cell count 14.9.  Hemoglobin 11.6.  Platelet count was very low at 19.  The last labs I see on him were from January of this year.  White cell count is 10.3.  Hemoglobin 13.6.  Platelet count 203,000.  He had a CT scan that was done.  This showed a 4.8 cm pancreatic head mass.  In case of superior mesenteric artery and blood in the superior mesenteric vein and IVC.  It abuts the duodenum.  There is severe intrahepatic intrahepatic biliary dilatation.  He has a 1.6 cm splenic lesion.  He has a 1.8 cm left adrenal mass.  There might be some opacity in the right lower lobe.  Again, he really cannot give me any history.   His vital signs are temperature 100.  His pulses 105.  Blood pressure 119/82.  His weight was not taken.  His head neck exam shows scleral icterus.  He has palatal icterus.  Oral mucosa is dry.  He has temporal muscle wasting.  He has no adenopathy in the neck.  Lungs are somewhat clear.  He may have some congestion bilaterally.  He has no wheezing.  Cardiac exam tachycardic but regular.  He has no murmurs.  Abdomen is slightly distended.  Is soft.  Bowel sounds are present.  He has no obvious abdominal mass.  There is no fluid wave.  There is no palpable liver or spleen tip.  Extremity shows muscle  atrophy in upper lower extremities.  Neurological exam shows a cognitive deficits.  Skin exam shows marked jaundice.    His peripheral blood smear is incredibly helpful.  He has nucleated red blood cells.  I actually see some erythroblasts.  He has some myeloblasts.  He clearly has myelophthistic changes.    I think this is to be a situation where we are not can be able to really treat [name].  I had to believe this is going to be adenocarcinoma of the pancreas.  We will send off a CA 19-9.  Again, the blood smear I think really tells the "story" of his prognosis.  I have to believe that he has bone marrow involvement by malignancy.  I suspect that once he gets hydrated up, he will be quite anemic.  He will be interesting to see what his reticulocyte count is.  Unfortunately, I suspect that we are not going to be able to treat this underlying malignancy.  I suspect that given his overall performance status (ECOG 3, at best) that we be looking at quality of life and palliative care issues.  Again I do not know if he has any family.  I  would certainly try to relieve the biliary obstruction.  I suspect he probably will need to have platelets given that for the procedure.  I just feel bad for [name].  It is hard to say how long he has had this situation.  Again, I think we are to have to focus on his quality of life.  I think getting Palliative Care involved with certainly be warranted.  Again tried to stent the obstruction would be reasonable.  We will follow along.    Christin Bach, MD  Romans 5:3-5

## 2023-05-05 NOTE — ED Notes (Signed)
Attempted again to obtain second set of blood cultures prior to starting antibiotics. Pt adamant against any additional blood draw. Unable to obtain second set and abx started.

## 2023-05-06 ENCOUNTER — Encounter (HOSPITAL_COMMUNITY): Payer: Self-pay | Admitting: Internal Medicine

## 2023-05-06 ENCOUNTER — Other Ambulatory Visit: Payer: Self-pay

## 2023-05-06 DIAGNOSIS — K839 Disease of biliary tract, unspecified: Secondary | ICD-10-CM | POA: Diagnosis not present

## 2023-05-06 DIAGNOSIS — A419 Sepsis, unspecified organism: Secondary | ICD-10-CM | POA: Diagnosis not present

## 2023-05-06 DIAGNOSIS — E279 Disorder of adrenal gland, unspecified: Secondary | ICD-10-CM | POA: Diagnosis not present

## 2023-05-06 DIAGNOSIS — J189 Pneumonia, unspecified organism: Secondary | ICD-10-CM

## 2023-05-06 LAB — RETICULOCYTES
Immature Retic Fract: 9.6 % (ref 2.3–15.9)
RBC.: 3.02 MIL/uL — ABNORMAL LOW (ref 4.22–5.81)
Retic Ct Pct: 0.4 % (ref 0.4–3.1)

## 2023-05-06 LAB — COMPREHENSIVE METABOLIC PANEL
ALT: 158 U/L — ABNORMAL HIGH (ref 0–44)
AST: 247 U/L — ABNORMAL HIGH (ref 15–41)
Albumin: 2.4 g/dL — ABNORMAL LOW (ref 3.5–5.0)
Alkaline Phosphatase: 557 U/L — ABNORMAL HIGH (ref 38–126)
Anion gap: 13 (ref 5–15)
BUN: 14 mg/dL (ref 8–23)
CO2: 18 mmol/L — ABNORMAL LOW (ref 22–32)
Calcium: 8.4 mg/dL — ABNORMAL LOW (ref 8.9–10.3)
Chloride: 100 mmol/L (ref 98–111)
Creatinine, Ser: <0.3 mg/dL — ABNORMAL LOW (ref 0.61–1.24)
Glucose, Bld: 104 mg/dL — ABNORMAL HIGH (ref 70–99)
Potassium: 3.8 mmol/L (ref 3.5–5.1)
Sodium: 131 mmol/L — ABNORMAL LOW (ref 135–145)
Total Bilirubin: 16.8 mg/dL — ABNORMAL HIGH (ref 0.3–1.2)
Total Protein: 5.4 g/dL — ABNORMAL LOW (ref 6.5–8.1)

## 2023-05-06 LAB — CBC WITH DIFFERENTIAL/PLATELET
Abs Immature Granulocytes: 1.58 10*3/uL — ABNORMAL HIGH (ref 0.00–0.07)
Abs Immature Granulocytes: 0.34 10*3/uL — ABNORMAL HIGH (ref 0.00–0.07)
Band Neutrophils: 2 %
Basophils Absolute: 0.1 10*3/uL (ref 0.0–0.1)
Basophils Absolute: 0 10*3/uL (ref 0.0–0.1)
Basophils Relative: 1 %
Basophils Relative: 0 %
Blasts: 0 %
Eosinophils Absolute: 0.1 10*3/uL (ref 0.0–0.5)
Eosinophils Absolute: 0.2 10*3/uL (ref 0.0–0.5)
Eosinophils Relative: 1 %
Eosinophils Relative: 2 %
HCT: 26.6 % — ABNORMAL LOW (ref 39.0–52.0)
HCT: 27.5 % — ABNORMAL LOW (ref 39.0–52.0)
Hemoglobin: 9.1 g/dL — ABNORMAL LOW (ref 13.0–17.0)
Hemoglobin: 9.2 g/dL — ABNORMAL LOW (ref 13.0–17.0)
Immature Granulocytes: 13 %
Lymphocytes Relative: 22 %
Lymphocytes Relative: 34 %
Lymphs Abs: 2.6 10*3/uL (ref 0.7–4.0)
Lymphs Abs: 3.9 10*3/uL (ref 0.7–4.0)
MCH: 30.2 pg (ref 26.0–34.0)
MCH: 30.7 pg (ref 26.0–34.0)
MCHC: 34.2 g/dL (ref 30.0–36.0)
MCHC: 33.5 g/dL (ref 30.0–36.0)
MCV: 88.4 fL (ref 80.0–100.0)
MCV: 91.7 fL (ref 80.0–100.0)
Metamyelocytes Relative: 3 %
Monocytes Absolute: 1 10*3/uL (ref 0.1–1.0)
Monocytes Absolute: 0.6 10*3/uL (ref 0.1–1.0)
Monocytes Relative: 8 %
Monocytes Relative: 5 %
Myelocytes: 0 %
Neutro Abs: 6.7 10*3/uL (ref 1.7–7.7)
Neutro Abs: 6.4 10*3/uL (ref 1.7–7.7)
Neutrophils Relative %: 55 %
Neutrophils Relative %: 54 %
Other: 0 %
Platelets: 15 10*3/uL — CL (ref 150–400)
Platelets: 40 10*3/uL — ABNORMAL LOW (ref 150–400)
Promyelocytes Relative: 0 %
RBC: 3.01 MIL/uL — ABNORMAL LOW (ref 4.22–5.81)
RBC: 3 MIL/uL — ABNORMAL LOW (ref 4.22–5.81)
RDW: 14.8 % (ref 11.5–15.5)
RDW: 15.1 % (ref 11.5–15.5)
WBC: 12.2 10*3/uL — ABNORMAL HIGH (ref 4.0–10.5)
WBC: 11.4 10*3/uL — ABNORMAL HIGH (ref 4.0–10.5)
nRBC: 0.8 % — ABNORMAL HIGH (ref 0.0–0.2)
nRBC: 0 /100{WBCs}
nRBC: 0.7 % — ABNORMAL HIGH (ref 0.0–0.2)

## 2023-05-06 LAB — URINALYSIS, ROUTINE W REFLEX MICROSCOPIC
Bacteria, UA: NONE SEEN
Glucose, UA: NEGATIVE mg/dL
Ketones, ur: NEGATIVE mg/dL
Leukocytes,Ua: NEGATIVE
Nitrite: NEGATIVE
Protein, ur: NEGATIVE mg/dL
Specific Gravity, Urine: 1.034 — ABNORMAL HIGH (ref 1.005–1.030)
pH: 5 (ref 5.0–8.0)

## 2023-05-06 LAB — LACTATE DEHYDROGENASE: LDH: 4022 U/L — ABNORMAL HIGH (ref 98–192)

## 2023-05-06 LAB — LACTIC ACID, PLASMA
Lactic Acid, Venous: 1.5 mmol/L (ref 0.5–1.9)
Lactic Acid, Venous: 1.7 mmol/L (ref 0.5–1.9)

## 2023-05-06 LAB — RAPID URINE DRUG SCREEN, HOSP PERFORMED
Amphetamines: NOT DETECTED
Barbiturates: NOT DETECTED
Benzodiazepines: NOT DETECTED
Cocaine: NOT DETECTED
Opiates: NOT DETECTED
Tetrahydrocannabinol: NOT DETECTED

## 2023-05-06 LAB — PREALBUMIN: Prealbumin: <5 mg/dL — ABNORMAL LOW (ref 18–38)

## 2023-05-06 MED ORDER — PNEUMOCOCCAL 20-VAL CONJ VACC 0.5 ML IM SUSY
0.5000 mL | PREFILLED_SYRINGE | INTRAMUSCULAR | Status: DC
  Filled 2023-05-06: qty 0.5

## 2023-05-06 MED ORDER — ACETAMINOPHEN 325 MG PO TABS
650.0000 mg | ORAL_TABLET | Freq: Once | ORAL | Status: AC
  Administered 2023-05-06: 650 mg via ORAL
  Filled 2023-05-06: qty 2

## 2023-05-06 MED ORDER — INFLUENZA VIRUS VACC SPLIT PF (FLUZONE) 0.5 ML IM SUSY
0.5000 mL | PREFILLED_SYRINGE | INTRAMUSCULAR | Status: DC
  Filled 2023-05-06: qty 0.5

## 2023-05-06 MED ORDER — BENZTROPINE MESYLATE 0.5 MG PO TABS
0.5000 mg | ORAL_TABLET | Freq: Two times a day (BID) | ORAL | Status: DC
  Administered 2023-05-06 – 2023-05-08 (×3): 0.5 mg via ORAL
  Filled 2023-05-06 (×4): qty 1

## 2023-05-06 MED ORDER — CLOZAPINE 100 MG PO TABS
200.0000 mg | ORAL_TABLET | Freq: Every day | ORAL | Status: DC
  Administered 2023-05-06: 200 mg via ORAL
  Filled 2023-05-06 (×2): qty 2

## 2023-05-06 MED ORDER — SODIUM CHLORIDE 0.9 % IV SOLN
INTRAVENOUS | Status: AC
  Administered 2023-05-06 – 2023-05-07 (×2): 50 mL/h via INTRAVENOUS

## 2023-05-06 MED ORDER — LEVOTHYROXINE SODIUM 50 MCG PO TABS
50.0000 ug | ORAL_TABLET | Freq: Every day | ORAL | Status: DC
  Administered 2023-05-07 – 2023-05-08 (×2): 50 ug via ORAL
  Filled 2023-05-06 (×2): qty 1

## 2023-05-06 MED ORDER — CLOZAPINE 100 MG PO TABS
300.0000 mg | ORAL_TABLET | Freq: Every day | ORAL | Status: DC
  Administered 2023-05-08: 300 mg via ORAL
  Filled 2023-05-06 (×3): qty 3

## 2023-05-06 MED ORDER — ADULT MULTIVITAMIN W/MINERALS CH
1.0000 | ORAL_TABLET | Freq: Every day | ORAL | Status: DC
  Administered 2023-05-06 – 2023-05-08 (×3): 1 via ORAL
  Filled 2023-05-06 (×3): qty 1

## 2023-05-06 NOTE — TOC Progression Note (Signed)
Transition of Care [facility]) - Progression Note    Patient Details  Name: Anthony Solis MRN: [id number] Date of Birth: 05/06/1956  Transition of Care [facility]) CM/SW Contact  Adrian Prows, RN Phone Number: 05/06/2023, 3:30 PM  Clinical Narrative:    Pt disoriented; attempted to contact POC pt's uncle [name] [phone]); message says number is disconnected; attempted to contact [facility] but line busy x 3; unable to complete TOC assessment; will pass on to oncoming TOC for follow up.        Expected Discharge Plan and Services                                               Social Determinants of Health (SDOH) Interventions SDOH Screenings   Food Insecurity: No Food Insecurity (05/06/2023)  Housing: Medium Risk (05/06/2023)  Transportation Needs: No Transportation Needs (05/06/2023)  Utilities: Not At Risk (05/06/2023)  Depression (PHQ2-9): Low Risk  (01/24/2021)  Tobacco Use: High Risk (05/06/2023)    Readmission Risk Interventions     No data to display

## 2023-05-06 NOTE — Progress Notes (Addendum)
PROGRESS NOTE  Anthony Solis ZOX:[id number] DOB: 03-Mar-1956 DOA: 05/05/2023 PCP: Default, Provider, MD  HPI/Recap of past 24 hours: Anthony Solis is a 67 y.o. male with medical history significant for polysubstance abuse, schizophrenia who presented to the ER from the living facility where he has been since 1998, with complaints of not feeling well.  Associated with jaundice.  He is oriented only to self, unable to provide further details.  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  Lab studies notable for leukocytosis, elevated LFTs, T. bili 19.5.  Medical oncology Dr. Myna Hidalgo and GI Dr. Dulce Sellar consulted.  05/06/23: Seen and examined at his bedside.  He has no new complaints.  He is alert however he is confused and unable to provide any meaningful history.  Denies having any abdominal pain or nausea.  Denies having any cardiopulmonary symptoms.  Assessment/Plan: Principal Problem:   Sepsis due to pneumonia (HCC)  Sepsis-likely due to community-acquired pneumonia, POA Given tachycardia, leukocytosis, lactate 3.2, and infiltrates seen on CT of the chest.   Continue Rocephin and IV azithromycin started empirically.   Continue to follow peripheral blood cultures x 2.   Trend lactic acid. Currently on NS at 75 cc/h>> 50 cc/h x 2 days (lactic acid has normalized). Maintain oxygen saturation above 92%.  Acute hypoxic respiratory failure secondary to above Currently on 4 L with O2 saturation of 99 to 100% Personally reviewed CT scan which showed multifocal infiltrates suggestive of pneumonia Continue to maintain O2 saturation above 92% Wean off O2 supplementation as tolerated.  Pancreatic mass with hyperbilirubinemia, and abnormal LFTs -Raises concern for pancreatic adenocarcinoma -Seen by GI and medical oncology, appreciate recommendations. Continue to trend LFTs. Palliative care  medicine consulted to assist with establishing goals of care.  Hyponatremia, unclear chronicity -May explain his altered mental status though his baseline mental function is unclear.  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 -Regular diet with fluid restriction Presenting serum sodium 125, repeat 131 this morning. Currently on NS at 75 cc/h.   Schizophrenia-with multiple hospital admissions in the past, and noted aggressive behavior Acute metabolic encephalopathy likely secondary to schizophrenia Resume home regimen. Reorient as needed Fall precautions   Lactic acidosis Due to pneumonia as above, treated as above   Hypothyroidism Resume home regimen.  Improving, thrombocytopenia- Possibly related to active malignancy, however patient is not significantly anemic On presentation platelet count 19K, 15K, improved to 40K this morning. No reported overt bleeding.     Time: 55 minutes.         DVT prophylaxis: SCDs only, due to thrombocytopenia     Code Status: Full Code   Consults called: GI and oncology as above.  Palliative care medicine.  Family communication: None at bedside.  Uncle who is emergency contact is unable to be reached.    Status is: Inpatient The patient requires at least 2 midnights for further evaluation and treatment of present condition.    Objective: Vitals:   05/06/23 0226 05/06/23 0239 05/06/23 0618 05/06/23 1228  BP: 108/78  120/83 122/79  Pulse: 100  97 (!) 104  Resp: (!) 22  20   Temp: 100.2 F (37.9 C)  97.9 F (36.6 C) 99.9 F (37.7 C)  TempSrc: Oral  Oral Oral  SpO2: 99%  100% 99%  Weight:  65.5 kg    Height:  5'  9" (1.753 m)      Intake/Output Summary (Last 24 hours) at 05/06/2023 1326 Last data filed at 05/06/2023 1016 Gross per 24 hour  Intake 1843.23 ml  Output 350 ml  Net 1493.23 ml   Filed Weights   05/06/23 0239   Weight: 65.5 kg    Exam:  General: 67 y.o. year-old male well developed well nourished in no acute distress.  Alert and confused. Cardiovascular: Regular rate and rhythm with no rubs or gallops.  No thyromegaly or JVD noted.   Respiratory: Clear to auscultation with no wheezes or rales. Good inspiratory effort. Abdomen: Mildly distended.  Nontender, with normal bowel sounds x4 quadrants. Musculoskeletal: Trace lower extremity edema. Skin: No ulcerative lesions noted or rashes, Psychiatry: Mood is appropriate for condition and setting   Data Reviewed: CBC: Recent Labs  Lab 05/05/23 1335 05/05/23 1653 05/06/23 0514  WBC 14.9* 12.2* 11.4*  NEUTROABS 8.9* 6.7 6.4  HGB 11.6* 9.1* 9.2*  HCT 34.0* 26.6* 27.5*  MCV 88.1 88.4 91.7  PLT 19* 15* 40*   Basic Metabolic Panel: Recent Labs  Lab 05/05/23 1335 05/06/23 0514  NA 125* 131*  K 4.1 3.8  CL 89* 100  CO2 23 18*  GLUCOSE 114* 104*  BUN 21 14  CREATININE 0.46* <0.30*  CALCIUM 9.0 8.4*   GFR: CrCl cannot be calculated (This lab value cannot be used to calculate CrCl because it is not a number: <0.30). Liver Function Tests: Recent Labs  Lab 05/05/23 1335 05/06/23 0514  AST 273* 247*  ALT 184* 158*  ALKPHOS 690* 557*  BILITOT 19.5* 16.8*  PROT 6.9 5.4*  ALBUMIN 2.8* 2.4*   No results for input(s): "LIPASE", "AMYLASE" in the last 168 hours. Recent Labs  Lab 05/05/23 1335  AMMONIA 42*   Coagulation Profile: No results for input(s): "INR", "PROTIME" in the last 168 hours. Cardiac Enzymes: No results for input(s): "CKTOTAL", "CKMB", "CKMBINDEX", "TROPONINI" in the last 168 hours. BNP (last 3 results) No results for input(s): "PROBNP" in the last 8760 hours. HbA1C: No results for input(s): "HGBA1C" in the last 72 hours. CBG: No results for input(s): "GLUCAP" in the last 168 hours. Lipid Profile: No results for input(s): "CHOL", "HDL", "LDLCALC", "TRIG", "CHOLHDL", "LDLDIRECT" in the last 72 hours. Thyroid  Function Tests: No results for input(s): "TSH", "T4TOTAL", "FREET4", "T3FREE", "THYROIDAB" in the last 72 hours. Anemia Panel: No results for input(s): "VITAMINB12", "FOLATE", "FERRITIN", "TIBC", "IRON", "RETICCTPCT" in the last 72 hours. Urine analysis:    Component Value Date/Time   COLORURINE AMBER (A) 05/05/2023 0015   APPEARANCEUR CLEAR 05/05/2023 0015   LABSPEC 1.034 (H) 05/05/2023 0015   PHURINE 5.0 05/05/2023 0015   GLUCOSEU NEGATIVE 05/05/2023 0015   HGBUR SMALL (A) 05/05/2023 0015   BILIRUBINUR MODERATE (A) 05/05/2023 0015   KETONESUR NEGATIVE 05/05/2023 0015   PROTEINUR NEGATIVE 05/05/2023 0015   UROBILINOGEN 1.0 08/21/2012 0456   NITRITE NEGATIVE 05/05/2023 0015   LEUKOCYTESUR NEGATIVE 05/05/2023 0015   Sepsis Labs: @LABRCNTIP (procalcitonin:4,lacticidven:4)  ) Recent Results (from the past 240 hour(s))  Blood culture (routine x 2)     Status: None (Preliminary result)   Collection Time: 05/05/23  1:35 PM   Specimen: BLOOD  Result Value Ref Range Status   Specimen Description   Final    BLOOD LEFT ANTECUBITAL Performed at [facility], [address] [place], [place] [address]    Special Requests   Final    BOTTLES DRAWN AEROBIC AND ANAEROBIC Blood Culture adequate volume Performed at  [facility], [address] [place], [place] [address]    Culture   Final    NO GROWTH < 24 HOURS Performed at [facility], [address], [place], [place] [address]    Report Status PENDING  Incomplete  Resp panel by RT-PCR (RSV, Flu A&B, Covid) Anterior Nasal Swab     Status: None   Collection Time: 05/05/23  2:46 PM   Specimen: Anterior Nasal Swab  Result Value Ref Range Status   SARS Coronavirus 2 by RT PCR NEGATIVE NEGATIVE Final    Comment: (NOTE) SARS-CoV-2 target nucleic acids are NOT DETECTED.  The SARS-CoV-2 RNA is generally detectable in upper respiratory specimens during the acute phase of infection. The lowest concentration  of SARS-CoV-2 viral copies this assay can detect is 138 copies/mL. A negative result does not preclude SARS-Cov-2 infection and should not be used as the sole basis for treatment or other patient management decisions. A negative result may occur with  improper specimen collection/handling, submission of specimen other than nasopharyngeal swab, presence of viral mutation(s) within the areas targeted by this assay, and inadequate number of viral copies(<138 copies/mL). A negative result must be combined with clinical observations, patient history, and epidemiological information. The expected result is Negative.  Fact Sheet for Patients:  BloggerCourse.com  Fact Sheet for Healthcare Providers:  SeriousBroker.it  This test is no t yet approved or cleared by the [place] FDA and  has been authorized for detection and/or diagnosis of SARS-CoV-2 by FDA under an Emergency Use Authorization (EUA). This EUA will remain  in effect (meaning this test can be used) for the duration of the COVID-19 declaration under Section 564(b)(1) of the Act, 21 U.S.C.section 360bbb-3(b)(1), unless the authorization is terminated  or revoked sooner.       Influenza A by PCR NEGATIVE NEGATIVE Final   Influenza B by PCR NEGATIVE NEGATIVE Final    Comment: (NOTE) The Xpert Xpress SARS-CoV-2/FLU/RSV plus assay is intended as an aid in the diagnosis of influenza from Nasopharyngeal swab specimens and should not be used as a sole basis for treatment. Nasal washings and aspirates are unacceptable for Xpert Xpress SARS-CoV-2/FLU/RSV testing.  Fact Sheet for Patients: BloggerCourse.com  Fact Sheet for Healthcare Providers: SeriousBroker.it  This test is not yet approved or cleared by the [place] FDA and has been authorized for detection and/or diagnosis of SARS-CoV-2 by FDA under an Emergency Use  Authorization (EUA). This EUA will remain in effect (meaning this test can be used) for the duration of the COVID-19 declaration under Section 564(b)(1) of the Act, 21 U.S.C. section 360bbb-3(b)(1), unless the authorization is terminated or revoked.     Resp Syncytial Virus by PCR NEGATIVE NEGATIVE Final    Comment: (NOTE) Fact Sheet for Patients: BloggerCourse.com  Fact Sheet for Healthcare Providers: SeriousBroker.it  This test is not yet approved or cleared by the [place] FDA and has been authorized for detection and/or diagnosis of SARS-CoV-2 by FDA under an Emergency Use Authorization (EUA). This EUA will remain in effect (meaning this test can be used) for the duration of the COVID-19 declaration under Section 564(b)(1) of the Act, 21 U.S.C. section 360bbb-3(b)(1), unless the authorization is terminated or revoked.  Performed at [facility], [address], [place], [place] [address]   Blood culture (routine x 2)     Status: None (Preliminary result)   Collection Time: 05/05/23  5:30 PM   Specimen: BLOOD  Result Value Ref Range Status  Specimen Description   Final    BLOOD LEFT ANTECUBITAL Performed at [facility], [address], [place], [place] [address]    Special Requests   Final    BOTTLES DRAWN AEROBIC AND ANAEROBIC Blood Culture results may not be optimal due to an excessive volume of blood received in culture bottles Performed at [facility], [address] [place], [place] [address]    Culture   Final    NO GROWTH < 24 HOURS Performed at [facility], [address], [place], [place] [address]    Report Status PENDING  Incomplete      Studies: US Abdomen Limited RUQ (LIVER/GB)  Result Date: 05/05/2023 CLINICAL DATA:  [id number] Transaminitis [id number] EXAM: ULTRASOUND ABDOMEN LIMITED COMPARISON:  12/04/2016 FINDINGS: Intrahepatic and extrahepatic  biliary ductal dilatation is identified. CBD 1 cm. No hepatic parenchymal lesions are seen. Gallbladder demonstrates echogenic sludge and hypervascularity which could indicate a mucosal lesion. The pancreatic mass observed on CT from earlier the same day was not evaluated sonographically. No gallbladder wall thickening or pericholecystic fluid is identified. Hepatopetal portal vein flow. IMPRESSION: 1. Hypervascular process in the gallbladder concerning for a potential neoplastic etiology. 2. Gallbladder appears mildly distended with evidence of echogenic sludge. 3. Intra- and extrahepatic biliary ductal dilatation. Electronically Signed   By: Layla Maw M.D.   On: 05/05/2023 16:24   CT CHEST ABDOMEN PELVIS W CONTRAST  Result Date: 05/05/2023 CLINICAL DATA:  Airspace opacity suspicious for pneumonia. Productive cough. Weakness. Disorientation. Pancreatic mass. * Tracking Code: BO * EXAM: CT CHEST, ABDOMEN, AND PELVIS WITH CONTRAST TECHNIQUE: Multidetector CT imaging of the chest, abdomen and pelvis was performed following the standard protocol during bolus administration of intravenous contrast. RADIATION DOSE REDUCTION: This exam was performed according to the departmental dose-optimization program which includes automated exposure control, adjustment of the mA and/or kV according to patient size and/or use of iterative reconstruction technique. CONTRAST:  OMNIPAQUE IOHEXOL 300 MG/ML  SOLN COMPARISON:  Chest radiograph 05/05/2023 and abdominal ultrasound 12/04/2016 FINDINGS: CT CHEST FINDINGS Cardiovascular: Mild atheromatous vascular calcification of the aortic arch. Mild mitral valve calcification. Mediastinum/Nodes: Air-level in mildly dilated distal esophagus, possibly from dysmotility or reflux. Lungs/Pleura: Centrilobular and paraseptal emphysema. Hazy confluent central airspace opacity with faint nodular component most confluent in the right lower lobe but also present in the right upper lobe  and right middle lobe, possibilities may include aspiration pneumonitis or multilobar pneumonia. Left lung is clear. No pleural effusion. Musculoskeletal: Unremarkable CT ABDOMEN PELVIS FINDINGS Hepatobiliary: Severe intrahepatic and extrahepatic biliary dilatation, common bile duct 1.6 cm in diameter, extending down to a pancreatic head mass. Mildly distended gallbladder without gallbladder wall thickening. No definite hepatic metastatic lesion identified. Pancreas: Dorsal pancreatic duct dilatation extending to the level of a pancreatic head mass. Mass characteristics: Size: 4.8 by 3.0 by 3.6 cm Location: Head and uncinate process Characterization: Solid Enhancement: Hypoenhancing Other Characteristics: The main pancreatic duct is distended. Local extent of mass: The mass substantially abuts the transverse duodenum. Vascular Involvement: The mass extends around the superior mesenteric artery, image 71 series 2. The mass abuts and substantially narrows the superior mesenteric vein on image 71 of series 2 with the upper margin of the mass abutting the confluence of the SMV and splenic vein. The posterior margin of the mass abuts the inferior vena cava for example on image 72 of series 2. Variant hepatic artery anatomy: Conventional Bile Duct Involvement: Prominent dilatation of the biliary tree to  the level of the mass. Variant biliary anatomy: Absent Adjacent Nodes: Porta hepatis node on image 68 series 2 measures 1.5 cm in short axis. Omental/Peritoneal Disease: Absent Distant Metastases: Nonspecific hypodense splenic lesion, cannot exclude metastatic lesion. Spleen: Centrally in the spleen a 1.6 by 1.5 cm lesion is observed, this is hypodense relative to the splenic parenchyma although not simple. While this could be a benign lesion such as hemangioma, metastatic pancreatic adenocarcinoma is not excluded. Adrenals/Urinary Tract: 1.4 by 1.8 cm left adrenal mass, relative washout of 48%, favoring adrenal adenoma.  No further imaging workup of this lesion is indicated. The kidneys appear unremarkable. Urinary bladder unremarkable. Stomach/Bowel: Prominent stool throughout the colon favors constipation. No dilated bowel. Vascular/Lymphatic: Vascular encasement of the superior mesenteric artery by the tumor is noted above. The tumor abuts the SMV and IVC. 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 Reproductive: Prostatomegaly. Other: No supplemental non-categorized findings. Musculoskeletal: Unremarkable IMPRESSION: 1. 4.8 cm solid hypoenhancing pancreatic head mass with vascular encasement of the superior mesenteric artery and abutment of the SMV and IVC. The mass substantially abuts the transverse duodenum. 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 2. Severe intrahepatic and extrahepatic biliary dilatation extending down to the level of the pancreatic mass. The dorsal pancreatic duct is also dilated. 3. 1.6 cm hypodense splenic lesion, cannot exclude metastatic pancreatic adenocarcinoma. 4. 1.8 cm left adrenal mass, relative washout of 48%, favoring adrenal adenoma. No further imaging workup of this lesion is indicated. 5. Hazy confluent central airspace opacity with faint nodular component most confluent in the right lower lobe but also present in the right upper lobe and right middle lobe, possibilities may include aspiration pneumonitis or multilobar pneumonia. 6. Air-level in mildly dilated distal esophagus, possibly from dysmotility or reflux. 7. Prominent stool throughout the colon favors constipation. 8. Prostatomegaly. 9. 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 Aortic Atherosclerosis (ICD10-I70.0) and Emphysema (ICD10-J43.9). Electronically Signed   By: Gaylyn Rong M.D.   On: 05/05/2023 15:52   DG Chest Portable 1 View  Result Date: 05/05/2023 CLINICAL DATA:  Dyspnea EXAM: PORTABLE CHEST 1 VIEW COMPARISON:  03/11/2019 FINDINGS: No  pneumothorax or effusion. Normal cardiopericardial silhouette. Consolidative opacity in the medial right lung base. Overlapping cardiac leads. Degenerative changes of the spine and pelvis. IMPRESSION: Ill-defined consolidative opacity in the medial right lung base. Acute infiltrates possible. Recommend follow-up to confirm clearance. Electronically Signed   By: Karen Kays M.D.   On: 05/05/2023 13:52    Scheduled Meds:  [START ON 05/07/2023] influenza vac split trivalent PF  0.5 mL Intramuscular Tomorrow-1000   [START ON 05/07/2023] pneumococcal 20-valent conjugate vaccine  0.5 mL Intramuscular Tomorrow-1000    Continuous Infusions:  sodium chloride 75 mL/hr at 05/06/23 0959   azithromycin Stopped (05/05/23 1824)   cefTRIAXone (ROCEPHIN)  IV       LOS: 1 day     Darlin Drop, MD [facility] Pager [phone]  If 7PM-7AM, please contact night-coverage [email or web address] Password [email or web address] 05/06/2023, 1:26 PM

## 2023-05-06 NOTE — Progress Notes (Signed)
PHARMACIST - PHYSICIAN ORDER COMMUNICATION  Rithvik Walworth is a 67 y.o. year old male with a history of schizophrenia on Clozapine PTA. Continuing this medication order as an inpatient requires that monitoring parameters per REMS requirements must be met.   Clozapine REMS Dispense Authorization was obtained, and will dispense inpatient.  RDA code [id number].  Verified Clozapine dose: 300 mg in the morning and 200 mg at bedtime.  Last ANC value and date reported on the Clozapine REMS website: 3000 on 02/26/2023 ANC monitoring frequency: monthly Next ANC reporting is due on (date) overdue for August 2024. Today's value=6400 and next ANC due in 4 weeks.  Lynden Ang, PharmD, BCPS 05/06/2023, 1:49 PM

## 2023-05-06 NOTE — Progress Notes (Signed)
Overall, the really has not been much change his situation.  He is more lethargic.  His labs showed total bilirubin of 16.8.  His albumin is 2.4.  His BUN is 14 creatinine 0.3.  LDH is over 4000.  White cell count 11.4.  Hemoglobin 9.2.  Platelet count 40,000.  He must have gotten platelets yesterday.  At this point, I really think there has to be some discussion with him about CODE STATUS.  I just do not think that he is going to be mentally competent to be able to make a decision.  I had to believe that his prognosis is easily less than 1 month.  He definitely needs Hospice.  I do not know of any family member that he has.  Again, I just do not think that he is able to competently to make a decision about end-of-life issues.  I think would be incredibly sad if he were to be placed on life support.  I think that if he were to be placed on life support, he would never come off.  Again, he has marrow involvement by his malignancy.  His bilirubin is quite high.  Has biliary obstruction.  I do not know if this can be resolved.  I do not know if he will have a ERCP.  Again, his performance status is quite poor-ECOG 3.  He is not a candidate for any systemic therapy.  We will continue to follow along.  From my point of view, everything that we do should be all about his quality of life.   Christin Bach, MD  Jeri Modena 17:14

## 2023-05-06 NOTE — Plan of Care (Signed)

## 2023-05-06 NOTE — ED Notes (Signed)
ED TO INPATIENT HANDOFF REPORT  ED Nurse Name and Phone #: Jacqulyn Liner EMTP [phone]  S Name/Age/Gender Anthony Solis 67 y.o. male Room/Bed: WA25/WA25  Code Status   Code Status: Full Code  Home/SNF/Other Skilled nursing facility Patient oriented to: self, place, and situation Is this baseline? No   Triage Complete: Triage complete  Chief Complaint Sepsis due to pneumonia (HCC) [J18.9, A41.9]  Triage Note Pt BIBA from [facility] center for generalized weakness starting possibly today after lunch. Pt unable to articulate additional, states he feels bad. Disoriented to time and some situation. Denies pain. Stroke screen negative. Pt has yellow sclera and tint to skin. Difficulty ambulating today  120/80 93% 2L HR 106 CBG 201  Does report productive cough, uncertain when it started. Reported others sick where he lives   Allergies No Known Allergies  Level of Care/Admitting Diagnosis ED Disposition     ED Disposition  Admit   Condition  --   Comment  Hospital Area: [facility] [[id number]]  Level of Care: Progressive [102]  Admit to Progressive based on following criteria: MULTISYSTEM THREATS such as stable sepsis, metabolic/electrolyte imbalance with or without encephalopathy that is responding to early treatment.  May admit patient to [facility] or [facility] if equivalent level of care is available:: Yes  Covid Evaluation: Asymptomatic - no recent exposure (last 10 days) testing not required  Diagnosis: Sepsis due to pneumonia [facility]) [[id number]]  Admitting Physician: Maryln Gottron [[id number]]  Attending Physician: Kirby Crigler, Parks Neptune [[id number]]  Certification:: I certify this patient will need inpatient services for at least 2 midnights  Expected Medical Readiness: 05/07/2023          B Medical/Surgery History Past Medical History:  Diagnosis Date   Drug abuse (HCC)    GERD (gastroesophageal reflux disease)    Hyperlipidemia     Hypertension    IBS (irritable bowel syndrome)    Polysubstance abuse (HCC)    Schizophrenia, schizo-affective type (HCC)    Thyroid disease    No past surgical history on file.   A IV Location/Drains/Wounds Patient Lines/Drains/Airways Status     Active Line/Drains/Airways     Name Placement date Placement time Site Days   Peripheral IV 05/05/23 20 G 1" Right Antecubital 05/05/23  1335  Antecubital  1   Peripheral IV 05/05/23 20 G 1" Left Antecubital 05/05/23  1730  Antecubital  1            Intake/Output Last 24 hours No intake or output data in the 24 hours ending 05/06/23 0123  Labs/Imaging Results for orders placed or performed during the hospital encounter of 05/05/23 (from the past 48 hour(s))  Rapid urine drug screen (hospital performed)     Status: None   Collection Time: 05/05/23 12:12 AM  Result Value Ref Range   Opiates NONE DETECTED NONE DETECTED   Cocaine NONE DETECTED NONE DETECTED   Benzodiazepines NONE DETECTED NONE DETECTED   Amphetamines NONE DETECTED NONE DETECTED   Tetrahydrocannabinol NONE DETECTED NONE DETECTED   Barbiturates NONE DETECTED NONE DETECTED    Comment: (NOTE) DRUG SCREEN FOR MEDICAL PURPOSES ONLY.  IF CONFIRMATION IS NEEDED FOR ANY PURPOSE, NOTIFY LAB WITHIN 5 DAYS.  LOWEST DETECTABLE LIMITS FOR URINE DRUG SCREEN Drug Class                     Cutoff (ng/mL) Amphetamine and metabolites    1000 Barbiturate and metabolites    200 Benzodiazepine  200 Opiates and metabolites        300 Cocaine and metabolites        300 THC                            50 Performed at [facility], [address] [place], [place] [address]   Urinalysis, Routine w reflex microscopic -Urine, Clean Catch     Status: Abnormal   Collection Time: 05/05/23 12:15 AM  Result Value Ref Range   Color, Urine AMBER (A) YELLOW    Comment: BIOCHEMICALS MAY BE AFFECTED BY COLOR   APPearance CLEAR CLEAR   Specific Gravity,  Urine 1.034 (H) 1.005 - 1.030   pH 5.0 5.0 - 8.0   Glucose, UA NEGATIVE NEGATIVE mg/dL   Hgb urine dipstick SMALL (A) NEGATIVE   Bilirubin Urine MODERATE (A) NEGATIVE   Ketones, ur NEGATIVE NEGATIVE mg/dL   Protein, ur NEGATIVE NEGATIVE mg/dL   Nitrite NEGATIVE NEGATIVE   Leukocytes,Ua NEGATIVE NEGATIVE   RBC / HPF 0-5 0 - 5 RBC/hpf   WBC, UA 0-5 0 - 5 WBC/hpf   Bacteria, UA NONE SEEN NONE SEEN   Squamous Epithelial / HPF 0-5 0 - 5 /HPF   Mucus PRESENT     Comment: Performed at [facility], [address] [place], [place] [address]  Comprehensive metabolic panel     Status: Abnormal   Collection Time: 05/05/23  1:35 PM  Result Value Ref Range   Sodium 125 (L) 135 - 145 mmol/L   Potassium 4.1 3.5 - 5.1 mmol/L   Chloride 89 (L) 98 - 111 mmol/L   CO2 23 22 - 32 mmol/L   Glucose, Bld 114 (H) 70 - 99 mg/dL    Comment: Glucose reference range applies only to samples taken after fasting for at least 8 hours.   BUN 21 8 - 23 mg/dL   Creatinine, Ser 0.98 (L) 0.61 - 1.24 mg/dL   Calcium 9.0 8.9 - 11.9 mg/dL   Total Protein 6.9 6.5 - 8.1 g/dL   Albumin 2.8 (L) 3.5 - 5.0 g/dL   AST 147 (H) 15 - 41 U/L   ALT 184 (H) 0 - 44 U/L   Alkaline Phosphatase 690 (H) 38 - 126 U/L   Total Bilirubin 19.5 (HH) 0.3 - 1.2 mg/dL    Comment: CRITICAL RESULT CALLED TO, READ BACK BY AND VERIFIED WITH BLAIR, I. RN AT 1429 ON 05/05/2023 BY MECIAL J.    GFR, Estimated >60 >60 mL/min    Comment: (NOTE) Calculated using the CKD-EPI Creatinine Equation (2021)    Anion gap 13 5 - 15    Comment: Performed at [facility], [address], [place], [place] [address]  Troponin I (High Sensitivity)     Status: None   Collection Time: 05/05/23  1:35 PM  Result Value Ref Range   Troponin I (High Sensitivity) 12 <18 ng/L    Comment: (NOTE) Elevated high sensitivity troponin I (hsTnI) values and significant  changes across serial measurements may suggest ACS but many other   chronic and acute conditions are known to elevate hsTnI results.  Refer to the "Links" section for chest pain algorithms and additional  guidance. Performed at [facility], [address], [place], [place] [address]   CBC with Differential     Status: Abnormal   Collection Time: 05/05/23  1:35 PM  Result Value Ref Range  WBC 14.9 (H) 4.0 - 10.5 K/uL   RBC 3.86 (L) 4.22 - 5.81 MIL/uL   Hemoglobin 11.6 (L) 13.0 - 17.0 g/dL   HCT 40.9 (L) 81.1 - 91.4 %   MCV 88.1 80.0 - 100.0 fL   MCH 30.1 26.0 - 34.0 pg   MCHC 34.1 30.0 - 36.0 g/dL   RDW 78.2 95.6 - 21.3 %   Platelets 19 (LL) 150 - 400 K/uL    Comment: SPECIMEN CHECKED FOR CLOTS Immature Platelet Fraction may be clinically indicated, consider ordering this additional test [id number] REPEATED TO VERIFY PLATELET COUNT CONFIRMED BY SMEAR    nRBC 0.5 (H) 0.0 - 0.2 %   Neutrophils Relative % 60 %   Neutro Abs 8.9 (H) 1.7 - 7.7 K/uL   Lymphocytes Relative 21 %   Lymphs Abs 3.1 0.7 - 4.0 K/uL   Monocytes Relative 6 %   Monocytes Absolute 1.0 0.1 - 1.0 K/uL   Eosinophils Relative 1 %   Eosinophils Absolute 0.1 0.0 - 0.5 K/uL   Basophils Relative 0 %   Basophils Absolute 0.1 0.0 - 0.1 K/uL   WBC Morphology Moderate Left Shift (>5% metas and myelos)    Immature Granulocytes 12 %   Abs Immature Granulocytes 1.85 (H) 0.00 - 0.07 K/uL    Comment: Performed at [facility], [address] [place], [place] [address]  Brain natriuretic peptide     Status: None   Collection Time: 05/05/23  1:35 PM  Result Value Ref Range   B Natriuretic Peptide 31.6 0.0 - 100.0 pg/mL    Comment: Performed at [facility], [address], [place], [place] [address]  Blood gas, venous (at [facility])     Status: Abnormal   Collection Time: 05/05/23  1:35 PM  Result Value Ref Range   pH, Ven 7.44 (H) 7.25 - 7.43   pCO2, Ven 39 (L) 44 - 60 mmHg   pO2, Ven <31 (LL) 32 - 45 mmHg    Comment:  CRITICAL RESULT CALLED TO, READ BACK BY AND VERIFIED WITH: BAIN, C 05/05/2023 1355 COATNEY, S     Bicarbonate 26.5 20.0 - 28.0 mmol/L   Acid-Base Excess 2.2 (H) 0.0 - 2.0 mmol/L   O2 Saturation 21.6 %   Patient temperature 37.0     Comment: Performed at [facility], [address] [place], [place] [address]  Blood culture (routine x 2)     Status: None (Preliminary result)   Collection Time: 05/05/23  1:35 PM   Specimen: BLOOD  Result Value Ref Range   Specimen Description      BLOOD LEFT ANTECUBITAL Performed at [facility], [address] [place], [place] [address]    Special Requests      BOTTLES DRAWN AEROBIC AND ANAEROBIC Blood Culture adequate volume Performed at [facility], [address], [place], [place] [address]    Culture PENDING    Report Status PENDING   Ammonia     Status: Abnormal   Collection Time: 05/05/23  1:35 PM  Result Value Ref Range   Ammonia 42 (H) 9 - 35 umol/L    Comment: Performed at [facility], [address], [place], [place] [address]  ABO/Rh     Status: None   Collection Time: 05/05/23  1:35 PM  Result Value Ref Range   ABO/RH(D)      A POS Performed at [facility], [address] [place], [place] [address]   I-Stat CG4 Lactic Acid     Status: Abnormal   Collection Time: 05/05/23  1:45 PM  Result Value Ref Range   Lactic Acid, Venous 3.2 (HH) 0.5 - 1.9 mmol/L  Resp panel by RT-PCR (RSV, Flu A&B, Covid) Anterior Nasal Swab     Status: None   Collection Time: 05/05/23  2:46 PM   Specimen: Anterior Nasal Swab  Result Value Ref Range   SARS Coronavirus 2 by RT PCR NEGATIVE NEGATIVE    Comment: (NOTE) SARS-CoV-2 target nucleic acids are NOT DETECTED.  The SARS-CoV-2 RNA is generally detectable in upper respiratory specimens during the acute phase of infection. The lowest concentration of SARS-CoV-2 viral copies this assay can detect is 138 copies/mL. A  negative result does not preclude SARS-Cov-2 infection and should not be used as the sole basis for treatment or other patient management decisions. A negative result may occur with  improper specimen collection/handling, submission of specimen other than nasopharyngeal swab, presence of viral mutation(s) within the areas targeted by this assay, and inadequate number of viral copies(<138 copies/mL). A negative result must be combined with clinical observations, patient history, and epidemiological information. The expected result is Negative.  Fact Sheet for Patients:  BloggerCourse.com  Fact Sheet for Healthcare Providers:  SeriousBroker.it  This test is no t yet approved or cleared by the [place] FDA and  has been authorized for detection and/or diagnosis of SARS-CoV-2 by FDA under an Emergency Use Authorization (EUA). This EUA will remain  in effect (meaning this test can be used) for the duration of the COVID-19 declaration under Section 564(b)(1) of the Act, 21 U.S.C.section 360bbb-3(b)(1), unless the authorization is terminated  or revoked sooner.       Influenza A by PCR NEGATIVE NEGATIVE   Influenza B by PCR NEGATIVE NEGATIVE    Comment: (NOTE) The Xpert Xpress SARS-CoV-2/FLU/RSV plus assay is intended as an aid in the diagnosis of influenza from Nasopharyngeal swab specimens and should not be used as a sole basis for treatment. Nasal washings and aspirates are unacceptable for Xpert Xpress SARS-CoV-2/FLU/RSV testing.  Fact Sheet for Patients: BloggerCourse.com  Fact Sheet for Healthcare Providers: SeriousBroker.it  This test is not yet approved or cleared by the [place] FDA and has been authorized for detection and/or diagnosis of SARS-CoV-2 by FDA under an Emergency Use Authorization (EUA). This EUA will remain in effect (meaning this test can be used)  for the duration of the COVID-19 declaration under Section 564(b)(1) of the Act, 21 U.S.C. section 360bbb-3(b)(1), unless the authorization is terminated or revoked.     Resp Syncytial Virus by PCR NEGATIVE NEGATIVE    Comment: (NOTE) Fact Sheet for Patients: BloggerCourse.com  Fact Sheet for Healthcare Providers: SeriousBroker.it  This test is not yet approved or cleared by the [place] FDA and has been authorized for detection and/or diagnosis of SARS-CoV-2 by FDA under an Emergency Use Authorization (EUA). This EUA will remain in effect (meaning this test can be used) for the duration of the COVID-19 declaration under Section 564(b)(1) of the Act, 21 U.S.C. section 360bbb-3(b)(1), unless the authorization is terminated or revoked.  Performed at [facility], [address], [place], [place] [address]   Troponin I (High Sensitivity)     Status: None   Collection Time: 05/05/23  4:35 PM  Result Value Ref Range   Troponin I (High Sensitivity) 11 <18 ng/L    Comment: (NOTE) Elevated high sensitivity troponin I (hsTnI)  values and significant  changes across serial measurements may suggest ACS but many other  chronic and acute conditions are known to elevate hsTnI results.  Refer to the "Links" section for chest pain algorithms and additional  guidance. Performed at [facility], [address], [place], [place] [address]   Prepare platelet pheresis     Status: None (Preliminary result)   Collection Time: 05/05/23  4:35 PM  Result Value Ref Range   Unit Number [id number]    Blood Component Type PLTP1 PSORALEN TREATED    Unit division 00    Status of Unit ISSUED    Transfusion Status      OK TO TRANSFUSE Performed at [facility], [address] [place], [place] [address]   I-Stat CG4 Lactic Acid     Status: Abnormal   Collection Time: 05/05/23  4:45 PM   Result Value Ref Range   Lactic Acid, Venous 3.1 (HH) 0.5 - 1.9 mmol/L  CBC with Differential     Status: Abnormal   Collection Time: 05/05/23  4:53 PM  Result Value Ref Range   WBC 12.2 (H) 4.0 - 10.5 K/uL   RBC 3.01 (L) 4.22 - 5.81 MIL/uL   Hemoglobin 9.1 (L) 13.0 - 17.0 g/dL   HCT 84.1 (L) 32.4 - 40.1 %   MCV 88.4 80.0 - 100.0 fL   MCH 30.2 26.0 - 34.0 pg   MCHC 34.2 30.0 - 36.0 g/dL   RDW 02.7 25.3 - 66.4 %   Platelets 15 (LL) 150 - 400 K/uL    Comment: SPECIMEN CHECKED FOR CLOTS Immature Platelet Fraction may be clinically indicated, consider ordering this additional test [id number] REPEATED TO VERIFY PLATELET COUNT CONFIRMED BY SMEAR    nRBC 0.8 (H) 0.0 - 0.2 %   Neutrophils Relative % 55 %   Neutro Abs 6.7 1.7 - 7.7 K/uL   Lymphocytes Relative 22 %   Lymphs Abs 2.6 0.7 - 4.0 K/uL   Monocytes Relative 8 %   Monocytes Absolute 1.0 0.1 - 1.0 K/uL   Eosinophils Relative 1 %   Eosinophils Absolute 0.1 0.0 - 0.5 K/uL   Basophils Relative 1 %   Basophils Absolute 0.1 0.0 - 0.1 K/uL   Immature Granulocytes 13 %   Abs Immature Granulocytes 1.58 (H) 0.00 - 0.07 K/uL    Comment: Performed at [facility], [address] [place], [place] [address]  HIV Antibody (routine testing w rflx)     Status: None   Collection Time: 05/05/23  4:53 PM  Result Value Ref Range   HIV Screen 4th Generation wRfx Non Reactive Non Reactive    Comment: Performed at [facility], [address], [place], [place] [address]  Type and screen [facility]     Status: None   Collection Time: 05/05/23  5:17 PM  Result Value Ref Range   ABO/RH(D) A POS    Antibody Screen NEG    Sample Expiration      05/08/2023,2359 Performed at [facility], [address] [place], [place] [address]   Blood culture (routine x 2)     Status: None (Preliminary result)   Collection Time: 05/05/23  5:30 PM   Specimen: BLOOD  Result Value Ref Range    Specimen Description      BLOOD LEFT ANTECUBITAL Performed at [facility], [address], [place], [place] [address]    Special Requests  BOTTLES DRAWN AEROBIC AND ANAEROBIC Blood Culture results may not be optimal due to an excessive volume of blood received in culture bottles Performed at [facility], [address], [place], [place] [address]    Culture PENDING    Report Status PENDING    US Abdomen Limited RUQ (LIVER/GB)  Result Date: 05/05/2023 CLINICAL DATA:  [id number] Transaminitis [id number] EXAM: ULTRASOUND ABDOMEN LIMITED COMPARISON:  12/04/2016 FINDINGS: Intrahepatic and extrahepatic biliary ductal dilatation is identified. CBD 1 cm. No hepatic parenchymal lesions are seen. Gallbladder demonstrates echogenic sludge and hypervascularity which could indicate a mucosal lesion. The pancreatic mass observed on CT from earlier the same day was not evaluated sonographically. No gallbladder wall thickening or pericholecystic fluid is identified. Hepatopetal portal vein flow. IMPRESSION: 1. Hypervascular process in the gallbladder concerning for a potential neoplastic etiology. 2. Gallbladder appears mildly distended with evidence of echogenic sludge. 3. Intra- and extrahepatic biliary ductal dilatation. Electronically Signed   By: Layla Maw M.D.   On: 05/05/2023 16:24   CT CHEST ABDOMEN PELVIS W CONTRAST  Result Date: 05/05/2023 CLINICAL DATA:  Airspace opacity suspicious for pneumonia. Productive cough. Weakness. Disorientation. Pancreatic mass. * Tracking Code: BO * EXAM: CT CHEST, ABDOMEN, AND PELVIS WITH CONTRAST TECHNIQUE: Multidetector CT imaging of the chest, abdomen and pelvis was performed following the standard protocol during bolus administration of intravenous contrast. RADIATION DOSE REDUCTION: This exam was performed according to the departmental dose-optimization program which includes automated exposure control, adjustment of the mA and/or kV  according to patient size and/or use of iterative reconstruction technique. CONTRAST:  OMNIPAQUE IOHEXOL 300 MG/ML  SOLN COMPARISON:  Chest radiograph 05/05/2023 and abdominal ultrasound 12/04/2016 FINDINGS: CT CHEST FINDINGS Cardiovascular: Mild atheromatous vascular calcification of the aortic arch. Mild mitral valve calcification. Mediastinum/Nodes: Air-level in mildly dilated distal esophagus, possibly from dysmotility or reflux. Lungs/Pleura: Centrilobular and paraseptal emphysema. Hazy confluent central airspace opacity with faint nodular component most confluent in the right lower lobe but also present in the right upper lobe and right middle lobe, possibilities may include aspiration pneumonitis or multilobar pneumonia. Left lung is clear. No pleural effusion. Musculoskeletal: Unremarkable CT ABDOMEN PELVIS FINDINGS Hepatobiliary: Severe intrahepatic and extrahepatic biliary dilatation, common bile duct 1.6 cm in diameter, extending down to a pancreatic head mass. Mildly distended gallbladder without gallbladder wall thickening. No definite hepatic metastatic lesion identified. Pancreas: Dorsal pancreatic duct dilatation extending to the level of a pancreatic head mass. Mass characteristics: Size: 4.8 by 3.0 by 3.6 cm Location: Head and uncinate process Characterization: Solid Enhancement: Hypoenhancing Other Characteristics: The main pancreatic duct is distended. Local extent of mass: The mass substantially abuts the transverse duodenum. Vascular Involvement: The mass extends around the superior mesenteric artery, image 71 series 2. The mass abuts and substantially narrows the superior mesenteric vein on image 71 of series 2 with the upper margin of the mass abutting the confluence of the SMV and splenic vein. The posterior margin of the mass abuts the inferior vena cava for example on image 72 of series 2. Variant hepatic artery anatomy: Conventional Bile Duct Involvement: Prominent dilatation of the  biliary tree to the level of the mass. Variant biliary anatomy: Absent Adjacent Nodes: Porta hepatis node on image 68 series 2 measures 1.5 cm in short axis. Omental/Peritoneal Disease: Absent Distant Metastases: Nonspecific hypodense splenic lesion, cannot exclude metastatic lesion. Spleen: Centrally in the spleen a 1.6 by 1.5 cm lesion is observed, this is hypodense relative to the splenic parenchyma although not simple. While  this could be a benign lesion such as hemangioma, metastatic pancreatic adenocarcinoma is not excluded. Adrenals/Urinary Tract: 1.4 by 1.8 cm left adrenal mass, relative washout of 48%, favoring adrenal adenoma. No further imaging workup of this lesion is indicated. The kidneys appear unremarkable. Urinary bladder unremarkable. Stomach/Bowel: Prominent stool throughout the colon favors constipation. No dilated bowel. Vascular/Lymphatic: Vascular encasement of the superior mesenteric artery by the tumor is noted above. The tumor abuts the SMV and IVC. 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 Reproductive: Prostatomegaly. Other: No supplemental non-categorized findings. Musculoskeletal: Unremarkable IMPRESSION: 1. 4.8 cm solid hypoenhancing pancreatic head mass with vascular encasement of the superior mesenteric artery and abutment of the SMV and IVC. The mass substantially abuts the transverse duodenum. 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 2. Severe intrahepatic and extrahepatic biliary dilatation extending down to the level of the pancreatic mass. The dorsal pancreatic duct is also dilated. 3. 1.6 cm hypodense splenic lesion, cannot exclude metastatic pancreatic adenocarcinoma. 4. 1.8 cm left adrenal mass, relative washout of 48%, favoring adrenal adenoma. No further imaging workup of this lesion is indicated. 5. Hazy confluent central airspace opacity with faint nodular component most confluent in the right lower lobe but also present in  the right upper lobe and right middle lobe, possibilities may include aspiration pneumonitis or multilobar pneumonia. 6. Air-level in mildly dilated distal esophagus, possibly from dysmotility or reflux. 7. Prominent stool throughout the colon favors constipation. 8. Prostatomegaly. 9. 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 Aortic Atherosclerosis (ICD10-I70.0) and Emphysema (ICD10-J43.9). Electronically Signed   By: Gaylyn Rong M.D.   On: 05/05/2023 15:52   DG Chest Portable 1 View  Result Date: 05/05/2023 CLINICAL DATA:  Dyspnea EXAM: PORTABLE CHEST 1 VIEW COMPARISON:  03/11/2019 FINDINGS: No pneumothorax or effusion. Normal cardiopericardial silhouette. Consolidative opacity in the medial right lung base. Overlapping cardiac leads. Degenerative changes of the spine and pelvis. IMPRESSION: Ill-defined consolidative opacity in the medial right lung base. Acute infiltrates possible. Recommend follow-up to confirm clearance. Electronically Signed   By: Karen Kays M.D.   On: 05/05/2023 13:52    Pending Labs Unresulted Labs (From admission, onward)     Start     Ordered   05/06/23 1805  Reticulocytes  Once,   R        05/05/23 1806   05/06/23 0500  Cancer antigen 19-9  Tomorrow morning,   R        05/05/23 1806   05/06/23 0500  Prealbumin  Tomorrow morning,   R        05/05/23 1806   05/06/23 0500  CBC with Differential/Platelet  Daily,   R      05/05/23 1806   05/06/23 0500  Comprehensive metabolic panel  Daily,   R      05/05/23 1806   05/06/23 0500  Lactate dehydrogenase  Tomorrow morning,   R        05/05/23 1806   05/05/23 1335  Pathologist smear review  Once,   R        05/05/23 1335            Vitals/Pain Today's Vitals   05/06/23 0005 05/06/23 0015 05/06/23 0100 05/06/23 0123  BP: 120/86 129/88 131/82   Pulse: (!) 101 (!) 101 (!) 101   Resp: (!) 28 (!) 27 (!) 25   Temp: (!) 101 F (38.3 C) (!) 100.6 F (38.1 C)    TempSrc:  Oral Oral     SpO2:   95%   PainSc:    0-No pain    Isolation Precautions No active isolations  Medications Medications  0.9 %  sodium chloride infusion ( Intravenous New Bag/Given 05/05/23 2015)  azithromycin (ZITHROMAX) 500 mg in sodium chloride 0.9 % 250 mL IVPB (0 mg Intravenous Stopped 05/05/23 1909)  cefTRIAXone (ROCEPHIN) 1 g in sodium chloride 0.9 % 100 mL IVPB (has no administration in time range)  traMADol (ULTRAM) tablet 50 mg (has no administration in time range)  traZODone (DESYREL) tablet 25 mg (has no administration in time range)  ondansetron (ZOFRAN) tablet 4 mg (has no administration in time range)    Or  ondansetron (ZOFRAN) injection 4 mg (has no administration in time range)  albuterol (PROVENTIL) (2.5 MG/3ML) 0.083% nebulizer solution 2.5 mg (has no administration in time range)  ipratropium-albuterol (DUONEB) 0.5-2.5 (3) MG/3ML nebulizer solution 3 mL (3 mLs Nebulization Given 05/05/23 1354)  lactated ringers bolus 1,000 mL (0 mLs Intravenous Stopped 05/05/23 1909)  cefTRIAXone (ROCEPHIN) 2 g in sodium chloride 0.9 % 100 mL IVPB (0 g Intravenous Stopped 05/05/23 1627)  lactulose (CHRONULAC) 10 GM/15ML solution 30 g (30 g Oral Given 05/05/23 1635)  iohexol (OMNIPAQUE) 300 MG/ML solution 100 mL (100 mLs Intravenous Contrast Given 05/05/23 1518)  0.9 %  sodium chloride infusion (Manually program via Guardrails IV Fluids) (0 mLs Intravenous Paused 05/06/23 0026)  sodium chloride 0.9 % bolus 1,000 mL (0 mLs Intravenous Stopped 05/05/23 2147)  acetaminophen (TYLENOL) tablet 650 mg (650 mg Oral Given 05/06/23 0017)    Mobility Has not ambulated while here. Unaware of his mobility at the facility.      Focused Assessments Patient doesn't speak much and when he does its not clear everytime. He will go from understanding to jibbering. Patient has not moved out of bed but able to follow commands when we changed him earlier. Patient has periods of incontinence. Did witness him use the urinal  earlier. Patients skin is very jaundice. Patient refused lab work earlier and when I took over was able to get patient to let me draw the labs. Unsure how he will do for morning draws.    R Recommendations: See Admitting Provider Note  Report given to: Jacqulyn Cane   RN  [phone]   Additional Notes:

## 2023-05-06 NOTE — Consult Note (Signed)
[facility] Consultation Note  Referring Provider: [facility] Primary Care Physician:  Default, Provider, MD Primary Gastroenterologist:  Gentry Fitz  Reason for Consultation:  jaundice, pancreatic mass  HPI: Anthony Solis is a 67 y.o. male presenting jaundice, pancreatic mass.  Apparently longstanding resident of living facility, presenting to ED for not feeling well.  At this time, he is not alert or conversant.  He has imaging suggesting obstructive jaundice from pancreatic mass.  LFTs elevated.    Past Medical History:  Diagnosis Date   Drug abuse (HCC)    GERD (gastroesophageal reflux disease)    Hyperlipidemia    Hypertension    IBS (irritable bowel syndrome)    Polysubstance abuse (HCC)    Schizophrenia, schizo-affective type (HCC)    Thyroid disease     History reviewed. No pertinent surgical history.  Prior to Admission medications   Medication Sig Start Date End Date Taking? Authorizing Provider  amLODipine (NORVASC) 2.5 MG tablet Take 2.5 mg by mouth daily. 01/05/21  Yes [provider]  benztropine (COGENTIN) 0.5 MG tablet Take 1 tablet (0.5 mg total) by mouth 2 (two) times daily. For EPS 02/18/20  Yes Nwoko, Nicole Kindred I, NP  cloZAPine (CLOZARIL) 100 MG tablet Take 2 tablets (200 mg total) by mouth 2 (two) times daily. For mood control Patient taking differently: Take 200-300 mg by mouth 2 (two) times daily. Take 3 tablets (300 mg) in the morning and 2 tablets (200 mg) at bedtime. 02/18/20  Yes Armandina Stammer I, NP  dicyclomine (BENTYL) 20 MG tablet Take 20 mg by mouth 3 (three) times daily.    Yes [provider]  docusate sodium (COLACE) 100 MG capsule Take 100 mg by mouth daily.   Yes [provider]  fenofibrate (TRICOR) 48 MG tablet Take 48 mg by mouth daily.   Yes [provider]  haloperidol (HALDOL) 2 MG/ML solution Take 4 mg by mouth as needed for agitation.   Yes [provider]  levothyroxine  (SYNTHROID, LEVOTHROID) 50 MCG tablet Take 50 mcg by mouth daily.   Yes [provider]  mirtazapine (REMERON) 30 MG tablet Take 1 tablet (30 mg total) by mouth at bedtime. For depression 02/18/20  Yes Armandina Stammer I, NP  Multiple Vitamin (MULTIVITAMIN) tablet Take 1 tablet by mouth daily.   Yes [provider]  NONFORMULARY OR COMPOUNDED ITEM Apply 1 mL topically as needed (Agitation). Compounded Ativan/Benadryl/Haldol 2mg -25mg -2mg /mL   Yes [provider]  QUEtiapine (SEROQUEL) 200 MG tablet Take 1 tablet (200 mg total) by mouth at bedtime. For mood control Patient taking differently: Take 200 mg by mouth in the morning. For mood control 02/18/20  Yes Nwoko, Nicole Kindred I, NP  QUEtiapine (SEROQUEL) 300 MG tablet Take 300 mg by mouth at bedtime.   Yes [provider]  senna (SENOKOT) 8.6 MG tablet Take 1 tablet by mouth at bedtime.   Yes [provider]  simvastatin (ZOCOR) 10 MG tablet Take 10 mg by mouth at bedtime.   Yes [provider]    Current Facility-Administered Medications  Medication Dose Route Frequency Provider Last Rate Last Admin   0.9 %  sodium chloride infusion   Intravenous Continuous Kirby Crigler, Mir M, MD 75 mL/hr at 05/06/23 0959 New Bag at 05/06/23 0959   albuterol (PROVENTIL) (2.5 MG/3ML) 0.083% nebulizer solution 2.5 mg  2.5 mg Nebulization Q2H PRN Kirby Crigler, Mir M, MD       azithromycin (ZITHROMAX) 500 mg in sodium chloride 0.9 % 250 mL IVPB  500 mg Intravenous Q24H Darlin Drop, DO   Stopped at 05/05/23 1824   cefTRIAXone (ROCEPHIN) 1 g in sodium chloride 0.9 % 100 mL IVPB  1 g Intravenous Q24H Kirby Crigler, Mir M, MD       [START ON 05/07/2023] influenza vac split trivalent PF (FLULAVAL) injection 0.5 mL  0.5 mL Intramuscular Tomorrow-1000 Kirby Crigler, Mir M, MD       ondansetron [facility]) tablet 4 mg  4 mg Oral Q6H PRN Kirby Crigler, Mir M, MD       Or   ondansetron [facility]) injection 4 mg  4 mg Intravenous Q6H PRN Maryln Gottron, MD       [START ON 05/07/2023] pneumococcal 20-valent conjugate vaccine (PREVNAR 20) injection 0.5 mL  0.5 mL Intramuscular Tomorrow-1000 Kirby Crigler, Mir M, MD       traMADol Janean Sark) tablet 50 mg  50 mg Oral Q8H PRN Kirby Crigler, Mir M, MD       traZODone (DESYREL) tablet 25 mg  25 mg Oral QHS PRN Maryln Gottron, MD        Allergies as of 05/05/2023   (No Known Allergies)    Family History  Family history unknown: Yes    Social History   Socioeconomic History   Marital status: Married    Spouse name: Not on file   Number of children: Not on file   Years of education: Not on file   Highest education level: Not on file  Occupational History   Occupation: unemployed/disabled  Tobacco Use   Smoking status: Every Day    Current packs/day: 0.50    Average packs/day: 0.5 packs/day for 45.0 years (22.5 ttl pk-yrs)    Types: Cigarettes, Cigars   Smokeless tobacco: Never  Vaping Use   Vaping status: Never Used  Substance and Sexual Activity   Alcohol use: Yes    Alcohol/week: 7.0 standard drinks of alcohol    Types: 7 Glasses of wine per week    Comment: drinks wine daily per pt   Drug use: Not Currently    Types: Marijuana, Codeine    Comment: last inhaled cocaine was ~20 years ago   Sexual activity: Yes    Partners: Female    Comment: declined condoms 06/2019  Other Topics Concern   Not on file  Social History Narrative   Patient reports multiple incarcerations for "murder"; he now lives in an ALF vs. SNF at [facility]   Social Determinants of Health   Financial Resource Strain: Not on file  Food Insecurity: No Food Insecurity (05/06/2023)   Hunger Vital Sign    Worried About Running Out of Food in the Last Year: Never true    Ran Out of Food in the Last Year: Never true  Transportation Needs: No Transportation Needs (05/06/2023)   PRAPARE - Administrator, Civil Service (Medical): No    Lack of Transportation (Non-Medical): No   Physical Activity: Not on file  Stress: Not on file  Social Connections: Not on file  Intimate Partner Violence: Not At Risk (05/06/2023)   Humiliation, Afraid, Rape, and Kick questionnaire    Fear of Current or Ex-Partner: No    Emotionally Abused: No    Physically Abused: No    Sexually Abused: No    Review of Systems: Unable to obtain due to altered mental status  Physical Exam: Vital signs in last 24 hours: Temp:  [97.9 F (36.6 C)-101 F (38.3 C)] 97.9 F (36.6 C) (09/15 0618) Pulse  Rate:  [97-107] 97 (09/15 0618) Resp:  [16-33] 20 (09/15 0618) BP: (108-143)/(77-91) 120/83 (09/15 0618) SpO2:  [91 %-100 %] 100 % (09/15 0618) Weight:  [65.5 kg] 65.5 kg (09/15 0239) Last BM Date : 05/06/23 General:   Somnolent, not arousable, irregular respiratory pattern Head:  Normocephalic and atraumatic. Eyes:  Sclera icteric  Conjunctiva pale Ears:  Normal auditory acuity. Nose:  No deformity, discharge,  or lesions. Mouth:  No deformity or lesions.  Oropharynx pale, dry Neck:  Supple; no masses or thyromegaly. Lungs:  Irregular Cheyne-Stokes type respiration Abdomen:  Protuberant, distended Msk:  Symmetrical without gross deformities. Normal posture. Pulses:  Normal pulses noted. Extremities:  Without clubbing or edema. Neurologic:  Somnolent, non-conversant Skin:  Intact without significant lesions or rashes. Psych:  Somnolent, non-conversant   Lab Results: Recent Labs    05/05/23 1335 05/05/23 1653 05/06/23 0514  WBC 14.9* 12.2* 11.4*  HGB 11.6* 9.1* 9.2*  HCT 34.0* 26.6* 27.5*  PLT 19* 15* 40*   BMET Recent Labs    05/05/23 1335 05/06/23 0514  NA 125* 131*  K 4.1 3.8  CL 89* 100  CO2 23 18*  GLUCOSE 114* 104*  BUN 21 14  CREATININE 0.46* <0.30*  CALCIUM 9.0 8.4*   LFT Recent Labs    05/06/23 0514  PROT 5.4*  ALBUMIN 2.4*  AST 247*  ALT 158*  ALKPHOS 557*  BILITOT 16.8*   PT/INR No results for input(s): "LABPROT", "INR" in the last 72  hours.  Studies/Results: US Abdomen Limited RUQ (LIVER/GB)  Result Date: 05/05/2023 CLINICAL DATA:  [id number] Transaminitis [id number] EXAM: ULTRASOUND ABDOMEN LIMITED COMPARISON:  12/04/2016 FINDINGS: Intrahepatic and extrahepatic biliary ductal dilatation is identified. CBD 1 cm. No hepatic parenchymal lesions are seen. Gallbladder demonstrates echogenic sludge and hypervascularity which could indicate a mucosal lesion. The pancreatic mass observed on CT from earlier the same day was not evaluated sonographically. No gallbladder wall thickening or pericholecystic fluid is identified. Hepatopetal portal vein flow. IMPRESSION: 1. Hypervascular process in the gallbladder concerning for a potential neoplastic etiology. 2. Gallbladder appears mildly distended with evidence of echogenic sludge. 3. Intra- and extrahepatic biliary ductal dilatation. Electronically Signed   By: Layla Maw M.D.   On: 05/05/2023 16:24   CT CHEST ABDOMEN PELVIS W CONTRAST  Result Date: 05/05/2023 CLINICAL DATA:  Airspace opacity suspicious for pneumonia. Productive cough. Weakness. Disorientation. Pancreatic mass. * Tracking Code: BO * EXAM: CT CHEST, ABDOMEN, AND PELVIS WITH CONTRAST TECHNIQUE: Multidetector CT imaging of the chest, abdomen and pelvis was performed following the standard protocol during bolus administration of intravenous contrast. RADIATION DOSE REDUCTION: This exam was performed according to the departmental dose-optimization program which includes automated exposure control, adjustment of the mA and/or kV according to patient size and/or use of iterative reconstruction technique. CONTRAST:  OMNIPAQUE IOHEXOL 300 MG/ML  SOLN COMPARISON:  Chest radiograph 05/05/2023 and abdominal ultrasound 12/04/2016 FINDINGS: CT CHEST FINDINGS Cardiovascular: Mild atheromatous vascular calcification of the aortic arch. Mild mitral valve calcification. Mediastinum/Nodes: Air-level in mildly dilated distal esophagus, possibly  from dysmotility or reflux. Lungs/Pleura: Centrilobular and paraseptal emphysema. Hazy confluent central airspace opacity with faint nodular component most confluent in the right lower lobe but also present in the right upper lobe and right middle lobe, possibilities may include aspiration pneumonitis or multilobar pneumonia. Left lung is clear. No pleural effusion. Musculoskeletal: Unremarkable CT ABDOMEN PELVIS FINDINGS Hepatobiliary: Severe intrahepatic and extrahepatic biliary dilatation, common bile duct 1.6 cm in diameter, extending down to a pancreatic head  mass. Mildly distended gallbladder without gallbladder wall thickening. No definite hepatic metastatic lesion identified. Pancreas: Dorsal pancreatic duct dilatation extending to the level of a pancreatic head mass. Mass characteristics: Size: 4.8 by 3.0 by 3.6 cm Location: Head and uncinate process Characterization: Solid Enhancement: Hypoenhancing Other Characteristics: The main pancreatic duct is distended. Local extent of mass: The mass substantially abuts the transverse duodenum. Vascular Involvement: The mass extends around the superior mesenteric artery, image 71 series 2. The mass abuts and substantially narrows the superior mesenteric vein on image 71 of series 2 with the upper margin of the mass abutting the confluence of the SMV and splenic vein. The posterior margin of the mass abuts the inferior vena cava for example on image 72 of series 2. Variant hepatic artery anatomy: Conventional Bile Duct Involvement: Prominent dilatation of the biliary tree to the level of the mass. Variant biliary anatomy: Absent Adjacent Nodes: Porta hepatis node on image 68 series 2 measures 1.5 cm in short axis. Omental/Peritoneal Disease: Absent Distant Metastases: Nonspecific hypodense splenic lesion, cannot exclude metastatic lesion. Spleen: Centrally in the spleen a 1.6 by 1.5 cm lesion is observed, this is hypodense relative to the splenic parenchyma although  not simple. While this could be a benign lesion such as hemangioma, metastatic pancreatic adenocarcinoma is not excluded. Adrenals/Urinary Tract: 1.4 by 1.8 cm left adrenal mass, relative washout of 48%, favoring adrenal adenoma. No further imaging workup of this lesion is indicated. The kidneys appear unremarkable. Urinary bladder unremarkable. Stomach/Bowel: Prominent stool throughout the colon favors constipation. No dilated bowel. Vascular/Lymphatic: Vascular encasement of the superior mesenteric artery by the tumor is noted above. The tumor abuts the SMV and IVC. 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 Reproductive: Prostatomegaly. Other: No supplemental non-categorized findings. Musculoskeletal: Unremarkable IMPRESSION: 1. 4.8 cm solid hypoenhancing pancreatic head mass with vascular encasement of the superior mesenteric artery and abutment of the SMV and IVC. The mass substantially abuts the transverse duodenum. 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 2. Severe intrahepatic and extrahepatic biliary dilatation extending down to the level of the pancreatic mass. The dorsal pancreatic duct is also dilated. 3. 1.6 cm hypodense splenic lesion, cannot exclude metastatic pancreatic adenocarcinoma. 4. 1.8 cm left adrenal mass, relative washout of 48%, favoring adrenal adenoma. No further imaging workup of this lesion is indicated. 5. Hazy confluent central airspace opacity with faint nodular component most confluent in the right lower lobe but also present in the right upper lobe and right middle lobe, possibilities may include aspiration pneumonitis or multilobar pneumonia. 6. Air-level in mildly dilated distal esophagus, possibly from dysmotility or reflux. 7. Prominent stool throughout the colon favors constipation. 8. Prostatomegaly. 9. 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 Aortic Atherosclerosis (ICD10-I70.0) and Emphysema  (ICD10-J43.9). Electronically Signed   By: Gaylyn Rong M.D.   On: 05/05/2023 15:52   DG Chest Portable 1 View  Result Date: 05/05/2023 CLINICAL DATA:  Dyspnea EXAM: PORTABLE CHEST 1 VIEW COMPARISON:  03/11/2019 FINDINGS: No pneumothorax or effusion. Normal cardiopericardial silhouette. Consolidative opacity in the medial right lung base. Overlapping cardiac leads. Degenerative changes of the spine and pelvis. IMPRESSION: Ill-defined consolidative opacity in the medial right lung base. Acute infiltrates possible. Recommend follow-up to confirm clearance. Electronically Signed   By: Karen Kays M.D.   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 Elevated LFTs. Thrombocytopenia. Poor functional status. Altered mental status.  Plan:  Patient appears moribund.  I can not obtain any history from patient. Would recommend antibiotics and supportive care for now. Patient is in no shape whatsoever for ERCP with biliary decompression both from his overall functional status as well as his significant thrombocytopenia. Should patient improve with antibiotics and supportive care, could reconsider possible ERCP next week. Hospice consultation. Dr. Elnoria Howard to revisit patient tomorrow.   LOS: 1 day   Thao Vanover M  05/06/2023, 11:52 AM  Cell [phone] If no answer or after 5 PM call [phone]

## 2023-05-06 NOTE — Plan of Care (Signed)
  Problem: Nutrition: Goal: Adequate nutrition will be maintained Outcome: Progressing   

## 2023-05-06 NOTE — Progress Notes (Signed)
This RN attempted to contact family member. Number listed in Epic has been disconnected, PT is unable to state any family member names but states "I have family all over the [place]". I called [facility] center where he resides (listed under home phone in [place]). Was able to speak to Med Tech who told me he has no family member contact information in his chart and was unable to help.

## 2023-05-07 DIAGNOSIS — Z515 Encounter for palliative care: Secondary | ICD-10-CM

## 2023-05-07 DIAGNOSIS — J189 Pneumonia, unspecified organism: Secondary | ICD-10-CM | POA: Diagnosis not present

## 2023-05-07 DIAGNOSIS — K8689 Other specified diseases of pancreas: Principal | ICD-10-CM

## 2023-05-07 DIAGNOSIS — R7989 Other specified abnormal findings of blood chemistry: Secondary | ICD-10-CM

## 2023-05-07 DIAGNOSIS — Z7189 Other specified counseling: Secondary | ICD-10-CM

## 2023-05-07 DIAGNOSIS — A419 Sepsis, unspecified organism: Secondary | ICD-10-CM | POA: Diagnosis not present

## 2023-05-07 LAB — COMPREHENSIVE METABOLIC PANEL
ALT: 173 U/L — ABNORMAL HIGH (ref 0–44)
AST: 283 U/L — ABNORMAL HIGH (ref 15–41)
Albumin: 2.1 g/dL — ABNORMAL LOW (ref 3.5–5.0)
Alkaline Phosphatase: 560 U/L — ABNORMAL HIGH (ref 38–126)
Anion gap: 10 (ref 5–15)
BUN: 15 mg/dL (ref 8–23)
CO2: 21 mmol/L — ABNORMAL LOW (ref 22–32)
Calcium: 8.3 mg/dL — ABNORMAL LOW (ref 8.9–10.3)
Chloride: 100 mmol/L (ref 98–111)
Creatinine, Ser: 0.34 mg/dL — ABNORMAL LOW (ref 0.61–1.24)
GFR, Estimated: >60 mL/min (ref >60)
Glucose, Bld: 104 mg/dL — ABNORMAL HIGH (ref 70–99)
Potassium: 3.8 mmol/L (ref 3.5–5.1)
Sodium: 131 mmol/L — ABNORMAL LOW (ref 135–145)
Total Bilirubin: 18.6 mg/dL (ref 0.3–1.2)
Total Protein: 5.2 g/dL — ABNORMAL LOW (ref 6.5–8.1)

## 2023-05-07 LAB — PROCALCITONIN: Procalcitonin: 0.63 ng/mL

## 2023-05-07 LAB — BPAM PLATELET PHERESIS
Blood Product Expiration Date: 202409172359
ISSUE DATE / TIME: 202409142326
Unit Type and Rh: 5100

## 2023-05-07 LAB — CBC WITH DIFFERENTIAL/PLATELET
Abs Immature Granulocytes: 1.4 10*3/uL — ABNORMAL HIGH (ref 0.00–0.07)
Basophils Absolute: 0 10*3/uL (ref 0.0–0.1)
Basophils Relative: 0 %
Eosinophils Absolute: 0 10*3/uL (ref 0.0–0.5)
Eosinophils Relative: 0 %
HCT: 24.9 % — ABNORMAL LOW (ref 39.0–52.0)
Hemoglobin: 8.3 g/dL — ABNORMAL LOW (ref 13.0–17.0)
Lymphocytes Relative: 25 %
Lymphs Abs: 2.3 10*3/uL (ref 0.7–4.0)
MCH: 30.5 pg (ref 26.0–34.0)
MCHC: 33.3 g/dL (ref 30.0–36.0)
MCV: 91.5 fL (ref 80.0–100.0)
Metamyelocytes Relative: 3 %
Monocytes Absolute: 0.5 10*3/uL (ref 0.1–1.0)
Monocytes Relative: 5 %
Myelocytes: 12 %
Neutro Abs: 5 10*3/uL (ref 1.7–7.7)
Neutrophils Relative %: 55 %
Platelets: 14 10*3/uL — CL (ref 150–400)
RBC: 2.72 MIL/uL — ABNORMAL LOW (ref 4.22–5.81)
RDW: 15.2 % (ref 11.5–15.5)
WBC: 9 10*3/uL (ref 4.0–10.5)
nRBC: 1.8 % — ABNORMAL HIGH (ref 0.0–0.2)

## 2023-05-07 LAB — PREPARE PLATELET PHERESIS: Unit division: 0

## 2023-05-07 LAB — CANCER ANTIGEN 19-9: CA 19-9: <2 U/mL (ref 0–35)

## 2023-05-07 LAB — PHOSPHORUS: Phosphorus: 2.1 mg/dL — ABNORMAL LOW (ref 2.5–4.6)

## 2023-05-07 LAB — MAGNESIUM: Magnesium: 2.5 mg/dL — ABNORMAL HIGH (ref 1.7–2.4)

## 2023-05-07 MED ORDER — SODIUM CHLORIDE 0.9% IV SOLUTION: Freq: Once | INTRAVENOUS | Status: AC

## 2023-05-07 NOTE — Progress Notes (Signed)
Mobility Specialist - Progress Note  Pre-mobility: 105 bpm HR, 95% SpO2 During mobility: 111 bpm HR, 92% SpO2 Post-mobility: 115 bpm HR, 96% SPO2   05/07/23 1112  Oxygen Therapy  O2 Device Nasal Cannula  O2 Flow Rate (L/min) 4 L/min  Mobility  Activity Transferred from bed to chair  Level of Assistance Moderate assist, patient does 50-74%  Assistive Device Front wheel walker  Distance Ambulated (ft) 2 ft  Range of Motion/Exercises Active Assistive  Activity Response Tolerated fair  $Mobility charge 1 Mobility  Mobility Specialist Start Time (ACUTE ONLY) 1100  Mobility Specialist Stop Time (ACUTE ONLY) 1112  Mobility Specialist Time Calculation (min) (ACUTE ONLY) 12 min   Pt was found in bed and agreeable to transfer to recliner chair. Pt was mod-A for bed mobility and CG for STS and transfer. Pt requiring safety cues for transfer. At EOS was left on recliner chair with all needs met. Call bell in reach. Chair alarm set up, although without batteries. RN notified.  Billey Chang Mobility Specialist

## 2023-05-07 NOTE — Plan of Care (Signed)
  Problem: Coping: Goal: Level of anxiety will decrease Outcome: Progressing   Problem: Elimination: Goal: Will not experience complications related to urinary retention Outcome: Progressing   Problem: Safety: Goal: Ability to remain free from injury will improve Outcome: Progressing   

## 2023-05-07 NOTE — Consult Note (Signed)
Consultation Note Date: 05/07/2023   Patient Name: Anthony Solis  DOB: 01/01/1956  MRN: [id number]  Age / Sex: 67 y.o., male   PCP: Default, Provider, MD Referring Physician: Lorin Glass, MD  Reason for Consultation: Establishing goals of care     Chief Complaint/History of Present Illness:   Patient is a 67 year old male with a past medical history of polysubstance abuse and schizophrenia who was admitted on 05/05/2023 from his living facility (where EMR states he has been since 1998) with complaints of not feeling well.  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  Patient has received medical management for sepsis likely secondary to pneumonia.  GI and oncology consulted regarding recommendations for pancreatic mass associated with hyperbilirubinemia and abnormal LFTs.  Palliative medicine team consulted to assist with complex medical decision making.  Extensive review of EMR prior to presenting to bedside.  GI not recommending any interventions as patient not medically appropriate for these based on his current medical status.  GI recommended consideration of hospice referral.  Oncology, Dr. Myna Hidalgo, also evaluated patient.  Oncologist believing that patient's prognosis is less than 1 month and also recommends hospice referral.  All providers who have engaged with patient note lack of capacity with regards to engage in complex medical decision making. TOC note on 05/06/2023 noted that patient's NOK/uncle unable to be contacted as number is disconnected.  TOC contacting [facility] where [facility] receives care.  Presented to bedside to meet with patient.  Patient seen sitting up in chair at bedside.  Introduced myself as a member of the palliative medicine team.  Patient able to engage in simple conversation though easily confused and unable to detail hospital status or  reason for admission.  No family or friends present at bedside.  Unable to engage patient in complex medical decision-making due to patient's medical status.  Primary Diagnoses  Present on Admission:  Sepsis due to pneumonia [facility])   Past Medical History:  Diagnosis Date   Drug abuse (HCC)    GERD (gastroesophageal reflux disease)    Hyperlipidemia    Hypertension    IBS (irritable bowel syndrome)    Polysubstance abuse (HCC)    Schizophrenia, schizo-affective type (HCC)    Thyroid disease    Social History   Socioeconomic History   Marital status: Married    Spouse name: Not on file   Number of children: Not on file   Years of education: Not on file   Highest education level: Not on file  Occupational History   Occupation: unemployed/disabled  Tobacco Use   Smoking status: Every Day    Current packs/day: 0.50    Average packs/day: 0.5 packs/day for 45.0 years (22.5 ttl pk-yrs)    Types: Cigarettes, Cigars   Smokeless tobacco: Never  Vaping Use   Vaping status: Never Used  Substance and Sexual Activity   Alcohol use: Yes    Alcohol/week: 7.0 standard drinks of alcohol    Types: 7 Glasses of wine per week    Comment: drinks wine daily per pt   Drug use: Not Currently    Types: Marijuana, Codeine    Comment: last inhaled cocaine was ~20 years ago   Sexual activity: Yes    Partners: Female    Comment: declined condoms 06/2019  Other Topics Concern   Not on file  Social History Narrative   Patient reports multiple incarcerations for "murder"; he now lives in an ALF  vs. SNF at [facility]   Social Determinants of Health   Financial Resource Strain: Not on file  Food Insecurity: No Food Insecurity (05/06/2023)   Hunger Vital Sign    Worried About Running Out of Food in the Last Year: Never true    Ran Out of Food in the Last Year: Never true  Transportation Needs: No Transportation Needs (05/06/2023)   PRAPARE - Scientist, research (physical sciences) (Medical): No    Lack of Transportation (Non-Medical): No  Physical Activity: Not on file  Stress: Not on file  Social Connections: Not on file   Family History  Family history unknown: Yes   Scheduled Meds:  benztropine  0.5 mg Oral BID   cloZAPine  200 mg Oral QHS   cloZAPine  300 mg Oral Daily   influenza vac split trivalent PF  0.5 mL Intramuscular Tomorrow-1000   levothyroxine  50 mcg Oral Q0600   multivitamin with minerals  1 tablet Oral Daily   pneumococcal 20-valent conjugate vaccine  0.5 mL Intramuscular Tomorrow-1000   Continuous Infusions:  sodium chloride 50 mL/hr (05/07/23 0507)   azithromycin Stopped (05/06/23 1823)   cefTRIAXone (ROCEPHIN)  IV Stopped (05/06/23 1456)   PRN Meds:.albuterol, ondansetron **OR** ondansetron (ZOFRAN) IV, traMADol, traZODone No Known Allergies CBC:    Component Value Date/Time   WBC 9.0 05/07/2023 0509   HGB 8.3 (L) 05/07/2023 0509   HCT 24.9 (L) 05/07/2023 0509   HCT 34.7 (L) 03/12/2019 0101   PLT 14 (LL) 05/07/2023 0509   MCV 91.5 05/07/2023 0509   NEUTROABS 5.0 05/07/2023 0509   LYMPHSABS 2.3 05/07/2023 0509   MONOABS 0.5 05/07/2023 0509   EOSABS 0.0 05/07/2023 0509   BASOSABS 0.0 05/07/2023 0509   Comprehensive Metabolic Panel:    Component Value Date/Time   NA 131 (L) 05/07/2023 0509   K 3.8 05/07/2023 0509   CL 100 05/07/2023 0509   CO2 21 (L) 05/07/2023 0509   BUN 15 05/07/2023 0509   CREATININE 0.34 (L) 05/07/2023 0509   CREATININE 1.13 01/24/2021 0950   GLUCOSE 104 (H) 05/07/2023 0509   CALCIUM 8.3 (L) 05/07/2023 0509   AST 283 (H) 05/07/2023 0509   ALT 173 (H) 05/07/2023 0509   ALT 62 (H) 01/24/2021 0950   ALKPHOS 560 (H) 05/07/2023 0509   BILITOT 18.6 (HH) 05/07/2023 0509   PROT 5.2 (L) 05/07/2023 0509   ALBUMIN 2.1 (L) 05/07/2023 0509    Physical Exam: Vital Signs: BP 118/74 (BP Location: Left Arm)   Pulse (!) 107   Temp 98.5 F (36.9 C) (Oral)   Resp 17   Ht 5\' 9"  (1.753 m)   Wt  65.5 kg   SpO2 100%   BMI 21.32 kg/m  SpO2: SpO2: 100 % O2 Device: O2 Device: Nasal Cannula O2 Flow Rate: O2 Flow Rate (L/min): 4 L/min Intake/output summary:  Intake/Output Summary (Last 24 hours) at 05/07/2023 0925 Last data filed at 05/07/2023 0900 Gross per 24 hour  Intake 2687.04 ml  Output 1400 ml  Net 1287.04 ml   LBM: Last BM Date : 05/06/23 Baseline Weight: Weight: 65.5 kg Most recent weight: Weight: 65.5 kg  General: NAD, sitting in bedside chair, awake, confused Eyes: no drainage noted HENT: Poor dentition Cardiovascular: Tachycardia noted Respiratory: no increased work of breathing noted, not in respiratory distress Abdomen: distended Neuro: Awake, confused Psych: Not appropriately able to engage in questions          Palliative Performance Scale: 50%  Additional Data Reviewed: Recent Labs    05/06/23 0514 05/07/23 0509  WBC 11.4* 9.0  HGB 9.2* 8.3*  PLT 40* 14*  NA 131* 131*  BUN 14 15  CREATININE <0.30* 0.34*    Imaging: US Abdomen Limited RUQ (LIVER/GB) CLINICAL DATA:  [id number] Transaminitis [id number]  EXAM: ULTRASOUND ABDOMEN LIMITED  COMPARISON:  12/04/2016  FINDINGS: Intrahepatic and extrahepatic biliary ductal dilatation is identified. CBD 1 cm. No hepatic parenchymal lesions are seen. Gallbladder demonstrates echogenic sludge and hypervascularity which could indicate a mucosal lesion. The pancreatic mass observed on CT from earlier the same day was not evaluated sonographically. No gallbladder wall thickening or pericholecystic fluid is identified. Hepatopetal portal vein flow.  IMPRESSION: 1. Hypervascular process in the gallbladder concerning for a potential neoplastic etiology. 2. Gallbladder appears mildly distended with evidence of echogenic sludge. 3. Intra- and extrahepatic biliary ductal dilatation.  Electronically Signed   By: Layla Maw M.D.   On: 05/05/2023 16:24 CT CHEST ABDOMEN PELVIS W  CONTRAST CLINICAL DATA:  Airspace opacity suspicious for pneumonia. Productive cough. Weakness. Disorientation. Pancreatic mass.  * Tracking Code: BO *  EXAM: CT CHEST, ABDOMEN, AND PELVIS WITH CONTRAST  TECHNIQUE: Multidetector CT imaging of the chest, abdomen and pelvis was performed following the standard protocol during bolus administration of intravenous contrast.  RADIATION DOSE REDUCTION: This exam was performed according to the departmental dose-optimization program which includes automated exposure control, adjustment of the mA and/or kV according to patient size and/or use of iterative reconstruction technique.  CONTRAST:  OMNIPAQUE IOHEXOL 300 MG/ML  SOLN  COMPARISON:  Chest radiograph 05/05/2023 and abdominal ultrasound 12/04/2016  FINDINGS: CT CHEST FINDINGS  Cardiovascular: Mild atheromatous vascular calcification of the aortic arch. Mild mitral valve calcification.  Mediastinum/Nodes: Air-level in mildly dilated distal esophagus, possibly from dysmotility or reflux.  Lungs/Pleura: Centrilobular and paraseptal emphysema.  Hazy confluent central airspace opacity with faint nodular component most confluent in the right lower lobe but also present in the right upper lobe and right middle lobe, possibilities may include aspiration pneumonitis or multilobar pneumonia. Left lung is clear. No pleural effusion.  Musculoskeletal: Unremarkable  CT ABDOMEN PELVIS FINDINGS  Hepatobiliary: Severe intrahepatic and extrahepatic biliary dilatation, common bile duct 1.6 cm in diameter, extending down to a pancreatic head mass. Mildly distended gallbladder without gallbladder wall thickening. No definite hepatic metastatic lesion identified.  Pancreas: Dorsal pancreatic duct dilatation extending to the level of a pancreatic head mass. Mass characteristics:  Size: 4.8 by 3.0 by 3.6 cm  Location: Head and uncinate process  Characterization:  Solid  Enhancement: Hypoenhancing  Other Characteristics: The main pancreatic duct is distended.  Local extent of mass: The mass substantially abuts the transverse duodenum.  Vascular Involvement: The mass extends around the superior mesenteric artery, image 71 series 2. The mass abuts and substantially narrows the superior mesenteric vein on image 71 of series 2 with the upper margin of the mass abutting the confluence of the SMV and splenic vein. The posterior margin of the mass abuts the inferior vena cava for example on image 72 of series 2.  Variant hepatic artery anatomy: Conventional  Bile Duct Involvement: Prominent dilatation of the biliary tree to the level of the mass.  Variant biliary anatomy: Absent  Adjacent Nodes: Porta hepatis node on image 68 series 2 measures 1.5 cm in short axis.  Omental/Peritoneal Disease: Absent  Distant Metastases: Nonspecific hypodense splenic lesion, cannot exclude metastatic lesion.  Spleen: Centrally in the spleen a 1.6 by 1.5 cm lesion  is observed, this is hypodense relative to the splenic parenchyma although not simple. While this could be a benign lesion such as hemangioma, metastatic pancreatic adenocarcinoma is not excluded.  Adrenals/Urinary Tract: 1.4 by 1.8 cm left adrenal mass, relative washout of 48%, favoring adrenal adenoma. No further imaging workup of this lesion is indicated. The kidneys appear unremarkable. Urinary bladder unremarkable.  Stomach/Bowel: Prominent stool throughout the colon favors constipation. No dilated bowel.  Vascular/Lymphatic: Vascular encasement of the superior mesenteric artery by the tumor is noted above. The tumor abuts the SMV and IVC.  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  Reproductive: Prostatomegaly.  Other: No supplemental non-categorized findings.  Musculoskeletal: Unremarkable  IMPRESSION: 1. 4.8 cm solid hypoenhancing pancreatic head mass  with vascular encasement of the superior mesenteric artery and abutment of the SMV and IVC. The mass substantially abuts the transverse duodenum. 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 2. Severe intrahepatic and extrahepatic biliary dilatation extending down to the level of the pancreatic mass. The dorsal pancreatic duct is also dilated. 3. 1.6 cm hypodense splenic lesion, cannot exclude metastatic pancreatic adenocarcinoma. 4. 1.8 cm left adrenal mass, relative washout of 48%, favoring adrenal adenoma. No further imaging workup of this lesion is indicated. 5. Hazy confluent central airspace opacity with faint nodular component most confluent in the right lower lobe but also present in the right upper lobe and right middle lobe, possibilities may include aspiration pneumonitis or multilobar pneumonia. 6. Air-level in mildly dilated distal esophagus, possibly from dysmotility or reflux. 7. Prominent stool throughout the colon favors constipation. 8. Prostatomegaly. 9. 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  Aortic Atherosclerosis (ICD10-I70.0) and Emphysema (ICD10-J43.9).  Electronically Signed   By: Gaylyn Rong M.D.   On: 05/05/2023 15:52 DG Chest Portable 1 View CLINICAL DATA:  Dyspnea  EXAM: PORTABLE CHEST 1 VIEW  COMPARISON:  03/11/2019  FINDINGS: No pneumothorax or effusion. Normal cardiopericardial silhouette. Consolidative opacity in the medial right lung base. Overlapping cardiac leads. Degenerative changes of the spine and pelvis.  IMPRESSION: Ill-defined consolidative opacity in the medial right lung base. Acute infiltrates possible. Recommend follow-up to confirm clearance.  Electronically Signed   By: Karen Kays M.D.   On: 05/05/2023 13:52    I personally reviewed recent imaging.   Palliative Care Assessment and Plan Summary of Established Goals of Care and Medical Treatment Preferences    Patient is a 67 year old male with a past medical history of polysubstance abuse and schizophrenia who was admitted on 05/05/2023 from his living facility (where EMR states he has been since 1998) with complaints of not feeling well.  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  Patient has received medical management for sepsis likely secondary to pneumonia.  GI and oncology consulted regarding recommendations for pancreatic mass associated with hyperbilirubinemia and abnormal LFTs.  Palliative medicine team consulted to assist with complex medical decision making.  # Complex medical decision making/goals of care  -Patient unable to participate in complex medical decision-making due to his underlying medical status.  -Appreciate TOC's assistance in determination of NOK and contact information for any family to assist with determining medical decisions for patient's care moving forward.    - See: [facility] Statutes Chapter 90. Medicine and Allied Occupations  90-21.13. Informed consent to health care treatment or procedure. In brief summary, this statute outlines that the following persons, in order  indicated, are authorized to consent to medical treatment on behalf of the patient who does not demonstrate capacity to do so: Legally assigned guardian appointed by the court > healthcare agent appointed by legal healthcare power of attorney > healthcare agent appointed by the patient > legal spouse > majority of the patient's reasonably available parents and children who are at least 25 years of age > individual who has an established relationship with the patient, who is acting in good faith on behalf of the patient, and who can reliably convey the patient's wishes > attending physician with confirmation by another physician of the patient's condition and the necessity for treatment (unless in  urgent/emergent situation)   --  Code Status: Full Code    -Could consider enacting two physician DNR/DNI code status based on multiple providers noting patient's metastatic caner being an irreversible condition which will lead to end of life and so interventions such as cardiac resuscitation and intubation/ventilation would not reverse underlying cancer condition which is leading to patient's death.   # Psycho-social/Spiritual Support:  - Support System: TOC noted that NOK (Uncle) listed in chart unable to be contacted.   # Discharge Planning:  To Be Determined  Thank you for allowing the palliative care team to participate in the care [facility].  Alvester Morin, DO Palliative Care Provider PMT # [phone]  If patient remains symptomatic despite maximum doses, please call PMT at [phone] between 0700 and 1900. Outside of these hours, please call attending, as PMT does not have night coverage.  This provider spent a total of 81 minutes providing patient's care.  Includes review of EMR, discussing care with other staff members involved in patient's medical care, obtaining relevant history and information from patient and/or patient's family, and personal review of imaging and lab work. Greater than 50% of the time was spent counseling and coordinating care related to the above assessment and plan.    *Please note that this is a verbal dictation therefore any spelling or grammatical errors are due to the "[employer]" system interpretation.

## 2023-05-07 NOTE — Progress Notes (Signed)
   05/07/23 2128  Assess: MEWS Score  Temp 98.2 F (36.8 C)  BP 123/74  MAP (mmHg) 89  Pulse Rate (!) 107  Resp (!) 24  SpO2 95 %  O2 Device Nasal Cannula  O2 Flow Rate (L/min) 4 L/min  Assess: MEWS Score  MEWS Temp 0  MEWS Systolic 0  MEWS Pulse 1  MEWS RR 1  MEWS LOC 0  MEWS Score 2  MEWS Score Color Yellow  Assess: if the MEWS score is Yellow or Red  Were vital signs accurate and taken at a resting state? Yes  Does the patient meet 2 or more of the SIRS criteria? Yes  Does the patient have a confirmed or suspected source of infection? Yes  MEWS guidelines implemented  Yes, yellow  Treat  MEWS Interventions Considered administering scheduled or prn medications/treatments as ordered  Take Vital Signs  Increase Vital Sign Frequency  Yellow: Q2hr x1, continue Q4hrs until patient remains green for 12hrs  Escalate  MEWS: Escalate Yellow: Discuss with charge nurse and consider notifying provider and/or RRT  Notify: Charge Nurse/RN  Name of Charge Nurse/RN Notified Hilda,RN  Provider Notification  Provider Name/Title James,Daniels,NP  Date Provider Notified 05/07/23  Time Provider Notified 2100  Method of Notification Page Erskine Speed)  Notification Reason Other (Comment) (yellow mews)  Provider response No new orders  Date of Provider Response 05/07/23  Time of Provider Response 2101  Assess: SIRS CRITERIA  SIRS Temperature  0  SIRS Pulse 1  SIRS Respirations  1  SIRS WBC 0  SIRS Score Sum  2   Will continue with plan of care

## 2023-05-07 NOTE — Plan of Care (Signed)

## 2023-05-07 NOTE — Progress Notes (Signed)
Patient refused lab draw for repeat platelet count. Explained necessity of knowing platelet count and educated on how platelets work to prevent bleeding. Patient still refused.

## 2023-05-07 NOTE — Progress Notes (Signed)
UNASSIGNED PATIENT Subjective: Patient is 67 year old black male with history of schizophrenia and polysubstance abuse admitted on 05/05/2023 with imaging revealing pancreatic adenocarcinoma with mets and multifocal pneumonia.  Patient is unable to give much history and palliative care team has been consulted  Objective: Vital signs in last 24 hours: Temp:  [98.3 F (36.8 C)-98.7 F (37.1 C)] 98.6 F (37 C) (09/16 1300) Pulse Rate:  [100-107] 100 (09/16 1300) Resp:  [17-20] 20 (09/16 1121) BP: (107-123)/(71-82) 110/82 (09/16 1300) SpO2:  [97 %-100 %] 98 % (09/16 1121) Last BM Date : 05/06/23  Intake/Output from previous day: 09/15 0701 - 09/16 0700 In: 2687 [P.O.:1080; I.V.:1256.6; IV Piggyback:350.4] Out: 750 [Urine:750] Intake/Output this shift: Total I/O In: 530 [P.O.:200; I.V.:30; Blood:300] Out: 650 [Urine:650]  General appearance: Patient is appears older than his stated age seems somnolent and not easily arousable Resp: clear to auscultation bilaterally; no rales rhonchi or wheezing Cardio: Tachycardic regular rate and rhythm, S1, S2 normal, no murmur, click, rub or gallop GI: Stented and protuberant with no evidence of organomegaly hypoactive bowel sounds Extremities: extremities normal, atraumatic, no cyanosis or edema  Lab Results: Recent Labs    05/05/23 1653 05/06/23 0514 05/07/23 0509  WBC 12.2* 11.4* 9.0  HGB 9.1* 9.2* 8.3*  HCT 26.6* 27.5* 24.9*  PLT 15* 40* 14*   BMET Recent Labs    05/05/23 1335 05/06/23 0514 05/07/23 0509  NA 125* 131* 131*  K 4.1 3.8 3.8  CL 89* 100 100  CO2 23 18* 21*  GLUCOSE 114* 104* 104*  BUN 21 14 15   CREATININE 0.46* <0.30* 0.34*  CALCIUM 9.0 8.4* 8.3*   LFT Recent Labs    05/07/23 0509  PROT 5.2*  ALBUMIN 2.1*  AST 283*  ALT 173*  ALKPHOS 560*  BILITOT 18.6*   Studies/Results: US Abdomen Limited RUQ (LIVER/GB)  Result Date: 05/05/2023 CLINICAL DATA:  [id number] Transaminitis [id number] EXAM: ULTRASOUND ABDOMEN  LIMITED COMPARISON:  12/04/2016 FINDINGS: Intrahepatic and extrahepatic biliary ductal dilatation is identified. CBD 1 cm. No hepatic parenchymal lesions are seen. Gallbladder demonstrates echogenic sludge and hypervascularity which could indicate a mucosal lesion. The pancreatic mass observed on CT from earlier the same day was not evaluated sonographically. No gallbladder wall thickening or pericholecystic fluid is identified. Hepatopetal portal vein flow. IMPRESSION: 1. Hypervascular process in the gallbladder concerning for a potential neoplastic etiology. 2. Gallbladder appears mildly distended with evidence of echogenic sludge. 3. Intra- and extrahepatic biliary ductal dilatation. Electronically Signed   By: Layla Maw M.D.   On: 05/05/2023 16:24   CT CHEST ABDOMEN PELVIS W CONTRAST  Result Date: 05/05/2023 CLINICAL DATA:  Airspace opacity suspicious for pneumonia. Productive cough. Weakness. Disorientation. Pancreatic mass. * Tracking Code: BO * EXAM: CT CHEST, ABDOMEN, AND PELVIS WITH CONTRAST TECHNIQUE: Multidetector CT imaging of the chest, abdomen and pelvis was performed following the standard protocol during bolus administration of intravenous contrast. RADIATION DOSE REDUCTION: This exam was performed according to the departmental dose-optimization program which includes automated exposure control, adjustment of the mA and/or kV according to patient size and/or use of iterative reconstruction technique. CONTRAST:  OMNIPAQUE IOHEXOL 300 MG/ML  SOLN COMPARISON:  Chest radiograph 05/05/2023 and abdominal ultrasound 12/04/2016 FINDINGS: CT CHEST FINDINGS Cardiovascular: Mild atheromatous vascular calcification of the aortic arch. Mild mitral valve calcification. Mediastinum/Nodes: Air-level in mildly dilated distal esophagus, possibly from dysmotility or reflux. Lungs/Pleura: Centrilobular and paraseptal emphysema. Hazy confluent central airspace opacity with faint nodular component most  confluent in the right lower lobe  but also present in the right upper lobe and right middle lobe, possibilities may include aspiration pneumonitis or multilobar pneumonia. Left lung is clear. No pleural effusion. Musculoskeletal: Unremarkable CT ABDOMEN PELVIS FINDINGS Hepatobiliary: Severe intrahepatic and extrahepatic biliary dilatation, common bile duct 1.6 cm in diameter, extending down to a pancreatic head mass. Mildly distended gallbladder without gallbladder wall thickening. No definite hepatic metastatic lesion identified. Pancreas: Dorsal pancreatic duct dilatation extending to the level of a pancreatic head mass. Mass characteristics: Size: 4.8 by 3.0 by 3.6 cm Location: Head and uncinate process Characterization: Solid Enhancement: Hypoenhancing Other Characteristics: The main pancreatic duct is distended. Local extent of mass: The mass substantially abuts the transverse duodenum. Vascular Involvement: The mass extends around the superior mesenteric artery, image 71 series 2. The mass abuts and substantially narrows the superior mesenteric vein on image 71 of series 2 with the upper margin of the mass abutting the confluence of the SMV and splenic vein. The posterior margin of the mass abuts the inferior vena cava for example on image 72 of series 2. Variant hepatic artery anatomy: Conventional Bile Duct Involvement: Prominent dilatation of the biliary tree to the level of the mass. Variant biliary anatomy: Absent Adjacent Nodes: Porta hepatis node on image 68 series 2 measures 1.5 cm in short axis. Omental/Peritoneal Disease: Absent Distant Metastases: Nonspecific hypodense splenic lesion, cannot exclude metastatic lesion. Spleen: Centrally in the spleen a 1.6 by 1.5 cm lesion is observed, this is hypodense relative to the splenic parenchyma although not simple. While this could be a benign lesion such as hemangioma, metastatic pancreatic adenocarcinoma is not excluded. Adrenals/Urinary Tract: 1.4 by 1.8  cm left adrenal mass, relative washout of 48%, favoring adrenal adenoma. No further imaging workup of this lesion is indicated. The kidneys appear unremarkable. Urinary bladder unremarkable. Stomach/Bowel: Prominent stool throughout the colon favors constipation. No dilated bowel. Vascular/Lymphatic: Vascular encasement of the superior mesenteric artery by the tumor is noted above. The tumor abuts the SMV and IVC. 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 Reproductive: Prostatomegaly. Other: No supplemental non-categorized findings. Musculoskeletal: Unremarkable IMPRESSION: 1. 4.8 cm solid hypoenhancing pancreatic head mass with vascular encasement of the superior mesenteric artery and abutment of the SMV and IVC. The mass substantially abuts the transverse duodenum. 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 2. Severe intrahepatic and extrahepatic biliary dilatation extending down to the level of the pancreatic mass. The dorsal pancreatic duct is also dilated. 3. 1.6 cm hypodense splenic lesion, cannot exclude metastatic pancreatic adenocarcinoma. 4. 1.8 cm left adrenal mass, relative washout of 48%, favoring adrenal adenoma. No further imaging workup of this lesion is indicated. 5. Hazy confluent central airspace opacity with faint nodular component most confluent in the right lower lobe but also present in the right upper lobe and right middle lobe, possibilities may include aspiration pneumonitis or multilobar pneumonia. 6. Air-level in mildly dilated distal esophagus, possibly from dysmotility or reflux. 7. Prominent stool throughout the colon favors constipation. 8. Prostatomegaly. 9. 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 Aortic Atherosclerosis (ICD10-I70.0) and Emphysema (ICD10-J43.9). Electronically Signed   By: Gaylyn Rong M.D.   On: 05/05/2023 15:52    Medications: I have reviewed the patient's current medications. Prior to  Admission:  Medications Prior to Admission  Medication Sig Dispense Refill Last Dose   amLODipine (NORVASC) 2.5 MG tablet Take 2.5 mg by mouth daily.   05/05/2023   benztropine (COGENTIN) 0.5 MG tablet Take 1  tablet (0.5 mg total) by mouth 2 (two) times daily. For EPS 60 tablet 0 05/05/2023   cloZAPine (CLOZARIL) 100 MG tablet Take 2 tablets (200 mg total) by mouth 2 (two) times daily. For mood control (Patient taking differently: Take 200-300 mg by mouth 2 (two) times daily. Take 3 tablets (300 mg) in the morning and 2 tablets (200 mg) at bedtime.) 60 tablet 0 05/05/2023   dicyclomine (BENTYL) 20 MG tablet Take 20 mg by mouth 3 (three) times daily.    05/05/2023   docusate sodium (COLACE) 100 MG capsule Take 100 mg by mouth daily.   05/05/2023   fenofibrate (TRICOR) 48 MG tablet Take 48 mg by mouth daily.   05/05/2023   haloperidol (HALDOL) 2 MG/ML solution Take 4 mg by mouth as needed for agitation.   Past Month   levothyroxine (SYNTHROID, LEVOTHROID) 50 MCG tablet Take 50 mcg by mouth daily.   05/05/2023   mirtazapine (REMERON) 30 MG tablet Take 1 tablet (30 mg total) by mouth at bedtime. For depression 30 tablet 0 05/04/2023   Multiple Vitamin (MULTIVITAMIN) tablet Take 1 tablet by mouth daily.   05/05/2023   NONFORMULARY OR COMPOUNDED ITEM Apply 1 mL topically as needed (Agitation). Compounded Ativan/Benadryl/Haldol 2mg -25mg -2mg /mL   Past Month   QUEtiapine (SEROQUEL) 200 MG tablet Take 1 tablet (200 mg total) by mouth at bedtime. For mood control (Patient taking differently: Take 200 mg by mouth in the morning. For mood control) 30 tablet 0 05/05/2023   QUEtiapine (SEROQUEL) 300 MG tablet Take 300 mg by mouth at bedtime.   05/04/2023   senna (SENOKOT) 8.6 MG tablet Take 1 tablet by mouth at bedtime.   05/04/2023   simvastatin (ZOCOR) 10 MG tablet Take 10 mg by mouth at bedtime.   05/04/2023   Scheduled:  benztropine  0.5 mg Oral BID   cloZAPine  200 mg Oral QHS   cloZAPine  300 mg Oral Daily    influenza vac split trivalent PF  0.5 mL Intramuscular Tomorrow-1000   levothyroxine  50 mcg Oral Q0600   multivitamin with minerals  1 tablet Oral Daily   pneumococcal 20-valent conjugate vaccine  0.5 mL Intramuscular Tomorrow-1000   Continuous:  sodium chloride 50 mL/hr (05/07/23 0507)   azithromycin Stopped (05/06/23 1823)   cefTRIAXone (ROCEPHIN)  IV Stopped (05/07/23 1529)   ZOX:[id number], ondansetron **OR** ondansetron (ZOFRAN) IV, traMADol, traZODone  Assessment/Plan: 1) Obstructive jaundice with suspected pancreatic adenocarcinoma with metastatic disease and thrombocytopenia, and the setting of schizophrenia and polysubstance abuse and poor functional status with encephalopathy-agree with palliative care please call if further assistance is needed   LOS: 2 days   Charna Elizabeth 05/07/2023, 2:15 PM

## 2023-05-07 NOTE — TOC Initial Note (Addendum)
Transition of Care [facility]) - Initial/Assessment Note    Patient Details  Name: Anthony Solis MRN: [id number] Date of Birth: 11/25/1955  [facility] CM/SW Contact:    Howell Rucks, RN Phone Number: 05/07/2023, 10:32 AM  Clinical Narrative:  Call to Anthony Solis, sw Anthony Solis, introduced [facility] [place], Anthony Solis confirmed pt resides in Group Home and is able to return at discharge. Anthony Solis requests FL2 and DC summary to be faxed to [phone] or sent with pt at discharge, reports [facility] does not provide transporation, pt will need EMS transport home. TOC consulted for HH/DME needs, request sent to attending to enter PT eval.  TOC will continue to follow.       3:30pm- TOC included in Teams chat regarding the process of doing physicians DNR/DNI? Pt with recent dx of   Suspected pancreatic adenocarcinoma with metastasis Obstructive jaundice. Request TOC to confirm it patient has any next of kin contact.  NCM called to [facility] where pt has resided since 1998, spoke with Anthony Solis, Public relations account executive, per Anthony Solis, to her knowledge, pt is his own guardian and makes his own healthcare decisions, confirmed pt has no Living Will, HCPOA or Advanced Directive on file at the facility. Informed Anthony Solis patient is showing Marital Status as Married in our system and has an Education officer, communityMolly Maduro Solis at [phone] listed in contacts. Anthony Solis confirmed she is not aware of a spouse or Uncle, or any other family contact for the patient. NCM called to contact on file, Anthony Solis (Uncle) at  [phone], number is not in service. Team notified.          Patient Goals and CMS Choice            Expected Discharge Plan and Services                                              Prior Living Arrangements/Services                       Activities of Daily Living Home Assistive Devices/Equipment: None ADL Screening (condition at time of  admission) Patient's cognitive ability adequate to safely complete daily activities?: Yes Is the patient deaf or have difficulty hearing?: No Does the patient have difficulty seeing, even when wearing glasses/contacts?: No Does the patient have difficulty concentrating, remembering, or making decisions?: Yes Patient able to express need for assistance with ADLs?: Yes Does the patient have difficulty dressing or bathing?: Yes Independently performs ADLs?: No Communication: Independent Dressing (OT): Needs assistance Is this a change from baseline?: Change from baseline, expected to last <3days Grooming: Needs assistance Is this a change from baseline?: Change from baseline, expected to last <3 days Bathing: Needs assistance Is this a change from baseline?: Change from baseline, expected to last <3 days In/Out Bed: Needs assistance Is this a change from baseline?: Change from baseline, expected to last <3 days Walks in Home: Independent Does the patient have difficulty walking or climbing stairs?: Yes Weakness of Legs: Both Weakness of Arms/Hands: None  Permission Sought/Granted                  Emotional Assessment              Admission diagnosis:  Pancreatic mass [K86.89] Sepsis due to pneumonia (HCC) [J18.9, A41.9] Patient  Active Problem List   Diagnosis Date Noted   Sepsis due to pneumonia (HCC) 05/05/2023   Hallucinations 08/22/2022   Hypokalemia 03/13/2019   HTN (hypertension) 03/13/2019   Elevated CK 03/13/2019   Elevated liver enzymes 03/13/2019   Hyperbilirubinemia    Hypothyroidism 12/03/2016   Schizophrenia (HCC) 12/03/2016   Syncope 12/03/2016   Hyponatremia 12/03/2016   Orthostatic hypotension 12/03/2016   Chronic hepatitis C without hepatic coma (HCC) 2018   PCP:  Default, Provider, MD Pharmacy:   [facility] STORE [phone] - [place], [place] - [address] AT [facility] [address] [place] [address] Phone: [phone] Fax:  [phone]  [place] [facility] [address] [place] [address] Phone: [phone] Fax: [phone]     Social Determinants of Health (SDOH) Social History: SDOH Screenings   Food Insecurity: No Food Insecurity (05/06/2023)  Housing: Medium Risk (05/06/2023)  Transportation Needs: No Transportation Needs (05/06/2023)  Utilities: Not At Risk (05/06/2023)  Depression (PHQ2-9): Low Risk  (01/24/2021)  Tobacco Use: High Risk (05/06/2023)   SDOH Interventions: Housing Interventions: Intervention Not Indicated, Inpatient TOC   Readmission Risk Interventions    05/07/2023   10:01 AM  Readmission Risk Prevention Plan  Transportation Screening Complete  PCP or Specialist Appt within 5-7 Days Complete  Home Care Screening Complete  Medication Review (RN CM) Complete

## 2023-05-07 NOTE — Progress Notes (Signed)
PROGRESS NOTE  Anthony Solis  DOB: [birth year]  PCP: Default, Provider, MD ZOX:[id number]  DOA: 05/05/2023  LOS: 2 days  Hospital Day: 3  Brief narrative: Anthony Solis is a 67 y.o. male with PMH significant for HTN, HLD, schizophrenia, polysubstance abuse, GERD, hypothyroidism, IBS who is a long-term resident at [facility] since 1998. 9/14, patient was brought to the ED for evaluation of altered mental status, generalized weakness  In the ED, patient was alert to self only. He was noted to have significant icterus of eyes and skin. Labs showed leukocytosis, elevated LFTs, total bilirubin elevated to 19.5 CT scan abdomen/pelvis showed A 4.8 cm solid pancreatic head mass with vascular encasement of the superior mesenteric artery and abutment of the SMV and IVC.  The mass substantially abuts the transverse duodenum as well.  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   Severe intrahepatic and extrahepatic biliary dilatation secondary to the mass Possible metastasis to spleen, left adrenal gland Possible multilobar pneumonia/aspiration pneumonitis on right lung lobes Prominent stool throughout the colon  Admitted to [facility] GI Dr. Dulce Sellar and oncology Dr. Myna Hidalgo were consulted  Subjective: Patient was seen and examined this morning.  Middle-aged African-American male.  Looks older for his age.  Sitting up in recliner.  Knows he is in the hospital.  Unable to answer any other questions appropriately.  Not in pain or distress. Chart reviewed No fever last 24 hours.  Remains tachycardic. On 4 L oxygen by nasal cannula Most recent labs from this morning with WC count normal at 9, hemoglobin low at 8.3, platelet low at 14, sodium low at 131, phosphorus low at 2.1, further rising LFTs and total bili  Assessment and plan: Suspected pancreatic adenocarcinoma with metastasis Obstructive jaundice Sent for altered mental status, weakness, lethargy Labs and CT scan findings as above  showing possible pancreatic adenocarcinoma with spleen and adrenal metastasis Significantly elevated LFTs and bilirubin Seen by GI and oncology.   Per oncology Dr. Gustavo Lah note, patient is not a candidate for any systemic therapy.  Furthermore, he seems to have inadequate mental competence to make any decision about his care. palliative care consultation requested. Recent Labs  Lab 05/05/23 1335 05/05/23 1653 05/06/23 0514 05/07/23 0509  AST 273*  --  247* 283*  ALT 184*  --  158* 173*  ALKPHOS 690*  --  557* 560*  BILITOT 19.5*  --  16.8* 18.6*  PROT 6.9  --  5.4* 5.2*  ALBUMIN 2.8*  --  2.4* 2.1*  AMMONIA 42*  --   --   --   PLT 19* 15* 40* 14*   Thrombocytopenia Platelet level significantly low.  Down to 14 today. 1 unit of platelet transfusion was given in the ED.  1 more unit ordered this morning.  Sepsis POA Multifocal pneumonia Aspiration pneumonia Met sepsis criteria with tachycardia, leukocytosis, lactic acidosis and right sided infiltrates which could be due to aspiration pneumonia  Continue Rocephin and IV azithromycin started empirically.   Pending blood culture report WBC count and lactic level improved. Recent Labs  Lab 05/05/23 1335 05/05/23 1345 05/05/23 1645 05/05/23 1653 05/06/23 0514 05/06/23 1354 05/06/23 1633 05/07/23 0509  WBC 14.9*  --   --  12.2* 11.4*  --   --  9.0  LATICACIDVEN  --  3.2* 3.1*  --   --  1.5 1.7  --   PROCALCITON  --   --   --   --   --   --   --  0.63    Acute hypoxic respiratory failure secondary to above Currently on 4 L with O2 saturation of 99 to 100%.  Wean down as tolerated   Hyponatremia Possibly related to low oral intake.  Gradually improving IV fluid. Recent Labs  Lab 05/05/23 1335 05/06/23 0514 05/07/23 0509  NA 125* 131* 131*   Acute metabolic encephalopathy  H/o schizophrenia Acute alteration mental status likely due to acute medical issues.  Notably, patient had multiple hospital admissions in the  past, and noted aggressive behavior PTA meds- Haldol, Remeron, Seroquel, benztropine, clozapine Currently continued on clozapine and benztropine.  Other meds on hold currently because of altered mentation Fall precautions  Hypertension PTA meds- amlodipine.   Currently on hold.  Monitor blood pressure   Hypothyroidism Continue    Mobility: PT eval  Goals of care   Code Status: Full Code  Palliative care following   DVT prophylaxis: SCDs  Antimicrobials: IV Rocephin Fluid: None Consultants: Oncology, GI, palliative care Family Communication: None at bedside  Status: Inpatient Level of care:  Progressive   Patient is from: Long-term placement Needs to continue in-hospital care: Ongoing workup Anticipated d/c to: Hopefully back to long-term placement      Diet:  Diet Order             Diet regular Room service appropriate? Yes; Fluid consistency: Thin; Fluid restriction: 1500 mL Fluid  Diet effective now                   Scheduled Meds:  benztropine  0.5 mg Oral BID   cloZAPine  200 mg Oral QHS   cloZAPine  300 mg Oral Daily   influenza vac split trivalent PF  0.5 mL Intramuscular Tomorrow-1000   levothyroxine  50 mcg Oral Q0600   multivitamin with minerals  1 tablet Oral Daily   pneumococcal 20-valent conjugate vaccine  0.5 mL Intramuscular Tomorrow-1000    PRN meds: albuterol, ondansetron **OR** ondansetron (ZOFRAN) IV, traMADol, traZODone   Infusions:   sodium chloride 50 mL/hr (05/07/23 0507)   azithromycin Stopped (05/06/23 1823)   cefTRIAXone (ROCEPHIN)  IV Stopped (05/06/23 1456)    Antimicrobials: Anti-infectives (From admission, onward)    Start     Dose/Rate Route Frequency Ordered Stop   05/06/23 1500  cefTRIAXone (ROCEPHIN) 1 g in sodium chloride 0.9 % 100 mL IVPB        1 g 200 mL/hr over 30 Minutes Intravenous Every 24 hours 05/05/23 1700 05/10/23 1459   05/05/23 1715  azithromycin (ZITHROMAX) 500 mg in sodium chloride 0.9 % 250  mL IVPB        500 mg 250 mL/hr over 60 Minutes Intravenous Every 24 hours 05/05/23 1700 05/10/23 1714   05/05/23 1430  cefTRIAXone (ROCEPHIN) 2 g in sodium chloride 0.9 % 100 mL IVPB        2 g 200 mL/hr over 30 Minutes Intravenous  Once 05/05/23 1422 05/05/23 1627       Objective: Vitals:   05/07/23 0945 05/07/23 1000  BP: 115/71 113/78  Pulse: (!) 105 (!) 105  Resp: 20 20  Temp: 98.6 F (37 C) 98.5 F (36.9 C)  SpO2: 97% 97%    Intake/Output Summary (Last 24 hours) at 05/07/2023 1054 Last data filed at 05/07/2023 0945 Gross per 24 hour  Intake 2477.04 ml  Output 1050 ml  Net 1427.04 ml   Filed Weights   05/06/23 0239  Weight: 65.5 kg   Weight change:  Body mass index is 21.32  kg/m.   Physical Exam: General exam: Pleasant, elderly African-American male Skin: No rashes, lesions or ulcers.  Icterus present HEENT: Atraumatic, normocephalic, no obvious bleeding Lungs: Diminished air entry in both bases, otherwise clear to auscultation bilaterally CVS: Regular rate and rhythm, no murmur GI/Abd distended, epigastric tenderness, bowel sound present CNS: Alert, awake, oriented to place only Psychiatry: Sad affect Extremities: No pedal edema, no calf tenderness  Data Review: I have personally reviewed the laboratory data and studies available.  F/u labs ordered Unresulted Labs (From admission, onward)     Start     Ordered   05/06/23 0500  CBC with Differential/Platelet  Daily,   R      05/05/23 1806   05/06/23 0500  Comprehensive metabolic panel  Daily,   R      05/05/23 1806   05/05/23 1335  Pathologist smear review  Once,   R        05/05/23 1335            Total time spent in review of labs and imaging, patient evaluation, formulation of plan, documentation and communication with family: 55 minutes  Signed, Lorin Glass, MD [facility] 05/07/2023

## 2023-05-07 NOTE — Progress Notes (Signed)
Patient resides at [facility], no housing insecurity needs at this time.

## 2023-05-08 DIAGNOSIS — A419 Sepsis, unspecified organism: Secondary | ICD-10-CM | POA: Diagnosis not present

## 2023-05-08 DIAGNOSIS — J189 Pneumonia, unspecified organism: Secondary | ICD-10-CM | POA: Diagnosis not present

## 2023-05-08 LAB — CBC WITH DIFFERENTIAL/PLATELET
Abs Immature Granulocytes: 1.38 10*3/uL — ABNORMAL HIGH (ref 0.00–0.07)
Basophils Absolute: 0.1 10*3/uL (ref 0.0–0.1)
Basophils Relative: 1 %
Eosinophils Absolute: 0.1 10*3/uL (ref 0.0–0.5)
Eosinophils Relative: 1 %
HCT: 20 % — ABNORMAL LOW (ref 39.0–52.0)
Hemoglobin: 6.7 g/dL — CL (ref 13.0–17.0)
Immature Granulocytes: 18 %
Lymphocytes Relative: 35 %
Lymphs Abs: 2.7 10*3/uL (ref 0.7–4.0)
MCH: 29.6 pg (ref 26.0–34.0)
MCHC: 33.5 g/dL (ref 30.0–36.0)
MCV: 88.5 fL (ref 80.0–100.0)
Monocytes Absolute: 0.9 10*3/uL (ref 0.1–1.0)
Monocytes Relative: 12 %
Neutro Abs: 2.6 10*3/uL (ref 1.7–7.7)
Neutrophils Relative %: 33 %
Platelets: 15 10*3/uL — CL (ref 150–400)
RBC: 2.26 MIL/uL — ABNORMAL LOW (ref 4.22–5.81)
RDW: 15.3 % (ref 11.5–15.5)
WBC: 7.7 10*3/uL (ref 4.0–10.5)
nRBC: 4.7 % — ABNORMAL HIGH (ref 0.0–0.2)

## 2023-05-08 LAB — BPAM PLATELET PHERESIS
Blood Product Expiration Date: 202409182359
ISSUE DATE / TIME: 202409160935
Unit Type and Rh: 600

## 2023-05-08 LAB — COMPREHENSIVE METABOLIC PANEL
ALT: 195 U/L — ABNORMAL HIGH (ref 0–44)
AST: 364 U/L — ABNORMAL HIGH (ref 15–41)
Albumin: 1.9 g/dL — ABNORMAL LOW (ref 3.5–5.0)
Alkaline Phosphatase: 545 U/L — ABNORMAL HIGH (ref 38–126)
Anion gap: 9 (ref 5–15)
BUN: 15 mg/dL (ref 8–23)
CO2: 23 mmol/L (ref 22–32)
Calcium: 8.2 mg/dL — ABNORMAL LOW (ref 8.9–10.3)
Chloride: 101 mmol/L (ref 98–111)
Creatinine, Ser: <0.3 mg/dL — ABNORMAL LOW (ref 0.61–1.24)
Glucose, Bld: 111 mg/dL — ABNORMAL HIGH (ref 70–99)
Potassium: 3.8 mmol/L (ref 3.5–5.1)
Sodium: 133 mmol/L — ABNORMAL LOW (ref 135–145)
Total Bilirubin: 21 mg/dL (ref 0.3–1.2)
Total Protein: 4.9 g/dL — ABNORMAL LOW (ref 6.5–8.1)

## 2023-05-08 LAB — PREPARE PLATELET PHERESIS: Unit division: 0

## 2023-05-08 LAB — PREPARE RBC (CROSSMATCH)

## 2023-05-08 MED ORDER — SODIUM CHLORIDE 0.9% IV SOLUTION: Freq: Once | INTRAVENOUS | Status: AC

## 2023-05-08 MED ORDER — LORAZEPAM 1 MG PO TABS: 1.0000 mg | ORAL_TABLET | ORAL | Status: DC | PRN

## 2023-05-08 MED ORDER — LORAZEPAM 2 MG/ML PO CONC: 1.0000 mg | ORAL | Status: DC | PRN

## 2023-05-08 MED ORDER — LORAZEPAM 2 MG/ML IJ SOLN: 1.0000 mg | INTRAMUSCULAR | Status: DC | PRN

## 2023-05-08 MED ORDER — MORPHINE SULFATE (CONCENTRATE) 10 MG/0.5ML PO SOLN
5.0000 mg | ORAL | Status: DC | PRN
  Administered 2023-05-08: 5 mg via ORAL
  Filled 2023-05-08: qty 0.5

## 2023-05-08 MED ORDER — MORPHINE SULFATE (PF) 2 MG/ML IV SOLN: 1.0000 mg | INTRAVENOUS | Status: DC | PRN

## 2023-05-08 MED ORDER — ACETAMINOPHEN 325 MG PO TABS
650.0000 mg | ORAL_TABLET | Freq: Four times a day (QID) | ORAL | Status: AC | PRN
  Administered 2023-05-08 (×2): 650 mg via ORAL
  Filled 2023-05-08 (×2): qty 2

## 2023-05-08 MED ORDER — MORPHINE SULFATE (CONCENTRATE) 10 MG/0.5ML PO SOLN: 5.0000 mg | ORAL | Status: DC | PRN

## 2023-05-08 NOTE — Progress Notes (Signed)
This morning, Anthony Solis does seem to be a little bit happier.  He was able to talk to me a little bit.  I am glad to see that he is not having any obvious pain.  It is no surprise that his labs look worse.  His hemoglobin is 6.7.  Platelet count 15,000.  Again, I have to suspect that he has some marrow involvement by his tumor.  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  His bilirubin is 21.  His BUN is 15 creatinine 0.3.  Calcium 8.2 with albumin of 1.9.  Again, he is NOT a candidate for any systemic therapy.  I agree that he is NOT a candidate for any invasive procedures.  Somehow, we really need to establish a CODE STATUS on him.  I doubt that he will be with this in 2 or 3 weeks.  I would hate to have to see him go through intubation/CPR.  This would be an exercise in futility.  Again, everything is all about quality of life.  I want to make sure that he has comfort, respect and dignity.  Again, I really do not believe that his prognosis is going to be past September.  He has 1 unit of blood ordered.  I really would be very conservative with give him transfusions.  I would not give him any platelets.  I know this is an incredibly challenging situation seeing that there is no family member that can be talked to regarding end-of-life issues.  I just do not believe that he understands what is going on with him.  I just do not believe that he is mentally competent to be able to make any decision regarding his care.  I know that the staff on [address] are doing a great job with him.   Christin Bach, MD  2 Corinthians 5:7

## 2023-05-08 NOTE — Progress Notes (Addendum)
Patient is frail and not a candidate for treatment of his newly diagnosed metastatic pancreatic cancer.  Currently he does not have mental competence to advocate for himself.  He does not have any family member or legal guardian either. Patient has been assessed by multiple providers from the services of GI, oncology, palliative care and me from primary team.  We have a common consensus that patient would benefit at this time from comfort measures and hospice care. I will switch his CODE STATUS to DNR/DNI and seek residential hospice placement.  Comfort measures initiated.  Nonessential meds held.

## 2023-05-08 NOTE — Plan of Care (Signed)
Problem: Education: Goal: Knowledge of General Education information will improve Description: Including pain rating scale, medication(s)/side effects and non-pharmacologic comfort measures Outcome: Progressing   Problem: Coping: Goal: Level of anxiety will decrease Outcome: Progressing   Problem: Safety: Goal: Ability to remain free from injury will improve Outcome: Progressing

## 2023-05-08 NOTE — Progress Notes (Signed)
Daily Progress Note   Patient Name: Anthony Solis       Date: 05/08/2023 DOB: March 31, 1956  Age: 67 y.o. MRN#: [id number] Attending Physician: Lorin Glass, MD Primary Care Physician: Default, Provider, MD Admit Date: 05/05/2023  Reason for Consultation/Follow-up: Establishing goals of care  Subjective: Getting packed red blood cell transfusion.  Not awake not alert.  Does not communicate.  Does not have capacity in my opinion.    Length of Stay: 3  Current Medications: Scheduled Meds:   benztropine  0.5 mg Oral BID   cloZAPine  200 mg Oral QHS   cloZAPine  300 mg Oral Daily   influenza vac split trivalent PF  0.5 mL Intramuscular Tomorrow-1000   levothyroxine  50 mcg Oral Q0600   multivitamin with minerals  1 tablet Oral Daily   pneumococcal 20-valent conjugate vaccine  0.5 mL Intramuscular Tomorrow-1000    Continuous Infusions:  sodium chloride 50 mL/hr at 05/08/23 1212   azithromycin Stopped (05/07/23 1757)   cefTRIAXone (ROCEPHIN)  IV Stopped (05/07/23 1529)    PRN Meds: albuterol, ondansetron **OR** ondansetron (ZOFRAN) IV, traMADol, traZODone  Physical Exam         Early African-American gentleman resting in bed in Diminished breath sounds bases Attempts to awaken but does not respond Appears with generalized weakness and ongoing deconditioning  Vital Signs: BP 94/71   Pulse (!) 108   Temp (!) 97.5 F (36.4 C) (Axillary) Comment (Src): pt had been taking oral fluids  Resp 18   Ht 5\' 9"  (1.753 m)   Wt 65.5 kg   SpO2 100%   BMI 21.32 kg/m  SpO2: SpO2: 100 % O2 Device: O2 Device: Nasal Cannula O2 Flow Rate: O2 Flow Rate (L/min): 4 L/min  Intake/output summary:  Intake/Output Summary (Last 24 hours) at 05/08/2023 1218 Last data filed at 05/08/2023  1200 Gross per 24 hour  Intake 2426.74 ml  Output 1100 ml  Net 1326.74 ml   LBM: Last BM Date : 05/06/23 Baseline Weight: Weight: 65.5 kg Most recent weight: Weight: 65.5 kg       Palliative Assessment/Data:      Patient Active Problem List   Diagnosis Date Noted   Palliative care encounter 05/07/2023   Pancreatic mass 05/07/2023   Complex care coordination 05/07/2023   Elevated LFTs 05/07/2023   Sepsis due  to pneumonia (HCC) 05/05/2023   Hallucinations 08/22/2022   Hypokalemia 03/13/2019   HTN (hypertension) 03/13/2019   Elevated CK 03/13/2019   Elevated liver enzymes 03/13/2019   Hyperbilirubinemia    Hypothyroidism 12/03/2016   Schizophrenia (HCC) 12/03/2016   Syncope 12/03/2016   Hyponatremia 12/03/2016   Orthostatic hypotension 12/03/2016   Chronic hepatitis C without hepatic coma (HCC) 2018    Palliative Care Assessment & Plan   Patient Profile:    Assessment: 67 year old gentleman from group home, hypertension dyslipidemia schizophrenia polysubstance user, history of several psychiatric hospitalizations in the past, long-term resident at a [facility] since 1998, no available next of kin, GERD hypothyroidism irritable bowel syndrome. 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  Additionally also with multilobar pneumonia/aspiration pneumonitis. Patient admitted to hospital medicine service, being followed by GI as well as oncology   Recommendations/Plan: Call placed and discussed with the administrator Clydie Braun at patient's facility [facility].  She endorses that the patient has been a resident there since 1998, does not have any previously completed advanced directives, has no next of kin.  I tried calling uncle [name] at [phone] without success.  TOC  note reviewed.  Dr Gustavo Lah note reviewed I discussed with [facility] MD.  Recommendation is for  establishment of DO NOT RESUSCITATE/comfort measures. 2.Recommendation is for considering residential hospice and if this is not possible then considering discharge back to [facility] with hospice.  Administrator Clydie Braun at [place] center told me that their facility contracts with [employer]. 3.  Continue full scope of comfort measures DNR/DNI.  Medical procedures or interventions such as CPR will be futile and more harmful in my opinion.    Code Status:    Code Status Orders  (From admission, onward)           Start     Ordered   05/05/23 1704  Full code  Continuous       Question:  By:  Answer:  Default: patient does not have capacity for decision making, no surrogate or prior directive available   05/05/23 1703           Code Status History     Date Active Date Inactive Code Status Order ID Comments User Context   02/13/2020 1635 02/18/2020 1623 Full Code [id number]  Thermon Leyland, NP Inpatient   02/08/2020 0813 02/13/2020 1628 Full Code [id number]  Arthor Captain, PA-C ED   03/11/2019 2148 03/14/2019 1656 Full Code [id number]  Kathlen Mody, MD Inpatient   12/03/2016 1313 12/05/2016 1954 Full Code [id number]  Alba Cory, MD Inpatient       Prognosis:  < 2 weeks  Discharge Planning: Hospice facility  Care plan was discussed with 1.  Administrator Clydie Braun at [employer] adult [employer]. 2.  Dr. Pola Corn [facility] MD  Thank you for allowing the Palliative Medicine Team to assist in the care of this patient. High MDM     Greater than 50%  of this time was spent counseling and coordinating care related to the above assessment and plan.  Rosalin Hawking, MD  Please contact Palliative Medicine Team phone at [phone] for questions and concerns.

## 2023-05-08 NOTE — Plan of Care (Signed)
  Problem: Education: Goal: Knowledge of General Education information will improve Description Including pain rating scale, medication(s)/side effects and non-pharmacologic comfort measures Outcome: Progressing   

## 2023-05-08 NOTE — Progress Notes (Signed)
[facility])  Referral initially received for patient to be considered for [facility] transfer. However, TOC/Edith contacted patients prior facility and they are requesting patient return to the facility w/ [facility] to follow.   [facility] to sign off but are available if needs arise.  Please call with any questions/concerns.    Thank you for the opportunity to participate in this patient's care.   Eugenie Birks, MSW [facility] Hospital Liaison  [phone]

## 2023-05-08 NOTE — Progress Notes (Signed)
PROGRESS NOTE  Anthony Solis  DOB: [birth year]  PCP: Default, Provider, MD ZOX:[id number]  DOA: 05/05/2023  LOS: 3 days  Hospital Day: 4  Brief narrative: Anthony Solis is a 67 y.o. male with PMH significant for HTN, HLD, schizophrenia, polysubstance abuse, GERD, hypothyroidism, IBS who is a long-term resident at [facility] since 1998. 9/14, patient was brought to the ED for evaluation of altered mental status, generalized weakness  In the ED, patient was alert to self only. He was noted to have significant icterus of eyes and skin. Labs showed leukocytosis, elevated LFTs, total bilirubin elevated to 19.5 CT scan abdomen/pelvis showed A 4.8 cm solid pancreatic head mass with vascular encasement of the superior mesenteric artery and abutment of the SMV and IVC.  The mass substantially abuts the transverse duodenum as well.  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   Severe intrahepatic and extrahepatic biliary dilatation secondary to the mass Possible metastasis to spleen, left adrenal gland Possible multilobar pneumonia/aspiration pneumonitis on right lung lobes Prominent stool throughout the colon  Admitted to [facility] GI Dr. Dulce Sellar and oncology Dr. Myna Hidalgo were consulted  Subjective: Patient was seen and examined this morning.   Propped up in bed.  Not in distress.  On low-flow oxygen.  Getting 1 unit of PRBC transfusion this morning for low hemoglobin. Oncology follow-up from this morning appreciated.  Assessment and plan: Suspected pancreatic adenocarcinoma with metastasis Obstructive jaundice Sent for altered mental status, weakness, lethargy Labs and CT scan findings as above showing possible pancreatic adenocarcinoma with spleen and adrenal metastasis Significantly elevated LFTs and bilirubin Seen by GI oncology.   Per oncology Dr. Gustavo Lah note, patient is not a candidate for any systemic therapy.  Furthermore, he seems to have inadequate mental competence  to make any decision about his care. palliative care consultation obtained.  Patient unfortunately do not have any family member or legal guardian to make a decision.  Remains full code.  Challenging situation. Recent Labs  Lab 05/05/23 1335 05/05/23 1653 05/06/23 0514 05/07/23 0509 05/08/23 0515  AST 273*  --  247* 283* 364*  ALT 184*  --  158* 173* 195*  ALKPHOS 690*  --  557* 560* 545*  BILITOT 19.5*  --  16.8* 18.6* 21.0*  PROT 6.9  --  5.4* 5.2* 4.9*  ALBUMIN 2.8*  --  2.4* 2.1* 1.9*  AMMONIA 42*  --   --   --   --   PLT 19* 15* 40* 14* 15*   Thrombocytopenia Platelet level significantly low this to units of platelet transfusion so far.  Acute on chronic anemia No active bleeding.  Hemoglobin lower than baseline at 8.3 this morning.  Not sure why blood transfusion was ordered this morning.  I will avoid further transfusion at this time. Recent Labs    05/05/23 1335 05/05/23 1653 05/06/23 0514 05/06/23 1633 05/07/23 0509 05/08/23 0515  HGB 11.6* 9.1* 9.2*  --  8.3* 6.7*  MCV 88.1 88.4 91.7  --  91.5 88.5  RETICCTPCT  --   --   --  0.4  --   --    Sepsis POA Multifocal pneumonia Aspiration pneumonia Met sepsis criteria with tachycardia, leukocytosis, lactic acidosis and right sided infiltrates which could be due to aspiration pneumonia  Continue Rocephin and IV azithromycin started empirically.   Pending blood culture report WBC count and lactic level improved. Recent Labs  Lab 05/05/23 1335 05/05/23 1345 05/05/23 1645 05/05/23 1653 05/06/23 0514 05/06/23 1354 05/06/23 1633 05/07/23 0509 05/08/23 0515  WBC 14.9*  --   --  12.2* 11.4*  --   --  9.0 7.7  LATICACIDVEN  --  3.2* 3.1*  --   --  1.5 1.7  --   --   PROCALCITON  --   --   --   --   --   --   --  0.63  --     Acute hypoxic respiratory failure secondary to above Currently on 4 L with O2 saturation of 99 to 100%.  Wean down as tolerated   Hyponatremia Possibly related to low oral intake.   Gradually improving IV fluid. Recent Labs  Lab 05/05/23 1335 05/06/23 0514 05/07/23 0509 05/08/23 0515  NA 125* 131* 131* 133*   Acute metabolic encephalopathy  H/o schizophrenia Acute alteration mental status likely due to acute medical issues.  Notably, patient had multiple hospital admissions in the past, and noted aggressive behavior PTA meds- Haldol, Remeron, Seroquel, benztropine, clozapine Currently continued on clozapine and benztropine.  Other meds on hold currently because of altered mentation Fall precautions  Hypertension PTA meds- amlodipine.   Currently on hold.  Monitor blood pressure   Hypothyroidism Continue    Mobility: PT eval  Goals of care   Code Status: Full Code  Palliative care following   DVT prophylaxis: SCDs Place and maintain sequential compression device Start: 05/07/23 1430 Antimicrobials: IV Rocephin Fluid: None Consultants: Oncology, GI, palliative care Family Communication: None at bedside  Status: Inpatient Level of care:  Progressive   Patient is from: Long-term placement Needs to continue in-hospital care: Challenging situation as there is no family member legal guardian to medicine for him.  Patient does not have adequate mental capacity at this time    Diet:  Diet Order             Diet regular Room service appropriate? Yes; Fluid consistency: Thin; Fluid restriction: 1500 mL Fluid  Diet effective now                   Scheduled Meds:  benztropine  0.5 mg Oral BID   cloZAPine  200 mg Oral QHS   cloZAPine  300 mg Oral Daily   influenza vac split trivalent PF  0.5 mL Intramuscular Tomorrow-1000   levothyroxine  50 mcg Oral Q0600   multivitamin with minerals  1 tablet Oral Daily   pneumococcal 20-valent conjugate vaccine  0.5 mL Intramuscular Tomorrow-1000    PRN meds: albuterol, ondansetron **OR** ondansetron (ZOFRAN) IV, traMADol, traZODone   Infusions:   sodium chloride Stopped (05/08/23 0858)    azithromycin Stopped (05/07/23 1757)   cefTRIAXone (ROCEPHIN)  IV Stopped (05/07/23 1529)    Antimicrobials: Anti-infectives (From admission, onward)    Start     Dose/Rate Route Frequency Ordered Stop   05/06/23 1500  cefTRIAXone (ROCEPHIN) 1 g in sodium chloride 0.9 % 100 mL IVPB        1 g 200 mL/hr over 30 Minutes Intravenous Every 24 hours 05/05/23 1700 05/10/23 1459   05/05/23 1715  azithromycin (ZITHROMAX) 500 mg in sodium chloride 0.9 % 250 mL IVPB        500 mg 250 mL/hr over 60 Minutes Intravenous Every 24 hours 05/05/23 1700 05/10/23 1714   05/05/23 1430  cefTRIAXone (ROCEPHIN) 2 g in sodium chloride 0.9 % 100 mL IVPB        2 g 200 mL/hr over 30 Minutes Intravenous  Once 05/05/23 1422 05/05/23 1627       Objective: Vitals:  05/08/23 0845 05/08/23 0920  BP: 96/70 113/74  Pulse: (!) 102 (!) 102  Resp: 20 20  Temp: 98.6 F (37 C) (!) 100.5 F (38.1 C)  SpO2: 97% 97%    Intake/Output Summary (Last 24 hours) at 05/08/2023 1023 Last data filed at 05/08/2023 1007 Gross per 24 hour  Intake 2157.61 ml  Output 1100 ml  Net 1057.61 ml   Filed Weights   05/06/23 0239  Weight: 65.5 kg   Weight change:  Body mass index is 21.32 kg/m.   Physical Exam: General exam: Pleasant, elderly African-American male.  Not in pain Skin: No rashes, lesions or ulcers.  Icterus present HEENT: Atraumatic, normocephalic, no obvious bleeding Lungs: Diminished air entry in both bases, otherwise clear to auscultation bilaterally CVS: Regular rate and rhythm, no murmur GI/Abd distended, epigastric tenderness, abdomen, bowel sound present CNS: Alert, awake, oriented to place only Psychiatry: Sad affect Extremities: No pedal edema, no calf tenderness  Data Review: I have personally reviewed the laboratory data and studies available.  F/u labs ordered Unresulted Labs (From admission, onward)     Start     Ordered   05/06/23 0500  CBC with Differential/Platelet  Daily,   R       05/05/23 1806   05/06/23 0500  Comprehensive metabolic panel  Daily,   R      05/05/23 1806            Total time spent in review of labs and imaging, patient evaluation, formulation of plan, documentation and communication with family: 45 minutes  Signed, Lorin Glass, MD [facility] 05/08/2023

## 2023-05-08 NOTE — Progress Notes (Signed)
Mobility Specialist - Progress Note    05/08/23 1341  Mobility  Activity Transferred from bed to chair  Level of Assistance Moderate assist, patient does 50-74%  Assistive Device Front wheel walker  Range of Motion/Exercises Active Assistive  Activity Response Tolerated fair  $Mobility charge 1 Mobility  Mobility Specialist Start Time (ACUTE ONLY) 1320  Mobility Specialist Stop Time (ACUTE ONLY) 1341  Mobility Specialist Time Calculation (min) (ACUTE ONLY) 21 min   Pt was found in bed and agreeable to transfer to recliner chair. Pt was mod-A with bed mobility and CG for transfer. Was left on recliner chair with all needs met. Call bell in reach and chair alarm on.  Billey Chang Mobility Specialist

## 2023-05-08 NOTE — Progress Notes (Signed)
SATURATION QUALIFICATIONS: (This note is used to comply with regulatory documentation for home oxygen)  Patient Saturations on Room Air at Rest = 92%  Patient Saturations on Room Air while Ambulating = 93%  No oxygen required while ambulating.   Lulubelle Simcoe, Yancey Flemings, RN

## 2023-05-08 NOTE — TOC Progression Note (Signed)
Transition of Care [facility]) - Progression Note    Patient Details  Name: Anthony Solis MRN: [id number] Date of Birth: 02-Jul-1956  Transition of Care [facility]) CM/SW Contact  Howell Rucks, RN Phone Number: 05/08/2023, 1:40 PM  Clinical Narrative:  [facility] consulted for residential hospice, per teams chat  established DNR DNI and comfort measures. plan is for residential hospice vs return to [facility] with home hospice through [facility](facility contract). NCM outreached to Clydie Braun, Public relations account executive, provided name of facility Librarian, academic, Trenton Gammon, who Clydie Braun reports is willing to sign paperwork needed. NCM outreached to [employer] at [phone], introduced self and role of TOC/NCM, Ms Sherron Monday confirmed she will sign paper needed for hospice care, requested patient return to [facility] for home hospice as they have cared for patient many years, team notified   -1:45pm Call to Lupita Leash with [facility] to initiate referral, confirms she has access to [facility] to review medical records. Lupita Leash provided name  and contact number of Executive Director of [facility], [place], [place] [phone] to coordinate paperwork. TOC will continue to follow.           Expected Discharge Plan and Services                                               Social Determinants of Health (SDOH) Interventions SDOH Screenings   Food Insecurity: No Food Insecurity (05/06/2023)  Housing: Medium Risk (05/06/2023)  Transportation Needs: No Transportation Needs (05/06/2023)  Utilities: Not At Risk (05/06/2023)  Depression (PHQ2-9): Low Risk  (01/24/2021)  Tobacco Use: High Risk (05/06/2023)    Readmission Risk Interventions    05/07/2023   10:01 AM  Readmission Risk Prevention Plan  Transportation Screening Complete  PCP or Specialist Appt within 5-7 Days Complete  Home Care Screening Complete  Medication Review (RN CM) Complete

## 2023-05-08 NOTE — Progress Notes (Signed)
PT Cancellation Note  Patient Details Name: Arvell Maltez MRN: [id number] DOB: 05-22-1956   Cancelled Treatment:    Reason Eval/Treat Not Completed: PT screened, no needs identified, will sign off Pt now comfort care with transition to hospice.  No further PT needs.  Anise Salvo, PT Acute Rehab [facility] [phone]   Rayetta Humphrey 05/08/2023, 12:41 PM

## 2023-05-08 NOTE — Progress Notes (Signed)
   05/08/23 0645  Notify: Charge Nurse/RN  Name of Charge Nurse/RN Notified Earley Abide, RN  Provider Notification  Provider Name/Title Ulla Gallo, NP  Date Provider Notified 05/08/23  Time Provider Notified [phone]  Method of Notification Page [facility])  Notification Reason Critical Result (Hgb 6.7, Biliruben 21.0)  Provider response See new orders  Date of Provider Response 05/08/23  Time of Provider Response [phone]

## 2023-05-09 DIAGNOSIS — A419 Sepsis, unspecified organism: Secondary | ICD-10-CM | POA: Diagnosis not present

## 2023-05-09 DIAGNOSIS — J189 Pneumonia, unspecified organism: Secondary | ICD-10-CM | POA: Diagnosis not present

## 2023-05-09 LAB — BPAM RBC
Blood Product Expiration Date: 202410152359
ISSUE DATE / TIME: 202409170857
Unit Type and Rh: 6200

## 2023-05-09 LAB — TYPE AND SCREEN
ABO/RH(D): A POS
Antibody Screen: NEGATIVE
Unit division: 0

## 2023-05-09 MED ORDER — LORAZEPAM 2 MG/ML PO CONC: 1.0000 mg | ORAL | 0 refills | Status: AC | PRN

## 2023-05-09 MED ORDER — MORPHINE SULFATE (CONCENTRATE) 10 MG/0.5ML PO SOLN: 5.0000 mg | ORAL | 0 refills | Status: AC | PRN

## 2023-05-09 NOTE — [place] FL2 (Signed)
[place] MEDICAID FL2 LEVEL OF CARE FORM     IDENTIFICATION  Patient Name: Anthony Solis Birthdate: 04/06/1956 Sex: male Admission Date (Current Location): 05/05/2023  [facility] and [place] Number:  Producer, television/film/video and Address:  [facility],  [address] [place], [place] [address]      Provider Number: [id number]  Attending Physician Name and Address:  Lorin Glass, MD  Relative Name and Phone Number:  Clydie Braun (facility administrator at [facility]) phone: [phone]    Current Level of Care: Hospital Recommended Level of Care: [facility] Prior Approval Number:    Date Approved/Denied:   PASRR Number:    Discharge Plan: Home    Current Diagnoses: Patient Active Problem List   Diagnosis Date Noted   Palliative care encounter 05/07/2023   Pancreatic mass 05/07/2023   Complex care coordination 05/07/2023   Elevated LFTs 05/07/2023   Sepsis due to pneumonia (HCC) 05/05/2023   Hallucinations 08/22/2022   Hypokalemia 03/13/2019   HTN (hypertension) 03/13/2019   Elevated CK 03/13/2019   Elevated liver enzymes 03/13/2019   Hyperbilirubinemia    Hypothyroidism 12/03/2016   Schizophrenia (HCC) 12/03/2016   Syncope 12/03/2016   Hyponatremia 12/03/2016   Orthostatic hypotension 12/03/2016   Chronic hepatitis C without hepatic coma (HCC) 2018    Orientation RESPIRATION BLADDER Height & Weight     Self, Place  O2 Incontinent, External catheter Weight: 65.5 kg Height:  5\' 9"  (175.3 cm)  BEHAVIORAL SYMPTOMS/MOOD NEUROLOGICAL BOWEL NUTRITION STATUS      Incontinent Diet (regular)  AMBULATORY STATUS COMMUNICATION OF NEEDS Skin   Extensive Assist Verbally Normal                       Personal Care Assistance Level of Assistance  Bathing, Feeding, Dressing Bathing Assistance: Maximum assistance Feeding assistance: Maximum assistance Dressing Assistance: Maximum assistance     Functional Limitations Info  Sight,  Hearing, Speech Sight Info: Adequate Hearing Info: Adequate Speech Info: Adequate    SPECIAL CARE FACTORS FREQUENCY                       Contractures Contractures Info: Not present    Additional Factors Info  Code Status, Allergies, Psychotropic Code Status Info: DNR-Comfort Allergies Info: No Known Allergies Psychotropic Info: see MAR         Current Medications (05/09/2023):  This is the current hospital active medication list Current Facility-Administered Medications  Medication Dose Route Frequency Provider Last Rate Last Admin   albuterol (PROVENTIL) (2.5 MG/3ML) 0.083% nebulizer solution 2.5 mg  2.5 mg Nebulization Q2H PRN Kirby Crigler, Mir M, MD       LORazepam (ATIVAN) tablet 1 mg  1 mg Oral Q4H PRN Dahal, Melina Schools, MD       Or   LORazepam (ATIVAN) 2 MG/ML concentrated solution 1 mg  1 mg Sublingual Q4H PRN Dahal, Binaya, MD       Or   LORazepam (ATIVAN) injection 1 mg  1 mg Intravenous Q4H PRN Dahal, Binaya, MD       morphine (PF) 2 MG/ML injection 1 mg  1 mg Intravenous Q2H PRN Dahal, Binaya, MD       morphine CONCENTRATE 10 MG/0.5ML oral solution 5 mg  5 mg Oral Q2H PRN Dahal, Melina Schools, MD   5 mg at 05/08/23 2145   Or   morphine CONCENTRATE 10 MG/0.5ML oral solution 5 mg  5 mg Sublingual Q2H PRN Lorin Glass, MD  ondansetron (ZOFRAN) tablet 4 mg  4 mg Oral Q6H PRN Kirby Crigler, Mir M, MD       Or   ondansetron [facility]) injection 4 mg  4 mg Intravenous Q6H PRN Maryln Gottron, MD         Discharge Medications: Please see discharge summary for a list of discharge medications.  Relevant Imaging Results:  Relevant Lab Results:   Additional Information SSN: [id number]  Howell Rucks, RN

## 2023-05-09 NOTE — Progress Notes (Signed)
Report called to Anthony Solis @ [facility]. Meaghen Vecchiarelli, Yancey Flemings, RN

## 2023-05-09 NOTE — Progress Notes (Signed)
There are no labs back yet today.  However, he certainly does not appear to be as alert.  I went into see him.  He opened his eyes and that was about it.  This can certainly be part of the decline with respect to his liver and the elevated bilirubin.  He is now a DO NOT RESUSCITATE.  I totally agree with this.  I had to believe that his prognosis is going to be no more than 2-3 weeks.  I saw that he may go back to his group home and have Hospice see him there.  Again, I do not see a problem with this.  I would have to think that he will continue to decline quickly.  I suspect he probably would not eat much.  I believe that he will mostly this sleep.  He does not look like he has any pain.    His vital signs are temperature 97.5.  Pulse 108.  Blood pressure 94/71.  His head and neck exam does show the scleral icterus.  His oral mucosa is dry.  Lungs are clear.  Cardiac exam tachycardic but regular.  Abdomen is soft.  Bowel sounds are slightly decreased.  There is no obvious fluid wave.  Extremities shows some muscle atrophy in upper and lower extremities.  We are dealing with a terminal condition.  Again I want to make sure that Anthony Solis has comfort, respect and dignity.  Hopefully, he will be able to go back to the group home and have Hospice see him there.  Again I do not expect that he will make it to the end of this month.   Christin Bach, MD  2 Timothy 4:6-8

## 2023-05-09 NOTE — TOC Transition Note (Signed)
Transition of Care [facility]) - CM/SW Discharge Note   Patient Details  Name: Anthony Solis MRN: [id number] Date of Birth: 1955/12/31  [facility] CM/SW Contact:  Howell Rucks, RN Phone Number: 05/09/2023, 1:29 PM   Clinical Narrative:  DC to [facility] with Home Hospice through [facility]. PTAR call from transport. No further TOC needs identified.     Final next level of care: Home w Hospice Care Barriers to Discharge: Barriers Resolved   Patient Goals and CMS Choice CMS Medicare.gov Compare Post Acute Care list provided to:: Patient Represenative (must comment) Clydie Braun (facility [phone])    Discharge Placement                  Patient to be transferred to facility by: PTAR Name of family member notified: Clydie Braun (facility coordinator) Patient and family notified of of transfer: 05/09/23  Discharge Plan and Services Additional resources added to the After Visit Summary for                                       Social Determinants of Health (SDOH) Interventions SDOH Screenings   Food Insecurity: No Food Insecurity (05/06/2023)  Housing: Medium Risk (05/06/2023)  Transportation Needs: No Transportation Needs (05/06/2023)  Utilities: Not At Risk (05/06/2023)  Depression (PHQ2-9): Low Risk  (01/24/2021)  Tobacco Use: High Risk (05/06/2023)     Readmission Risk Interventions    05/09/2023    1:27 PM 05/07/2023   10:01 AM  Readmission Risk Prevention Plan  Post Dischage Appt Complete   Medication Screening Complete   Transportation Screening Complete Complete  PCP or Specialist Appt within 5-7 Days  Complete  Home Care Screening  Complete  Medication Review (RN CM)  Complete

## 2023-05-09 NOTE — Progress Notes (Signed)
Daily Progress Note   Patient Name: Majed Colgrove       Date: 05/09/2023 DOB: 1956-01-26  Age: 67 y.o. MRN#: [id number] Attending Physician: Lorin Glass, MD Primary Care Physician: Default, Provider, MD Admit Date: 05/05/2023  Reason for Consultation/Follow-up: Establishing goals of care  Subjective: Awakens when his name is called, says, "I go by [facility]" Confused, gets suspicious and mildly agitated when asked about how he is feeling and about [facility] where he resides.    Does not have capacity in my opinion.    Length of Stay: 4  Current Medications: Scheduled Meds:     Continuous Infusions:    PRN Meds: albuterol, LORazepam **OR** LORazepam **OR** LORazepam, morphine injection, morphine CONCENTRATE **OR** morphine CONCENTRATE, ondansetron **OR** ondansetron (ZOFRAN) IV  Physical Exam         Early African-American gentleman resting in bed   Awake alert, but not oriented to place or time.  Appears with scleral icterus Appears with generalized weakness and ongoing deconditioning  Vital Signs: BP 105/69 (BP Location: Left Arm)   Pulse (!) 109   Temp 99.2 F (37.3 C) (Oral)   Resp 18   Ht 5\' 9"  (1.753 m)   Wt 65.5 kg   SpO2 93%   BMI 21.32 kg/m  SpO2: SpO2: 93 % O2 Device: O2 Device: Nasal Cannula O2 Flow Rate: O2 Flow Rate (L/min): 2 L/min  Intake/output summary:  Intake/Output Summary (Last 24 hours) at 05/09/2023 0958 Last data filed at 05/09/2023 0635 Gross per 24 hour  Intake 906.87 ml  Output 1325 ml  Net -418.13 ml   LBM: Last BM Date : 05/08/23 Baseline Weight: Weight: 65.5 kg Most recent weight: Weight: 65.5 kg       Palliative Assessment/Data:      Patient Active Problem List   Diagnosis Date Noted   Palliative care  encounter 05/07/2023   Pancreatic mass 05/07/2023   Complex care coordination 05/07/2023   Elevated LFTs 05/07/2023   Sepsis due to pneumonia (HCC) 05/05/2023   Hallucinations 08/22/2022   Hypokalemia 03/13/2019   HTN (hypertension) 03/13/2019   Elevated CK 03/13/2019   Elevated liver enzymes 03/13/2019   Hyperbilirubinemia    Hypothyroidism 12/03/2016   Schizophrenia (HCC) 12/03/2016   Syncope 12/03/2016   Hyponatremia 12/03/2016   Orthostatic hypotension 12/03/2016   Chronic  hepatitis C without hepatic coma (HCC) 2018    Palliative Care Assessment & Plan   Patient Profile:    Assessment: 67 year old gentleman from group home, hypertension dyslipidemia schizophrenia polysubstance user, history of several psychiatric hospitalizations in the past, long-term resident at a [facility] since 1998, no available next of kin, GERD hypothyroidism irritable bowel syndrome. 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  Additionally also with multilobar pneumonia/aspiration pneumonitis. Patient admitted to hospital medicine service, being followed by GI as well as oncology   Recommendations/Plan: DNR DNI Comfort care Transfer back to his facility [facility] where the patient has been since 1998, with the addition of Hospice services.  Prognosis few weeks in my opinion.       Code Status: now DNR DNI    Code Status Orders  (From admission, onward)           Start     Ordered   05/05/23 1704  Full code  Continuous       Question:  By:  Answer:  Default: patient does not have capacity for decision making, no surrogate or prior directive available   05/05/23 1703           Code Status History     Date Active Date Inactive Code Status Order ID Comments User Context    02/13/2020 1635 02/18/2020 1623 Full Code [id number]  Thermon Leyland, NP Inpatient   02/08/2020 0813 02/13/2020 1628 Full Code [id number]  Arthor Captain, PA-C ED   03/11/2019 2148 03/14/2019 1656 Full Code [id number]  Kathlen Mody, MD Inpatient   12/03/2016 1313 12/05/2016 1954 Full Code [id number]  Alba Cory, MD Inpatient       Prognosis:  < 2 weeks  Discharge Planning: Back to facility with hospice.   Care plan was discussed with patient who is not decisional and also IDT.  Thank you for allowing the Palliative Medicine Team to assist in the care of this patient. low MDM     Greater than 50%  of this time was spent counseling and coordinating care related to the above assessment and plan.  Rosalin Hawking, MD  Please contact Palliative Medicine Team phone at [phone] for questions and concerns.

## 2023-05-09 NOTE — Plan of Care (Signed)
Problem: Education: Goal: Knowledge of General Education information will improve Description: Including pain rating scale, medication(s)/side effects and non-pharmacologic comfort measures Outcome: Completed/Met   Problem: Activity: Goal: Risk for activity intolerance will decrease Outcome: Progressing

## 2023-05-09 NOTE — Discharge Summary (Signed)
Physician Discharge Summary  Anthony Solis LOV:[id number] DOB: [birth year] DOA: 05/05/2023  PCP: Default, Provider, MD  Admit date: 05/05/2023 Discharge date: 05/09/2023  Admitted From: [facility] Discharge disposition: Back to nursing facility with hospice  Brief narrative: Anthony Solis is a 67 y.o. male with PMH significant for HTN, HLD, schizophrenia, polysubstance abuse, GERD, hypothyroidism, IBS who is a long-term resident at [facility] since 1998. 9/14, patient was brought to the ED for evaluation of altered mental status, generalized weakness  In the ED, patient was alert to self only. He was noted to have significant icterus of eyes and skin. Labs showed leukocytosis, elevated LFTs, total bilirubin elevated to 19.5 CT scan abdomen/pelvis showed A 4.8 cm solid pancreatic head mass with vascular encasement of the superior mesenteric artery and abutment of the SMV and IVC.  The mass substantially abuts the transverse duodenum as well.  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   Per oncology Dr. Gustavo Lah note, patient is not a candidate for any systemic therapy.    Currently he does not have mental competence to advocate for himself.  He does not have any family member or legal guardian either. Patient has been assessed by multiple providers from the services of GI, oncology, palliative care and me from primary team. We have a common consensus that patient would benefit at this time from comfort measures and hospice care. His CODE  STATUS was switched to DNR/DNI.  Hospice services at the facility have been arranged.  Subjective: Patient was seen and examined this morning.   Sleepy.  On low-flow oxygen.  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 and Obstructive jaundice Not a candidate for cancer treatment  Comfort care measures initiated  Discharge with hospice service today  Other acute issues not currently addressed Thrombocytopenia Acute on chronic anemia Sepsis POA Multifocal pneumonia Aspiration pneumonia Acute hypoxic respiratory failure secondary to above Hyponatremia Acute metabolic encephalopathy  H/o schizophrenia Hypertension Hypothyroidism  Goals of care   Code Status: Do not attempt resuscitation (DNR) - Comfort care   Wounds: -    Discharge Exam:   Vitals:   05/08/23 1117 05/08/23 1204 05/08/23 1615 05/09/23 0631  BP: 109/70 94/71  105/69  Pulse: (!) 106 (!) 108  (!) 109  Resp:  18  18  Temp: 99.2 F (37.3 C) (!) 97.5 F (36.4 C)  99.2 F (37.3 C)  TempSrc: Oral Axillary  Oral  SpO2: 98% 100% 92% 93%  Weight:      Height:        Body mass index is 21.32 kg/m.  General exam: Lethargic.  Low-flow oxygen.  Comfortable. I did not do a detailed examination because of comfort care status.  Follow ups:    Follow-up Information     [facility] Follow up.   Contact information: [address] [place] [address] [phone]                Discharge Instructions:   Discharge Instructions     Activity as tolerated - No  restrictions   Complete by: As directed    Call MD for:   Complete by: As directed    Please get in touch with hospice MD/nurse for any symptom control.   Diet general   Complete by: As directed    If patient is alert and awake enough to eat, can allow luxury feeding.   Discharge  instructions   Complete by: As directed    Medicines intended for comfort and pain management prescribed. Rest of the care per hospice policy.       Discharge Medications:   Allergies as of 05/09/2023   No Known Allergies      Medication List     STOP taking these medications    amLODipine 2.5 MG tablet Commonly known as: NORVASC   benztropine 0.5 MG tablet Commonly known as: COGENTIN   cloZAPine 100 MG tablet Commonly known as: CLOZARIL   dicyclomine 20 MG tablet Commonly known as: BENTYL   docusate sodium 100 MG capsule Commonly known as: COLACE   fenofibrate 48 MG tablet Commonly known as: TRICOR   haloperidol 2 MG/ML solution Commonly known as: HALDOL   levothyroxine 50 MCG tablet Commonly known as: SYNTHROID   mirtazapine 30 MG tablet Commonly known as: REMERON   multivitamin tablet   NONFORMULARY OR COMPOUNDED ITEM   QUEtiapine 200 MG tablet Commonly known as: SEROQUEL   QUEtiapine 300 MG tablet Commonly known as: SEROQUEL   senna 8.6 MG tablet Commonly known as: SENOKOT   simvastatin 10 MG tablet Commonly known as: ZOCOR       TAKE these medications    LORazepam 2 MG/ML concentrated solution Commonly known as: ATIVAN Place 0.5 mLs (1 mg total) under the tongue every 4 (four) hours as needed for up to 10 days for anxiety.   morphine CONCENTRATE 10 MG/0.5ML Soln concentrated solution Take 0.25 mLs (5 mg total) by mouth every 2 (two) hours as needed for up to 10 days for moderate pain (or dyspnea).               Durable Medical Equipment  (From admission, onward)           Start     Ordered   05/08/23 1339  For home use only DME oxygen  Once       Question Answer Comment  Length of Need Lifetime   Mode or (Route) Nasal cannula   Liters per Minute 2   Frequency Continuous (stationary and portable oxygen unit needed)   Oxygen conserving device Yes   Oxygen delivery system Gas      05/08/23 1338   05/08/23 1339  For  home use only DME Hospital bed  Once       Question Answer Comment  Length of Need Lifetime   Patient has (list medical condition): Hospice care   Bed type Semi-electric   Support Surface: Gel Overlay      05/08/23 1338   05/08/23 1339  For home use only DME Suction  Once       Question:  Suction  Answer:  Oral   05/08/23 1338             The results of significant diagnostics from this hospitalization (including imaging, microbiology, ancillary and laboratory) are listed below for reference.    Procedures and Diagnostic Studies:   US Abdomen Limited RUQ (LIVER/GB)  Result Date: 05/05/2023 CLINICAL DATA:  [id number] Transaminitis [id number] EXAM: ULTRASOUND ABDOMEN LIMITED COMPARISON:  12/04/2016 FINDINGS: Intrahepatic and extrahepatic biliary ductal  dilatation is identified. CBD 1 cm. No hepatic parenchymal lesions are seen. Gallbladder demonstrates echogenic sludge and hypervascularity which could indicate a mucosal lesion. The pancreatic mass observed on CT from earlier the same day was not evaluated sonographically. No gallbladder wall thickening or pericholecystic fluid is identified. Hepatopetal portal vein flow. IMPRESSION: 1. Hypervascular process in the gallbladder concerning for a potential neoplastic etiology. 2. Gallbladder appears mildly distended with evidence of echogenic sludge. 3. Intra- and extrahepatic biliary ductal dilatation. Electronically Signed   By: Layla Maw M.D.   On: 05/05/2023 16:24   CT CHEST ABDOMEN PELVIS W CONTRAST  Result Date: 05/05/2023 CLINICAL DATA:  Airspace opacity suspicious for pneumonia. Productive cough. Weakness. Disorientation. Pancreatic mass. * Tracking Code: BO * EXAM: CT CHEST, ABDOMEN, AND PELVIS WITH CONTRAST TECHNIQUE: Multidetector CT imaging of the chest, abdomen and pelvis was performed following the standard protocol during bolus administration of intravenous contrast. RADIATION DOSE REDUCTION: This exam was performed according to  the departmental dose-optimization program which includes automated exposure control, adjustment of the mA and/or kV according to patient size and/or use of iterative reconstruction technique. CONTRAST:  OMNIPAQUE IOHEXOL 300 MG/ML  SOLN COMPARISON:  Chest radiograph 05/05/2023 and abdominal ultrasound 12/04/2016 FINDINGS: CT CHEST FINDINGS Cardiovascular: Mild atheromatous vascular calcification of the aortic arch. Mild mitral valve calcification. Mediastinum/Nodes: Air-level in mildly dilated distal esophagus, possibly from dysmotility or reflux. Lungs/Pleura: Centrilobular and paraseptal emphysema. Hazy confluent central airspace opacity with faint nodular component most confluent in the right lower lobe but also present in the right upper lobe and right middle lobe, possibilities may include aspiration pneumonitis or multilobar pneumonia. Left lung is clear. No pleural effusion. Musculoskeletal: Unremarkable CT ABDOMEN PELVIS FINDINGS Hepatobiliary: Severe intrahepatic and extrahepatic biliary dilatation, common bile duct 1.6 cm in diameter, extending down to a pancreatic head mass. Mildly distended gallbladder without gallbladder wall thickening. No definite hepatic metastatic lesion identified. Pancreas: Dorsal pancreatic duct dilatation extending to the level of a pancreatic head mass. Mass characteristics: Size: 4.8 by 3.0 by 3.6 cm Location: Head and uncinate process Characterization: Solid Enhancement: Hypoenhancing Other Characteristics: The main pancreatic duct is distended. Local extent of mass: The mass substantially abuts the transverse duodenum. Vascular Involvement: The mass extends around the superior mesenteric artery, image 71 series 2. The mass abuts and substantially narrows the superior mesenteric vein on image 71 of series 2 with the upper margin of the mass abutting the confluence of the SMV and splenic vein. The posterior margin of the mass abuts the inferior vena cava for example on  image 72 of series 2. Variant hepatic artery anatomy: Conventional Bile Duct Involvement: Prominent dilatation of the biliary tree to the level of the mass. Variant biliary anatomy: Absent Adjacent Nodes: Porta hepatis node on image 68 series 2 measures 1.5 cm in short axis. Omental/Peritoneal Disease: Absent Distant Metastases: Nonspecific hypodense splenic lesion, cannot exclude metastatic lesion. Spleen: Centrally in the spleen a 1.6 by 1.5 cm lesion is observed, this is hypodense relative to the splenic parenchyma although not simple. While this could be a benign lesion such as hemangioma, metastatic pancreatic adenocarcinoma is not excluded. Adrenals/Urinary Tract: 1.4 by 1.8 cm left adrenal mass, relative washout of 48%, favoring adrenal adenoma. No further imaging workup of this lesion is indicated. The kidneys appear unremarkable. Urinary bladder unremarkable. Stomach/Bowel: Prominent stool throughout the colon favors constipation. No dilated bowel. Vascular/Lymphatic: Vascular encasement of the superior mesenteric artery by the tumor is noted above. The tumor abuts the SMV and  IVC. 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 Reproductive: Prostatomegaly. Other: No supplemental non-categorized findings. Musculoskeletal: Unremarkable IMPRESSION: 1. 4.8 cm solid hypoenhancing pancreatic head mass with vascular encasement of the superior mesenteric artery and abutment of the SMV and IVC. The mass substantially abuts the transverse duodenum. 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 2. Severe intrahepatic and extrahepatic biliary dilatation extending down to the level of the pancreatic mass. The dorsal pancreatic duct is also dilated. 3. 1.6 cm hypodense splenic lesion, cannot exclude metastatic pancreatic adenocarcinoma. 4. 1.8 cm left adrenal mass, relative washout of 48%, favoring adrenal adenoma. No further imaging workup of this lesion is indicated. 5. Hazy  confluent central airspace opacity with faint nodular component most confluent in the right lower lobe but also present in the right upper lobe and right middle lobe, possibilities may include aspiration pneumonitis or multilobar pneumonia. 6. Air-level in mildly dilated distal esophagus, possibly from dysmotility or reflux. 7. Prominent stool throughout the colon favors constipation. 8. Prostatomegaly. 9. 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 Aortic Atherosclerosis (ICD10-I70.0) and Emphysema (ICD10-J43.9). Electronically Signed   By: Gaylyn Rong M.D.   On: 05/05/2023 15:52   DG Chest Portable 1 View  Result Date: 05/05/2023 CLINICAL DATA:  Dyspnea EXAM: PORTABLE CHEST 1 VIEW COMPARISON:  03/11/2019 FINDINGS: No pneumothorax or effusion. Normal cardiopericardial silhouette. Consolidative opacity in the medial right lung base. Overlapping cardiac leads. Degenerative changes of the spine and pelvis. IMPRESSION: Ill-defined consolidative opacity in the medial right lung base. Acute infiltrates possible. Recommend follow-up to confirm clearance. Electronically Signed   By: Karen Kays M.D.   On: 05/05/2023 13:52     Labs:   Basic Metabolic Panel: Recent Labs  Lab 05/05/23 1335 05/06/23 0514 05/07/23 0509 05/08/23 0515  NA 125* 131* 131* 133*  K 4.1 3.8 3.8 3.8  CL 89* 100 100 101  CO2 23 18* 21* 23  GLUCOSE 114* 104* 104* 111*  BUN 21 14 15 15   CREATININE 0.46* <0.30* 0.34* <0.30*  CALCIUM 9.0 8.4* 8.3* 8.2*  MG  --   --  2.5*  --   PHOS  --   --  2.1*  --    GFR CrCl cannot be calculated (This lab value cannot be used to calculate CrCl because it is not a number: <0.30). Liver Function Tests: Recent Labs  Lab 05/05/23 1335 05/06/23 0514 05/07/23 0509 05/08/23 0515  AST 273* 247* 283* 364*  ALT 184* 158* 173* 195*  ALKPHOS 690* 557* 560* 545*  BILITOT 19.5* 16.8* 18.6* 21.0*  PROT 6.9 5.4* 5.2* 4.9*  ALBUMIN 2.8* 2.4* 2.1* 1.9*   No  results for input(s): "LIPASE", "AMYLASE" in the last 168 hours. Recent Labs  Lab 05/05/23 1335  AMMONIA 42*   Coagulation profile No results for input(s): "INR", "PROTIME" in the last 168 hours.  CBC: Recent Labs  Lab 05/05/23 1335 05/05/23 1653 05/06/23 0514 05/07/23 0509 05/08/23 0515  WBC 14.9* 12.2* 11.4* 9.0 7.7  NEUTROABS 8.9* 6.7 6.4 5.0 2.6  HGB 11.6* 9.1* 9.2* 8.3* 6.7*  HCT 34.0* 26.6* 27.5* 24.9* 20.0*  MCV 88.1 88.4 91.7 91.5 88.5  PLT 19* 15* 40* 14* 15*   Cardiac Enzymes: No results for input(s): "CKTOTAL", "CKMB", "CKMBINDEX", "TROPONINI" in the last 168 hours. BNP: Invalid input(s): "POCBNP" CBG: No results for input(s): "GLUCAP" in the last 168 hours. D-Dimer No results for input(s): "DDIMER" in the last 72 hours. Hgb A1c No  results for input(s): "HGBA1C" in the last 72 hours. Lipid Profile No results for input(s): "CHOL", "HDL", "LDLCALC", "TRIG", "CHOLHDL", "LDLDIRECT" in the last 72 hours. Thyroid function studies No results for input(s): "TSH", "T4TOTAL", "T3FREE", "THYROIDAB" in the last 72 hours.  Invalid input(s): "FREET3" Anemia work up Recent Labs    05/06/23 1633  RETICCTPCT 0.4   Microbiology Recent Results (from the past 240 hour(s))  Blood culture (routine x 2)     Status: None (Preliminary result)   Collection Time: 05/05/23  1:35 PM   Specimen: BLOOD  Result Value Ref Range Status   Specimen Description   Final    BLOOD LEFT ANTECUBITAL Performed at [facility], [address] [place], [place] [address]    Special Requests   Final    BOTTLES DRAWN AEROBIC AND ANAEROBIC Blood Culture adequate volume Performed at [facility] [address], [place], [place] [address]    Culture   Final    NO GROWTH 4 DAYS Performed at [facility], [address] [place], [place] [address]    Report Status PENDING  Incomplete  Resp panel by RT-PCR (RSV, Flu A&B, Covid) Anterior Nasal Swab     Status:  None   Collection Time: 05/05/23  2:46 PM   Specimen: Anterior Nasal Swab  Result Value Ref Range Status   SARS Coronavirus 2 by RT PCR NEGATIVE NEGATIVE Final    Comment: (NOTE) SARS-CoV-2 target nucleic acids are NOT DETECTED.  The SARS-CoV-2 RNA is generally detectable in upper respiratory specimens during the acute phase of infection. The lowest concentration of SARS-CoV-2 viral copies this assay can detect is 138 copies/mL. A negative result does not preclude SARS-Cov-2 infection and should not be used as the sole basis for treatment or other patient management decisions. A negative result may occur with  improper specimen collection/handling, submission of specimen other than nasopharyngeal swab, presence of viral mutation(s) within the areas targeted by this assay, and inadequate number of viral copies(<138 copies/mL). A negative result must be combined with clinical observations, patient history, and epidemiological information. The expected result is Negative.  Fact Sheet for Patients:  BloggerCourse.com  Fact Sheet for Healthcare Providers:  SeriousBroker.it  This test is no t yet approved or cleared by the [place] FDA and  has been authorized for detection and/or diagnosis of SARS-CoV-2 by FDA under an Emergency Use Authorization (EUA). This EUA will remain  in effect (meaning this test can be used) for the duration of the COVID-19 declaration under Section 564(b)(1) of the Act, 21 U.S.C.section 360bbb-3(b)(1), unless the authorization is terminated  or revoked sooner.       Influenza A by PCR NEGATIVE NEGATIVE Final   Influenza B by PCR NEGATIVE NEGATIVE Final    Comment: (NOTE) The Xpert Xpress SARS-CoV-2/FLU/RSV plus assay is intended as an aid in the diagnosis of influenza from Nasopharyngeal swab specimens and should not be used as a sole basis for treatment. Nasal washings and aspirates are unacceptable  for Xpert Xpress SARS-CoV-2/FLU/RSV testing.  Fact Sheet for Patients: BloggerCourse.com  Fact Sheet for Healthcare Providers: SeriousBroker.it  This test is not yet approved or cleared by the [place] FDA and has been authorized for detection and/or diagnosis of SARS-CoV-2 by FDA under an Emergency Use Authorization (EUA). This EUA will remain in effect (meaning this test can be used) for the duration of the COVID-19 declaration under Section 564(b)(1) of the Act, 21 U.S.C. section 360bbb-3(b)(1), unless the authorization  is terminated or revoked.     Resp Syncytial Virus by PCR NEGATIVE NEGATIVE Final    Comment: (NOTE) Fact Sheet for Patients: BloggerCourse.com  Fact Sheet for Healthcare Providers: SeriousBroker.it  This test is not yet approved or cleared by the [place] FDA and has been authorized for detection and/or diagnosis of SARS-CoV-2 by FDA under an Emergency Use Authorization (EUA). This EUA will remain in effect (meaning this test can be used) for the duration of the COVID-19 declaration under Section 564(b)(1) of the Act, 21 U.S.C. section 360bbb-3(b)(1), unless the authorization is terminated or revoked.  Performed at [facility], [address] [place], [place] [address]   Blood culture (routine x 2)     Status: None (Preliminary result)   Collection Time: 05/05/23  5:30 PM   Specimen: BLOOD  Result Value Ref Range Status   Specimen Description   Final    BLOOD LEFT ANTECUBITAL Performed at [facility], [address] [place], [place] [address]    Special Requests   Final    BOTTLES DRAWN AEROBIC AND ANAEROBIC Blood Culture results may not be optimal due to an excessive volume of blood received in culture bottles Performed at [facility], [address], [place], [place] [address]    Culture    Final    NO GROWTH 4 DAYS Performed at [facility], [address] [place], [place] [address]    Report Status PENDING  Incomplete    Time coordinating discharge: 25 minutes  Signed: Salam Chesterfield  [facility] 05/09/2023, 12:39 PM

## 2023-05-10 LAB — CULTURE, BLOOD (ROUTINE X 2)
Culture: NO GROWTH
Culture: NO GROWTH
Special Requests: ADEQUATE
